# Patient Record
Sex: Male | Born: 1960
Health system: Southern US, Community
[De-identification: ages and names within clinical notes are randomized; demographics above are authoritative.]

## PROBLEM LIST (undated history)

## (undated) DIAGNOSIS — Z860100 Personal history of colon polyps, unspecified: Secondary | ICD-10-CM

## (undated) DIAGNOSIS — R519 Headache, unspecified: Secondary | ICD-10-CM

## (undated) DIAGNOSIS — R51 Headache: Secondary | ICD-10-CM

## (undated) DIAGNOSIS — I10 Essential (primary) hypertension: Secondary | ICD-10-CM

## (undated) DIAGNOSIS — M4800 Spinal stenosis, site unspecified: Secondary | ICD-10-CM

## (undated) HISTORY — DX: Personal history of colonic polyps: Z86.010

## (undated) HISTORY — DX: Headache: R51

## (undated) HISTORY — DX: Spinal stenosis, site unspecified: M48.00

## (undated) HISTORY — DX: Essential (primary) hypertension: I10

## (undated) HISTORY — DX: Headache, unspecified: R51.9

## (undated) HISTORY — DX: Personal history of colon polyps, unspecified: Z86.0100

---

## 2012-04-16 DIAGNOSIS — G894 Chronic pain syndrome: Secondary | ICD-10-CM | POA: Insufficient documentation

## 2013-01-19 DIAGNOSIS — Z0000 Encounter for general adult medical examination without abnormal findings: Secondary | ICD-10-CM | POA: Insufficient documentation

## 2013-01-20 DIAGNOSIS — E875 Hyperkalemia: Secondary | ICD-10-CM | POA: Insufficient documentation

## 2013-01-20 DIAGNOSIS — N179 Acute kidney failure, unspecified: Secondary | ICD-10-CM | POA: Insufficient documentation

## 2013-02-22 DIAGNOSIS — M7989 Other specified soft tissue disorders: Secondary | ICD-10-CM | POA: Insufficient documentation

## 2013-07-29 HISTORY — PX: CHOLECYSTECTOMY: SHX55

## 2013-08-26 DIAGNOSIS — J060 Acute laryngopharyngitis: Secondary | ICD-10-CM | POA: Insufficient documentation

## 2013-08-30 LAB — HM COLONOSCOPY

## 2013-10-01 DIAGNOSIS — R499 Unspecified voice and resonance disorder: Secondary | ICD-10-CM | POA: Insufficient documentation

## 2013-10-01 DIAGNOSIS — J312 Chronic pharyngitis: Secondary | ICD-10-CM | POA: Insufficient documentation

## 2013-10-27 DIAGNOSIS — M542 Cervicalgia: Secondary | ICD-10-CM | POA: Insufficient documentation

## 2013-12-23 DIAGNOSIS — K819 Cholecystitis, unspecified: Secondary | ICD-10-CM | POA: Insufficient documentation

## 2014-09-22 ENCOUNTER — Ambulatory Visit: Payer: Self-pay | Admitting: Pain Medicine

## 2014-10-20 ENCOUNTER — Encounter: Payer: Self-pay | Admitting: Internal Medicine

## 2014-10-20 ENCOUNTER — Telehealth: Payer: Self-pay | Admitting: Internal Medicine

## 2014-10-20 ENCOUNTER — Ambulatory Visit (INDEPENDENT_AMBULATORY_CARE_PROVIDER_SITE_OTHER): Payer: Medicare Other | Admitting: Internal Medicine

## 2014-10-20 VITALS — BP 110/70 | HR 81 | Temp 97.7°F | Wt 176.0 lb

## 2014-10-20 DIAGNOSIS — R51 Headache: Secondary | ICD-10-CM | POA: Diagnosis not present

## 2014-10-20 DIAGNOSIS — M48061 Spinal stenosis, lumbar region without neurogenic claudication: Secondary | ICD-10-CM

## 2014-10-20 DIAGNOSIS — M4806 Spinal stenosis, lumbar region: Secondary | ICD-10-CM

## 2014-10-20 DIAGNOSIS — I10 Essential (primary) hypertension: Secondary | ICD-10-CM | POA: Insufficient documentation

## 2014-10-20 DIAGNOSIS — R519 Headache, unspecified: Secondary | ICD-10-CM

## 2014-10-20 DIAGNOSIS — G4486 Cervicogenic headache: Secondary | ICD-10-CM | POA: Insufficient documentation

## 2014-10-20 MED ORDER — MORPHINE SULFATE ER 60 MG PO TBCR: 60.0000 mg | EXTENDED_RELEASE_TABLET | Freq: Two times a day (BID) | ORAL | Status: DC

## 2014-10-20 MED ORDER — OXYCODONE HCL 10 MG PO TABS: 10.0000 mg | ORAL_TABLET | Freq: Four times a day (QID) | ORAL | Status: DC

## 2014-10-20 MED ORDER — MORPHINE SULFATE ER 15 MG PO TBCR: 15.0000 mg | EXTENDED_RELEASE_TABLET | Freq: Two times a day (BID) | ORAL | Status: DC

## 2014-10-20 NOTE — Telephone Encounter (Signed)
Call pt:  I reviewed his records from [facility] re: his pain management. There were no xrays or MRI's in his chart. Do I request them from [place] or did he have them done somewhere else?

## 2014-10-20 NOTE — Progress Notes (Signed)
Pre visit review using our clinic review tool, if applicable. No additional management support is needed unless otherwise documented below in the visit note. 

## 2014-10-20 NOTE — Telephone Encounter (Signed)
emmi emailed °

## 2014-10-20 NOTE — Telephone Encounter (Signed)
Ok will try to request from where all his other records are from

## 2014-10-20 NOTE — Telephone Encounter (Signed)
Pt states he had some diagnostic studies at [facility] and here [employer] [facility]

## 2014-10-20 NOTE — Assessment & Plan Note (Signed)
He has had this evaluated and nothing has been found He tells me he brought xrays of cervical spine and shoulder Will review records and consider additional workup if needed

## 2014-10-20 NOTE — Assessment & Plan Note (Addendum)
Chronic and disabling Will review all the records her brought with him today Will refer him to pain management Will refill his medication this month, because he will run out before we can get him his pain management appt Advised him I will not be refilling this at any other time. [place] controlled substance website reviewed- appropriate

## 2014-10-20 NOTE — Progress Notes (Signed)
HPI  Pt presents to the clinic today to establish care and for management of the conditions listed below. He recently moved here from [place], [place].   Flu: 03/2014 Tetanus: < 10 years ago PSA Screening: unsure Colon Screening: 2014 Vision Screening: > 1 year ago Dentist: as needed  Frequent Headaches: Occuring daily. The headaches start in the back of his head. The pain does not radiate. The pain is sharp and stabbing. The pain is worse with neck movement and left shoulder movement. He denies sensitivity to light, sound, nausea or vomiting. He does not currently take anything OTC for his headaches.  HTN: BP well controlled on Lisinopril. He denies medication side effects. He does monitor his BP at home and reports it is never > 120/90.  Spinal Stenosis: He does not know if this is related to an injury or not. He was in the [employer] for many years. He did have an injury during that time but no specific pain in his back. 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 He has had xrays and MRI's of his back. He did bring those records with him. He was evaluated by a neurologist who advised him that surgical intervention would not be successful. He is taking Morphine and Oxycodone. He would like to have his pain medication filled here if at all possible.  Past Medical History  Diagnosis Date  . History of colon polyps   . Frequent headaches   . Hypertension   . Spinal stenosis     Current Outpatient Prescriptions  Medication Sig Dispense Refill  . baclofen (LIORESAL) 10 MG tablet Take 10 mg by mouth 2 (two) times daily.    . Cholecalciferol (VITAMIN D3) 2000 UNITS TABS Take 1 capsule by mouth daily.    . Cholecalciferol (VITAMIN D3) 5000 UNITS CAPS Take 1 capsule by mouth daily.    . diclofenac (VOLTAREN) 50 MG EC tablet Take 50 mg by mouth 2 (two) times daily.   0  . gabapentin (NEURONTIN) 600 MG tablet Take 600 mg by mouth 3 (three) times daily.    2  . lisinopril (PRINIVIL,ZESTRIL) 20 MG tablet Take 20 mg by mouth daily.   1  . LORazepam (ATIVAN) 0.5 MG tablet Take 0.5 mg by mouth at bedtime. For muscle spasms/sleep    . morphine (MS CONTIN) 15 MG 12 hr tablet Take 15 mg by mouth every 12 (twelve) hours.   0  . morphine (MS CONTIN) 60 MG 12 hr tablet Take 60 mg by mouth every 12 (twelve) hours.   0  . Oxycodone HCl 10 MG TABS Take 10 mg by mouth every 6 (six) hours.   0   No current facility-administered medications for this visit.    Allergies  Allergen Reactions  . Bee Venom Swelling    As a kid--does not remember  . Poison Ivy Extract [Extract Of Poison Ivy] Swelling    Family History  Problem Relation Age of Onset  . Cancer Mother     breast  . Alcohol abuse Father   . Heart disease Father   . Heart disease Brother   . Hypertension Brother     History   Social History  . Marital Status: Married    Spouse Name: N/A  . Number of Children: N/A  . Years of Education: N/A   Occupational History  . Not on file.   Social History Main Topics  . Smoking status: Former Smoker  Quit date: 08/15/2011  . Smokeless tobacco: Never Used  . Alcohol Use: No     Comment: sober--since 2002  . Drug Use: Not on file  . Sexual Activity: Not on file   Other Topics Concern  . Not on file   Social History Narrative  . No narrative on file    ROS:  Constitutional: Pt reports headache. Denies fever, malaise, fatigue, or abrupt weight changes.  HEENT: Denies eye pain, eye redness, ear pain, ringing in the ears, wax buildup, runny nose, nasal congestion, bloody nose, or sore throat. Respiratory: Denies difficulty breathing, shortness of breath, cough or sputum production.   Cardiovascular: Denies chest pain, chest tightness, palpitations or swelling in the hands or feet.  Musculoskeletal: Pt reports low back pain. Denies muscle pain or joint pain and swelling.  Skin: Denies redness, rashes, lesions or ulcercations.   Neurological: Denies dizziness, difficulty with memory, difficulty with speech or problems with balance and coordination.   No other specific complaints in a complete review of systems (except as listed in HPI above).  PE:  BP 110/70 mmHg  Pulse 81  Temp(Src) 97.7 F (36.5 C) (Oral)  Wt 176 lb (79.833 kg)  SpO2 96% Wt Readings from Last 3 Encounters:  10/20/14 176 lb (79.833 kg)    General: Appears his  stated age, well developed, well nourished in NAD. HEENT: Head: normal shape and size; Eyes: sclera white, no icterus, conjunctiva pink, PERRLA and EOMs intact; Cardiovascular: Normal rate and rhythm. S1,S2 noted.  No murmur, rubs or gallops noted.  Pulmonary/Chest: Normal effort and positive vesicular breath sounds. No respiratory distress. No wheezes, rales or ronchi noted.  Musculoskeletal: Decreased flexion, extension and rotation of spine. Pain with palpation of the lumbar spine. Strength 4/5 BLE. Using cane for assistance with gait. Neurological: Alert and oriented. Sensation intact to BLE.   Assessment and Plan:

## 2014-10-20 NOTE — Patient Instructions (Signed)
Spinal Stenosis Spinal stenosis is an abnormal narrowing of the canals of your spine (vertebrae). CAUSES  Spinal stenosis is caused by areas of bone pushing into the central canals of your vertebrae. This condition can be present at birth (congenital). It also may be caused by arthritic deterioration of your vertebrae (spinal degeneration).  SYMPTOMS   Pain that is generally worse with activities, particularly standing and walking.  Numbness, tingling, hot or cold sensations, weakness, or weariness in your legs.  Frequent episodes of falling.  A foot-slapping gait that leads to muscle weakness. DIAGNOSIS  Spinal stenosis is diagnosed with the use of magnetic resonance imaging (MRI) or computed tomography (CT). TREATMENT  Initial therapy for spinal stenosis focuses on the management of the pain and other symptoms associated with the condition. These therapies include:  Practicing postural changes to lessen pressure on your nerves.  Exercises to strengthen the core of your body.  Loss of excess body weight.  The use of nonsteroidal anti-inflammatory medicines to reduce swelling and inflammation in your nerves. When therapies to manage pain are not successful, surgery to treat spinal stenosis may be recommended. This surgery involves removing excess bone, which puts pressure on your nerve roots. During this surgery (laminectomy), the posterior boney arch (lamina) and excess bone around the facet joints are removed. Document Released: 10/05/2003 Document Revised: 11/29/2013 Document Reviewed: 10/23/2012 Exit[employer] Patient Information 2015 [employer]. This information is not intended to replace advice given to you by your health care provider. Make sure you discuss any questions you have with your health care provider.  

## 2014-10-20 NOTE — Assessment & Plan Note (Signed)
I think his dose is too high He is not interested in reducing his dose at this time

## 2014-10-31 NOTE — Telephone Encounter (Signed)
Mel-  Do you have his medical records release or do we need him to sigh a new one.

## 2014-11-07 ENCOUNTER — Telehealth: Payer: Self-pay | Admitting: Internal Medicine

## 2014-11-07 NOTE — Telephone Encounter (Signed)
Pt called to let us know that his pain clinic appt is 4/19.

## 2014-11-15 DIAGNOSIS — M488X6 Other specified spondylopathies, lumbar region: Secondary | ICD-10-CM | POA: Diagnosis not present

## 2014-11-15 DIAGNOSIS — G894 Chronic pain syndrome: Secondary | ICD-10-CM | POA: Diagnosis not present

## 2014-11-15 DIAGNOSIS — M542 Cervicalgia: Secondary | ICD-10-CM | POA: Diagnosis not present

## 2014-11-15 DIAGNOSIS — M545 Low back pain: Secondary | ICD-10-CM | POA: Diagnosis not present

## 2014-11-15 DIAGNOSIS — Z79899 Other long term (current) drug therapy: Secondary | ICD-10-CM | POA: Diagnosis not present

## 2014-11-18 NOTE — Telephone Encounter (Signed)
Medical record release form has been faxed to [place] fx [phone]

## 2014-11-21 ENCOUNTER — Telehealth: Payer: Self-pay | Admitting: Internal Medicine

## 2014-11-21 NOTE — Telephone Encounter (Signed)
Noted, thank you, is there anything you need me to do?

## 2014-11-21 NOTE — Telephone Encounter (Signed)
Spoke to pt at length re: his pain clinic referral and [place] medicare complete insurance plan.  Pt states that Dr. Eula Listen at [facility] is not in the preferred network and he would like to be referred to Dr. Nathanial Rancher at [facility] in [place].  I explained that Pain referrals are our most challenging especially since he has already been to two different clinics.  He will bring in records from Dr. Baruch Merl office if he would like for me to move forward with a referral to a third pain clinic.  Although Dr. Consuela Mimes and [facility] are 2 clinics in his "preferred network" he does not want to be referred to either of these offices.  He is also a patient of record this year With Dr. Primus Bravo 08/2014 / lt

## 2014-11-29 DIAGNOSIS — Z79899 Other long term (current) drug therapy: Secondary | ICD-10-CM | POA: Diagnosis not present

## 2014-11-29 DIAGNOSIS — G894 Chronic pain syndrome: Secondary | ICD-10-CM | POA: Diagnosis not present

## 2014-12-05 ENCOUNTER — Telehealth: Payer: Self-pay

## 2014-12-05 NOTE — Telephone Encounter (Signed)
[name] with [facility] left v/m that pt called their office this morning and self released himself; pt had f/u appt on 12/12/14 and pt should have approx 1 week of pain med. [facility] will not write any more pain medications.

## 2014-12-05 NOTE — Telephone Encounter (Signed)
Noted! Thank you

## 2014-12-06 ENCOUNTER — Encounter: Payer: Self-pay | Admitting: Internal Medicine

## 2014-12-06 ENCOUNTER — Ambulatory Visit (INDEPENDENT_AMBULATORY_CARE_PROVIDER_SITE_OTHER): Payer: Medicare Other | Admitting: Internal Medicine

## 2014-12-06 VITALS — BP 130/78 | HR 99 | Temp 98.5°F | Wt 179.0 lb

## 2014-12-06 DIAGNOSIS — M4806 Spinal stenosis, lumbar region: Secondary | ICD-10-CM | POA: Diagnosis not present

## 2014-12-06 DIAGNOSIS — M48061 Spinal stenosis, lumbar region without neurogenic claudication: Secondary | ICD-10-CM

## 2014-12-06 NOTE — Patient Instructions (Signed)
Spinal Stenosis Spinal stenosis is an abnormal narrowing of the canals of your spine (vertebrae). CAUSES  Spinal stenosis is caused by areas of bone pushing into the central canals of your vertebrae. This condition can be present at birth (congenital). It also may be caused by arthritic deterioration of your vertebrae (spinal degeneration).  SYMPTOMS   Pain that is generally worse with activities, particularly standing and walking.  Numbness, tingling, hot or cold sensations, weakness, or weariness in your legs.  Frequent episodes of falling.  A foot-slapping gait that leads to muscle weakness. DIAGNOSIS  Spinal stenosis is diagnosed with the use of magnetic resonance imaging (MRI) or computed tomography (CT). TREATMENT  Initial therapy for spinal stenosis focuses on the management of the pain and other symptoms associated with the condition. These therapies include:  Practicing postural changes to lessen pressure on your nerves.  Exercises to strengthen the core of your body.  Loss of excess body weight.  The use of nonsteroidal anti-inflammatory medicines to reduce swelling and inflammation in your nerves. When therapies to manage pain are not successful, surgery to treat spinal stenosis may be recommended. This surgery involves removing excess bone, which puts pressure on your nerve roots. During this surgery (laminectomy), the posterior boney arch (lamina) and excess bone around the facet joints are removed. Document Released: 10/05/2003 Document Revised: 11/29/2013 Document Reviewed: 10/23/2012 Exit[employer] Patient Information 2015 [employer]. This information is not intended to replace advice given to you by your health care provider. Make sure you discuss any questions you have with your health care provider.  

## 2014-12-06 NOTE — Progress Notes (Signed)
Pre visit review using our clinic review tool, if applicable. No additional management support is needed unless otherwise documented below in the visit note. 

## 2014-12-06 NOTE — Assessment & Plan Note (Signed)
Advised him that I will not be refilling his pain medications We discussed "doctor shopping", and he understands that this is the last referral I will place for him Referral placed to Dr. Chancy Milroy in [place] will call you with the details He has already called [facility] and told them he did not want to be there patient anymore Advised him to try the Embeda and use the Percocet until he can get it with another pain specialist I still think he would benefit from another second opinion about surgery, but he declines any referral for this at this time

## 2014-12-06 NOTE — Progress Notes (Addendum)
Subjective:    Patient ID: Samuel Burke, male    DOB: 12-May-1961, 54 y.o.   MRN: [id number]  HPI  Pt presents to the clinic today to discuss his pain management. He was referred to [facility] 09/[facility]. He had an appt with Dr. Andree Elk on 11/15/14. He reports that Dr. Andree Elk, stopped his Morphine and Oxycontin, because he felt like he was taking too much medication, and started him on Embeda and Percocet. He reports he never even picked up the Embeda and the Percocet is not effective at all. He has continued to take leftover Oxycontin and Morphine during this time and reports he will run out of those medications tomorrow. He wants a referral to a new pain mangement specialist. He also wants me to tell the new pain management specialist that he needs to stay on Morphine and Oxycontin because nothing else will work for him. He also wants me to refill his Morphine and Oxycontin today.He also tells me that he has been to 2 different surgeons in [facility], and that they both told him that surgery would not be effective in his case. Of note, I did try to request his xrays and MRI from his previous provider in [place], and they faxed back that they had no medical information on this pt. He reports that the pain is so bad at times, that he would rather be dead. He is not actively suicidal and has not come up with any plans on how he would commit suicide.  Review of Systems      Past Medical History  Diagnosis Date  . History of colon polyps   . Frequent headaches   . Hypertension   . Spinal stenosis     Current Outpatient Prescriptions  Medication Sig Dispense Refill  . baclofen (LIORESAL) 10 MG tablet Take 10 mg by mouth 2 (two) times daily.    . Cholecalciferol (VITAMIN D3) 2000 UNITS TABS Take 1 capsule by mouth daily.    . Cholecalciferol (VITAMIN D3) 5000 UNITS CAPS Take 1 capsule by mouth daily.    . diclofenac (VOLTAREN) 50 MG EC tablet Take 50 mg by mouth 2 (two) times daily.   0   . gabapentin (NEURONTIN) 600 MG tablet Take 600 mg by mouth 3 (three) times daily.   2  . lisinopril (PRINIVIL,ZESTRIL) 20 MG tablet Take 20 mg by mouth daily.   1  . LORazepam (ATIVAN) 0.5 MG tablet Take 0.5 mg by mouth at bedtime. For muscle spasms/sleep    . morphine (MS CONTIN) 15 MG 12 hr tablet Take 1 tablet (15 mg total) by mouth every 12 (twelve) hours. 60 tablet 0  . morphine (MS CONTIN) 60 MG 12 hr tablet Take 1 tablet (60 mg total) by mouth every 12 (twelve) hours. 60 tablet 0  . Oxycodone HCl 10 MG TABS Take 1 tablet (10 mg total) by mouth every 6 (six) hours. 120 tablet 0  . oxyCODONE-acetaminophen (PERCOCET) 10-325 MG per tablet Take 1 tablet by mouth 4 (four) times daily.  0  . Morphine-Naltrexone 60-2.4 MG CPCR Take 1 tablet by mouth 2 (two) times daily.     No current facility-administered medications for this visit.    Allergies  Allergen Reactions  . Bee Venom Swelling    As a kid--does not remember  . Poison Ivy Extract [Extract Of Poison Ivy] Swelling    Family History  Problem Relation Age of Onset  . Cancer Mother     breast  .  Alcohol abuse Father   . Heart disease Father   . Heart disease Brother   . Hypertension Brother     History   Social History  . Marital Status: Married    Spouse Name: N/A  . Number of Children: N/A  . Years of Education: N/A   Occupational History  . Not on file.   Social History Main Topics  . Smoking status: Former Smoker    Quit date: 08/15/2011  . Smokeless tobacco: Never Used  . Alcohol Use: No     Comment: sober--since 2002  . Drug Use: No  . Sexual Activity: Not Currently   Other Topics Concern  . Not on file   Social History Narrative     Constitutional: Denies fever, malaise, fatigue, headache or abrupt weight changes.  Respiratory: Denies difficulty breathing, shortness of breath, cough or sputum production.   Cardiovascular: Denies chest pain, chest tightness, palpitations or swelling in the hands  or feet.  Gastrointestinal: Denies abdominal pain, bloating, constipation, diarrhea or blood in the stool.  Musculoskeletal: Pt reports low back pain. Denies muscle pain or joint pain and swelling.  Neurological: Denies dizziness, difficulty with memory, difficulty with speech or problems with balance and coordination.  Psych: Denies anxiety, depression, SI/HI.  No other specific complaints in a complete review of systems (except as listed in HPI above).  Objective:   Physical Exam  BP 130/78 mmHg  Pulse 99  Temp(Src) 98.5 F (36.9 C) (Oral)  Wt 179 lb (81.194 kg)  SpO2 98% Wt Readings from Last 3 Encounters:  12/06/14 179 lb (81.194 kg)  10/20/14 176 lb (79.833 kg)    General: Appears his stated age, well developed, well nourished in NAD. Cardiovascular: Normal rate and rhythm. S1,S2 noted.  No murmur, rubs or gallops noted.  Pulmonary/Chest: Normal effort and positive vesicular breath sounds. No respiratory distress. No wheezes, rales or ronchi noted.  Musculoskeletal: Decreased flexion, extension and rotation of spine. Pain with palpation of the lumbar spine. Strength 4/5 BLE. Using cane for assistance with gait. Neurological: Alert and oriented. Sensation intact to BLE. Psychiatric: Mood and affect normal. Behavior is normal. Judgment and thought content normal.         Assessment & Plan:

## 2014-12-19 DIAGNOSIS — G8929 Other chronic pain: Secondary | ICD-10-CM | POA: Diagnosis not present

## 2014-12-19 DIAGNOSIS — F1120 Opioid dependence, uncomplicated: Secondary | ICD-10-CM | POA: Diagnosis not present

## 2014-12-19 DIAGNOSIS — M5440 Lumbago with sciatica, unspecified side: Secondary | ICD-10-CM | POA: Diagnosis not present

## 2014-12-19 DIAGNOSIS — G894 Chronic pain syndrome: Secondary | ICD-10-CM | POA: Diagnosis not present

## 2014-12-19 DIAGNOSIS — M542 Cervicalgia: Secondary | ICD-10-CM | POA: Diagnosis not present

## 2015-01-23 DIAGNOSIS — M5440 Lumbago with sciatica, unspecified side: Secondary | ICD-10-CM | POA: Diagnosis not present

## 2015-01-23 DIAGNOSIS — G894 Chronic pain syndrome: Secondary | ICD-10-CM | POA: Diagnosis not present

## 2015-01-23 DIAGNOSIS — G8929 Other chronic pain: Secondary | ICD-10-CM | POA: Diagnosis not present

## 2015-01-23 DIAGNOSIS — F1120 Opioid dependence, uncomplicated: Secondary | ICD-10-CM | POA: Diagnosis not present

## 2015-01-23 DIAGNOSIS — Z79899 Other long term (current) drug therapy: Secondary | ICD-10-CM | POA: Diagnosis not present

## 2015-01-23 DIAGNOSIS — M542 Cervicalgia: Secondary | ICD-10-CM | POA: Diagnosis not present

## 2015-01-27 ENCOUNTER — Emergency Department: Payer: Medicare Other

## 2015-01-27 ENCOUNTER — Inpatient Hospital Stay
Admission: EM | Admit: 2015-01-27 | Discharge: 2015-01-31 | DRG: 563 | Disposition: A | Discharge status: Skilled Nursing Facility | Payer: Medicare Other | Attending: Internal Medicine | Admitting: Internal Medicine

## 2015-01-27 ENCOUNTER — Inpatient Hospital Stay: Payer: Medicare Other

## 2015-01-27 ENCOUNTER — Encounter: Payer: Self-pay | Admitting: Emergency Medicine

## 2015-01-27 DIAGNOSIS — S92341A Displaced fracture of fourth metatarsal bone, right foot, initial encounter for closed fracture: Secondary | ICD-10-CM | POA: Diagnosis not present

## 2015-01-27 DIAGNOSIS — M8008XA Age-related osteoporosis with current pathological fracture, vertebra(e), initial encounter for fracture: Secondary | ICD-10-CM | POA: Diagnosis present

## 2015-01-27 DIAGNOSIS — S92321A Displaced fracture of second metatarsal bone, right foot, initial encounter for closed fracture: Secondary | ICD-10-CM | POA: Diagnosis not present

## 2015-01-27 DIAGNOSIS — G629 Polyneuropathy, unspecified: Secondary | ICD-10-CM | POA: Diagnosis not present

## 2015-01-27 DIAGNOSIS — S92322A Displaced fracture of second metatarsal bone, left foot, initial encounter for closed fracture: Secondary | ICD-10-CM | POA: Diagnosis not present

## 2015-01-27 DIAGNOSIS — W06XXXA Fall from bed, initial encounter: Secondary | ICD-10-CM | POA: Diagnosis present

## 2015-01-27 DIAGNOSIS — M6282 Rhabdomyolysis: Secondary | ICD-10-CM | POA: Diagnosis present

## 2015-01-27 DIAGNOSIS — F419 Anxiety disorder, unspecified: Secondary | ICD-10-CM | POA: Diagnosis present

## 2015-01-27 DIAGNOSIS — M4800 Spinal stenosis, site unspecified: Secondary | ICD-10-CM | POA: Diagnosis present

## 2015-01-27 DIAGNOSIS — S32048A Other fracture of fourth lumbar vertebra, initial encounter for closed fracture: Secondary | ICD-10-CM | POA: Diagnosis not present

## 2015-01-27 DIAGNOSIS — S92342D Displaced fracture of fourth metatarsal bone, left foot, subsequent encounter for fracture with routine healing: Secondary | ICD-10-CM | POA: Diagnosis not present

## 2015-01-27 DIAGNOSIS — R29898 Other symptoms and signs involving the musculoskeletal system: Secondary | ICD-10-CM | POA: Diagnosis present

## 2015-01-27 DIAGNOSIS — S92312A Displaced fracture of first metatarsal bone, left foot, initial encounter for closed fracture: Secondary | ICD-10-CM | POA: Diagnosis not present

## 2015-01-27 DIAGNOSIS — R296 Repeated falls: Secondary | ICD-10-CM | POA: Diagnosis present

## 2015-01-27 DIAGNOSIS — Z87891 Personal history of nicotine dependence: Secondary | ICD-10-CM | POA: Diagnosis not present

## 2015-01-27 DIAGNOSIS — R278 Other lack of coordination: Secondary | ICD-10-CM | POA: Diagnosis not present

## 2015-01-27 DIAGNOSIS — I10 Essential (primary) hypertension: Secondary | ICD-10-CM | POA: Diagnosis present

## 2015-01-27 DIAGNOSIS — Z808 Family history of malignant neoplasm of other organs or systems: Secondary | ICD-10-CM | POA: Diagnosis not present

## 2015-01-27 DIAGNOSIS — N179 Acute kidney failure, unspecified: Secondary | ICD-10-CM | POA: Diagnosis present

## 2015-01-27 DIAGNOSIS — T796XXA Traumatic ischemia of muscle, initial encounter: Secondary | ICD-10-CM | POA: Diagnosis not present

## 2015-01-27 DIAGNOSIS — M4806 Spinal stenosis, lumbar region: Secondary | ICD-10-CM | POA: Diagnosis not present

## 2015-01-27 DIAGNOSIS — Z9049 Acquired absence of other specified parts of digestive tract: Secondary | ICD-10-CM | POA: Diagnosis not present

## 2015-01-27 DIAGNOSIS — S92324A Nondisplaced fracture of second metatarsal bone, right foot, initial encounter for closed fracture: Secondary | ICD-10-CM | POA: Diagnosis not present

## 2015-01-27 DIAGNOSIS — Z91030 Bee allergy status: Secondary | ICD-10-CM | POA: Diagnosis not present

## 2015-01-27 DIAGNOSIS — S199XXA Unspecified injury of neck, initial encounter: Secondary | ICD-10-CM | POA: Diagnosis not present

## 2015-01-27 DIAGNOSIS — Z809 Family history of malignant neoplasm, unspecified: Secondary | ICD-10-CM

## 2015-01-27 DIAGNOSIS — Z8249 Family history of ischemic heart disease and other diseases of the circulatory system: Secondary | ICD-10-CM | POA: Diagnosis not present

## 2015-01-27 DIAGNOSIS — S92322D Displaced fracture of second metatarsal bone, left foot, subsequent encounter for fracture with routine healing: Secondary | ICD-10-CM | POA: Diagnosis not present

## 2015-01-27 DIAGNOSIS — S3992XA Unspecified injury of lower back, initial encounter: Secondary | ICD-10-CM | POA: Diagnosis not present

## 2015-01-27 DIAGNOSIS — S92331A Displaced fracture of third metatarsal bone, right foot, initial encounter for closed fracture: Secondary | ICD-10-CM | POA: Diagnosis not present

## 2015-01-27 DIAGNOSIS — S92332A Displaced fracture of third metatarsal bone, left foot, initial encounter for closed fracture: Secondary | ICD-10-CM | POA: Diagnosis not present

## 2015-01-27 DIAGNOSIS — S92342A Displaced fracture of fourth metatarsal bone, left foot, initial encounter for closed fracture: Secondary | ICD-10-CM | POA: Diagnosis present

## 2015-01-27 DIAGNOSIS — R51 Headache: Secondary | ICD-10-CM | POA: Diagnosis present

## 2015-01-27 DIAGNOSIS — S92902A Unspecified fracture of left foot, initial encounter for closed fracture: Secondary | ICD-10-CM

## 2015-01-27 DIAGNOSIS — Z803 Family history of malignant neoplasm of breast: Secondary | ICD-10-CM

## 2015-01-27 DIAGNOSIS — S92344A Nondisplaced fracture of fourth metatarsal bone, right foot, initial encounter for closed fracture: Secondary | ICD-10-CM | POA: Diagnosis not present

## 2015-01-27 DIAGNOSIS — Z86010 Personal history of colonic polyps: Secondary | ICD-10-CM | POA: Diagnosis not present

## 2015-01-27 DIAGNOSIS — S32048D Other fracture of fourth lumbar vertebra, subsequent encounter for fracture with routine healing: Secondary | ICD-10-CM | POA: Diagnosis not present

## 2015-01-27 DIAGNOSIS — W1830XA Fall on same level, unspecified, initial encounter: Secondary | ICD-10-CM | POA: Diagnosis present

## 2015-01-27 DIAGNOSIS — S92332D Displaced fracture of third metatarsal bone, left foot, subsequent encounter for fracture with routine healing: Secondary | ICD-10-CM | POA: Diagnosis not present

## 2015-01-27 DIAGNOSIS — S22088A Other fracture of T11-T12 vertebra, initial encounter for closed fracture: Secondary | ICD-10-CM | POA: Diagnosis not present

## 2015-01-27 DIAGNOSIS — S92909A Unspecified fracture of unspecified foot, initial encounter for closed fracture: Secondary | ICD-10-CM | POA: Diagnosis not present

## 2015-01-27 DIAGNOSIS — Z811 Family history of alcohol abuse and dependence: Secondary | ICD-10-CM

## 2015-01-27 DIAGNOSIS — S0990XA Unspecified injury of head, initial encounter: Secondary | ICD-10-CM | POA: Diagnosis not present

## 2015-01-27 DIAGNOSIS — M545 Low back pain: Secondary | ICD-10-CM | POA: Diagnosis not present

## 2015-01-27 DIAGNOSIS — S22080A Wedge compression fracture of T11-T12 vertebra, initial encounter for closed fracture: Secondary | ICD-10-CM | POA: Diagnosis not present

## 2015-01-27 DIAGNOSIS — S299XXA Unspecified injury of thorax, initial encounter: Secondary | ICD-10-CM | POA: Diagnosis not present

## 2015-01-27 DIAGNOSIS — R220 Localized swelling, mass and lump, head: Secondary | ICD-10-CM | POA: Diagnosis not present

## 2015-01-27 DIAGNOSIS — Z9181 History of falling: Secondary | ICD-10-CM | POA: Diagnosis not present

## 2015-01-27 DIAGNOSIS — Z79891 Long term (current) use of opiate analgesic: Secondary | ICD-10-CM | POA: Diagnosis not present

## 2015-01-27 DIAGNOSIS — M6281 Muscle weakness (generalized): Secondary | ICD-10-CM | POA: Diagnosis not present

## 2015-01-27 DIAGNOSIS — F1120 Opioid dependence, uncomplicated: Secondary | ICD-10-CM | POA: Diagnosis not present

## 2015-01-27 DIAGNOSIS — Y92003 Bedroom of unspecified non-institutional (private) residence as the place of occurrence of the external cause: Secondary | ICD-10-CM

## 2015-01-27 DIAGNOSIS — S92325A Nondisplaced fracture of second metatarsal bone, left foot, initial encounter for closed fracture: Secondary | ICD-10-CM | POA: Diagnosis not present

## 2015-01-27 DIAGNOSIS — S93325A Dislocation of tarsometatarsal joint of left foot, initial encounter: Secondary | ICD-10-CM

## 2015-01-27 DIAGNOSIS — M4850XA Collapsed vertebra, not elsewhere classified, site unspecified, initial encounter for fracture: Secondary | ICD-10-CM | POA: Diagnosis not present

## 2015-01-27 DIAGNOSIS — M62838 Other muscle spasm: Secondary | ICD-10-CM | POA: Diagnosis not present

## 2015-01-27 DIAGNOSIS — T148 Other injury of unspecified body region: Secondary | ICD-10-CM | POA: Diagnosis not present

## 2015-01-27 LAB — CBC WITH DIFFERENTIAL/PLATELET
BASOS ABS: 0.1 10*3/uL (ref 0–0.1)
Basophils Relative: 1 %
EOS ABS: 0 10*3/uL (ref 0–0.7)
EOS PCT: 0 %
HEMATOCRIT: 40.1 % (ref 40.0–52.0)
HEMOGLOBIN: 13.4 g/dL (ref 13.0–18.0)
LYMPHS ABS: 1.4 10*3/uL (ref 1.0–3.6)
LYMPHS PCT: 9 %
MCH: 30.5 pg (ref 26.0–34.0)
MCHC: 33.4 g/dL (ref 32.0–36.0)
MCV: 91.1 fL (ref 80.0–100.0)
Monocytes Absolute: 1 10*3/uL (ref 0.2–1.0)
Monocytes Relative: 6 %
NEUTROS ABS: 13.1 10*3/uL — AB (ref 1.4–6.5)
NEUTROS PCT: 84 %
PLATELETS: 196 10*3/uL (ref 150–440)
RBC: 4.4 MIL/uL (ref 4.40–5.90)
RDW: 13.1 % (ref 11.5–14.5)
WBC: 15.6 10*3/uL — ABNORMAL HIGH (ref 3.8–10.6)

## 2015-01-27 LAB — URINE DRUG SCREEN, QUALITATIVE (ARMC ONLY)
Amphetamines, Ur Screen: NOT DETECTED
Barbiturates, Ur Screen: NOT DETECTED
Benzodiazepine, Ur Scrn: NOT DETECTED
CANNABINOID 50 NG, UR ~~LOC~~: NOT DETECTED
Cocaine Metabolite,Ur ~~LOC~~: NOT DETECTED
MDMA (Ecstasy)Ur Screen: NOT DETECTED
Methadone Scn, Ur: NOT DETECTED
OPIATE, UR SCREEN: POSITIVE — AB
PHENCYCLIDINE (PCP) UR S: NOT DETECTED
TRICYCLIC, UR SCREEN: NOT DETECTED

## 2015-01-27 LAB — TYPE AND SCREEN
ABO/RH(D): O POS
Antibody Screen: NEGATIVE

## 2015-01-27 LAB — SEDIMENTATION RATE: Sed Rate: 17 mm/hr (ref 0–20)

## 2015-01-27 LAB — ABO/RH: ABO/RH(D): O POS

## 2015-01-27 LAB — BASIC METABOLIC PANEL
Anion gap: 9 (ref 5–15)
BUN: 37 mg/dL — ABNORMAL HIGH (ref 6–20)
CALCIUM: 9.6 mg/dL (ref 8.9–10.3)
CO2: 25 mmol/L (ref 22–32)
CREATININE: 1.97 mg/dL — AB (ref 0.61–1.24)
Chloride: 103 mmol/L (ref 101–111)
GFR calc Af Amer: 42 mL/min — ABNORMAL LOW (ref >60)
GFR, EST NON AFRICAN AMERICAN: 36 mL/min — AB (ref >60)
GLUCOSE: 111 mg/dL — AB (ref 65–99)
POTASSIUM: 5.5 mmol/L — AB (ref 3.5–5.1)
SODIUM: 137 mmol/L (ref 135–145)

## 2015-01-27 LAB — TSH: TSH: 0.475 u[IU]/mL (ref 0.350–4.500)

## 2015-01-27 LAB — CK: Total CK: 7214 U/L — ABNORMAL HIGH (ref 49–397)

## 2015-01-27 MED ORDER — ONDANSETRON HCL 4 MG/2ML IJ SOLN
INTRAMUSCULAR | Status: AC
  Administered 2015-01-27: 4 mg via INTRAVENOUS
  Filled 2015-01-27: qty 2

## 2015-01-27 MED ORDER — TETANUS-DIPHTHERIA TOXOIDS TD 5-2 LFU IM INJ
INJECTION | INTRAMUSCULAR | Status: AC
  Administered 2015-01-27: 0.5 mL via INTRAMUSCULAR
  Filled 2015-01-27: qty 0.5

## 2015-01-27 MED ORDER — ONDANSETRON HCL 4 MG PO TABS: 4.0000 mg | ORAL_TABLET | Freq: Four times a day (QID) | ORAL | Status: DC | PRN

## 2015-01-27 MED ORDER — SODIUM CHLORIDE 0.9 % IV BOLUS (SEPSIS)
1000.0000 mL | Freq: Once | INTRAVENOUS | Status: AC
  Administered 2015-01-27: 1000 mL via INTRAVENOUS

## 2015-01-27 MED ORDER — MORPHINE SULFATE ER 30 MG PO TBCR
60.0000 mg | EXTENDED_RELEASE_TABLET | Freq: Two times a day (BID) | ORAL | Status: DC
  Administered 2015-01-27 – 2015-01-28 (×2): 60 mg via ORAL
  Filled 2015-01-27 (×2): qty 2

## 2015-01-27 MED ORDER — VITAMIN D3 25 MCG (1000 UNIT) PO TABS
5000.0000 [IU] | ORAL_TABLET | Freq: Every day | ORAL | Status: DC
  Administered 2015-01-28 – 2015-01-31 (×4): 5000 [IU] via ORAL
  Filled 2015-01-27 (×10): qty 5

## 2015-01-27 MED ORDER — LISINOPRIL 20 MG PO TABS
20.0000 mg | ORAL_TABLET | Freq: Every day | ORAL | Status: DC
Start: 2015-01-27 — End: 2015-01-27

## 2015-01-27 MED ORDER — ONDANSETRON HCL 4 MG/2ML IJ SOLN
4.0000 mg | Freq: Four times a day (QID) | INTRAMUSCULAR | Status: DC | PRN
  Administered 2015-01-27 – 2015-01-28 (×3): 4 mg via INTRAVENOUS
  Filled 2015-01-27 (×2): qty 2

## 2015-01-27 MED ORDER — VITAMIN D 1000 UNITS PO TABS
2000.0000 [IU] | ORAL_TABLET | Freq: Every day | ORAL | Status: DC
  Administered 2015-01-28 – 2015-01-31 (×4): 2000 [IU] via ORAL
  Filled 2015-01-27 (×7): qty 2

## 2015-01-27 MED ORDER — SODIUM CHLORIDE 0.9 % IV SOLN
INTRAVENOUS | Status: DC
  Administered 2015-01-27 – 2015-01-29 (×5): via INTRAVENOUS

## 2015-01-27 MED ORDER — GABAPENTIN 600 MG PO TABS
600.0000 mg | ORAL_TABLET | Freq: Three times a day (TID) | ORAL | Status: DC
  Administered 2015-01-27 – 2015-01-31 (×11): 600 mg via ORAL
  Filled 2015-01-27 (×12): qty 1

## 2015-01-27 MED ORDER — MORPHINE SULFATE 4 MG/ML IJ SOLN
4.0000 mg | Freq: Once | INTRAMUSCULAR | Status: AC
Start: 2015-01-27 — End: 2015-01-27
  Administered 2015-01-27: 4 mg via INTRAVENOUS

## 2015-01-27 MED ORDER — ONDANSETRON HCL 4 MG/2ML IJ SOLN
4.0000 mg | Freq: Once | INTRAMUSCULAR | Status: AC
  Administered 2015-01-27: 4 mg via INTRAVENOUS

## 2015-01-27 MED ORDER — BACLOFEN 10 MG PO TABS
10.0000 mg | ORAL_TABLET | Freq: Two times a day (BID) | ORAL | Status: DC
Start: 2015-01-27 — End: 2015-01-31
  Administered 2015-01-27 – 2015-01-31 (×8): 10 mg via ORAL
  Filled 2015-01-27 (×8): qty 1

## 2015-01-27 MED ORDER — ENOXAPARIN SODIUM 40 MG/0.4ML ~~LOC~~ SOLN
40.0000 mg | SUBCUTANEOUS | Status: DC
  Administered 2015-01-27 – 2015-01-30 (×4): 40 mg via SUBCUTANEOUS
  Filled 2015-01-27 (×4): qty 0.4

## 2015-01-27 MED ORDER — MORPHINE SULFATE 4 MG/ML IJ SOLN
INTRAMUSCULAR | Status: AC
  Administered 2015-01-27: 4 mg via INTRAVENOUS
  Filled 2015-01-27: qty 1

## 2015-01-27 MED ORDER — OXYCODONE HCL 5 MG PO TABS
10.0000 mg | ORAL_TABLET | Freq: Four times a day (QID) | ORAL | Status: DC
  Filled 2015-01-27: qty 2

## 2015-01-27 MED ORDER — LORAZEPAM 2 MG/ML IJ SOLN
INTRAMUSCULAR | Status: AC
  Filled 2015-01-27: qty 1

## 2015-01-27 MED ORDER — LORAZEPAM 0.5 MG PO TABS
0.5000 mg | ORAL_TABLET | Freq: Every evening | ORAL | Status: DC | PRN
Start: 2015-01-27 — End: 2015-01-31

## 2015-01-27 MED ORDER — TETANUS-DIPHTHERIA TOXOIDS TD 5-2 LFU IM INJ
0.5000 mL | INJECTION | Freq: Once | INTRAMUSCULAR | Status: AC
  Administered 2015-01-27: 0.5 mL via INTRAMUSCULAR

## 2015-01-27 MED ORDER — LORAZEPAM 2 MG/ML IJ SOLN: 1.0000 mg | Freq: Once | INTRAMUSCULAR | Status: DC

## 2015-01-27 NOTE — ED Provider Notes (Signed)
[facility] Emergency Department Provider Note  ____________________________________________  Time seen: Upon arrival to the emergency department.  I have reviewed the triage vital signs and the nursing notes.   HISTORY  Chief Complaint Fall    HPI Samuel Burke is a 54 y.o. male with a history of spinal stenosis who says he had multiple falls last night because of legs giving out on him. He says that he was not able to get up and eventually had to pull himself over the phone which he used call 911. Sustained several abrasions to the forehead. Denies any loss of bowel or bladder continence. Says does have chronic weakness to his bilateral lower extremities but has been worse since last night. Also now with increased pain in the midline T-spine. Sees pain control as an outpatient.   Past Medical History  Diagnosis Date  . History of colon polyps   . Frequent headaches   . Hypertension   . Spinal stenosis     Patient Active Problem List   Diagnosis Date Noted  . Frequent headaches 10/20/2014  . Essential hypertension 10/20/2014  . Spinal stenosis of lumbar region 10/20/2014    Past Surgical History  Procedure Laterality Date  . Cholecystectomy  2015    Current Outpatient Rx  Name  Route  Sig  Dispense  Refill  . baclofen (LIORESAL) 10 MG tablet   Oral   Take 10 mg by mouth 2 (two) times daily.         . Cholecalciferol (RA VITAMIN D-3) 2000 UNITS CAPS   Oral   Take 1 capsule by mouth daily. Pt takes with a 5,000 unit capsule.         . Cholecalciferol (VITAMIN D3) 5000 UNITS CAPS   Oral   Take 1 capsule by mouth daily. Pt takes with a 2,000 unit capsule.         . diclofenac (VOLTAREN) 50 MG EC tablet   Oral   Take 50 mg by mouth 2 (two) times daily.         Marland Kitchen gabapentin (NEURONTIN) 600 MG tablet   Oral   Take 600 mg by mouth 3 (three) times daily.         Marland Kitchen lisinopril (PRINIVIL,ZESTRIL) 20 MG tablet   Oral   Take 20 mg  by mouth at bedtime.         Marland Kitchen morphine (MS CONTIN) 60 MG 12 hr tablet   Oral   Take 1 tablet (60 mg total) by mouth every 12 (twelve) hours. Patient taking differently: Take 60 mg by mouth every 8 (eight) hours.    60 tablet   0     Fill on or after 10/31/14   . LORazepam (ATIVAN) 0.5 MG tablet   Oral   Take 0.5 mg by mouth at bedtime as needed for sleep.          Marland Kitchen morphine (MS CONTIN) 15 MG 12 hr tablet   Oral   Take 1 tablet (15 mg total) by mouth every 12 (twelve) hours. Patient not taking: Reported on 01/27/2015   60 tablet   0     Fill on or after 10/31/14   . Oxycodone HCl 10 MG TABS   Oral   Take 1 tablet (10 mg total) by mouth every 6 (six) hours. Patient not taking: Reported on 01/27/2015   120 tablet   0     Fill on or after 10/31/14     Allergies Bee venom  and Poison ivy extract  Family History  Problem Relation Age of Onset  . Cancer Mother     breast  . Alcohol abuse Father   . Heart disease Father   . Heart disease Brother   . Hypertension Brother     Social History History  Substance Use Topics  . Smoking status: Former Smoker    Quit date: 08/15/2011  . Smokeless tobacco: Never Used  . Alcohol Use: No     Comment: sober--since 2002    Review of Systems Constitutional: No fever/chills Eyes: No visual changes. ENT: No sore throat. Cardiovascular: Denies chest pain. Respiratory: Denies shortness of breath. Gastrointestinal: No abdominal pain.  No nausea, no vomiting.  No diarrhea.  No constipation. Genitourinary: Negative for dysuria. Musculoskeletal: As above  Skin: Negative for rash. Neurological: Negative for headaches or numbness.  10-point ROS otherwise negative.  ____________________________________________   PHYSICAL EXAM:  VITAL SIGNS: ED Triage Vitals  Enc Vitals Group     BP 01/27/15 1107 126/95 mmHg     Pulse Rate 01/27/15 1107 114     Resp 01/27/15 1107 16     Temp 01/27/15 1107 98.2 F (36.8 C)     Temp Source  01/27/15 1107 Oral     SpO2 01/27/15 1107 97 %     Weight 01/27/15 1107 174 lb (78.926 kg)     Height 01/27/15 1107 5\' 6"  (1.676 m)     Head Cir --      Peak Flow --      Pain Score 01/27/15 1108 10     Pain Loc --      Pain Edu? --      Excl. in [place]? --     Constitutional: Alert and oriented.  in no acute distress. Eyes: Conjunctivae are normal. PERRL. EOMI. Head: Atraumatic. Nose: No congestion/rhinnorhea. Mouth/Throat: Mucous membranes are moist.  Oropharynx non-erythematous. Neck: No stridor.  In c-collar and on backboard. No tenderness to the C-spine. No step-off or deformity. Cardiovascular: Normal rate, regular rhythm. Grossly normal heart sounds.  Good peripheral circulation. Respiratory: Normal respiratory effort.  No retractions. Lungs CTAB. Gastrointestinal: Soft and nontender. No distention. No abdominal bruits. No CVA tenderness. Musculoskeletal: No lower extremity tenderness nor edema.  No joint effusions. Left forefoot with ecchymosis and mild edema. No saddle anesthesia. There is no step-off or deformity to the T or L-spine. There is tenderness diffusely midline to the T-spine. Neurologic:  Normal speech and language. Weak 4 out of 5 strength to bilateral lower extremities.  Skin:  Skin is warm, dry . Superficial abrasion to the forehead which is 3 cm and horizontal. There is no active bleeding.  Psychiatric: Mood and affect are normal. Speech and behavior are normal.  ____________________________________________   LABS (all labs ordered are listed, but only abnormal results are displayed)  Labs Reviewed  CBC WITH DIFFERENTIAL/PLATELET - Abnormal; Notable for the following:    WBC 15.6 (*)    Neutro Abs 13.1 (*)    All other components within normal limits  BASIC METABOLIC PANEL - Abnormal; Notable for the following:    Potassium 5.5 (*)    Glucose, Bld 111 (*)    BUN 37 (*)    Creatinine, Ser 1.97 (*)    GFR calc non Af Amer 36 (*)    GFR calc Af Amer 42 (*)     All other components within normal limits  CK - Abnormal; Notable for the following:    Total CK 7214 (*)  All other components within normal limits  TYPE AND SCREEN  ABO/RH   ____________________________________________  EKG  ED ECG REPORT I, Ronika Kelson,  Youlanda Roys, the attending physician, personally viewed and interpreted this ECG.   Date: 01/27/2015  EKG Time: 1103  Rate: 117  Rhythm: sinus tachycardia  Axis: Normal axis  Intervals:none  ST&T Change: No ST elevations or depressions. Biphasic T waves in V3 through 6.  ____________________________________________  RADIOLOGY  CT without any acute intracranial abnormality. No acute bony abnormality to the C-spine.   Low-volume chest x-ray. I personally viewed the chest x-ray. Thoracic spine with mild depression of the upper endplate of C58. Lumbar x-ray with no fracture or acute finding. Pelvis x-ray NAD. Lisfranc fracture of the left foot. ____________________________________________   PROCEDURES  ____________________________________________   INITIAL IMPRESSION / ASSESSMENT AND PLAN / ED COURSE  Pertinent labs & imaging results that were available during my care of the patient were reviewed by me and considered in my medical decision making (see chart for details).  ----------------------------------------- 4:04 PM on 01/27/2015 -----------------------------------------  Discussed the case with the patient and his sister after the x-rays were completed. I told him that I was concerned because of his T11 fracture and weakness in his bilateral lower extremities. I told him that he would likely need to be transferred to a trauma center and the family requested [facility] in [place].  Discussed the case with Dr. Hulen Skains at [facility] trauma service. Discussed the case with his orthopedic surgeon and they do not feel that the fracture will be causing the patient's weakness by spinal cord compression. I discussed this with the  family and the need for an MRI which has been ordered.  Patient left lower extremity to be splinted. CK resulted at 7200. Signed out to Dr. Cinda Quest for follow-up of the imaging and admission. ____________________________________________   FINAL CLINICAL IMPRESSION(S) / ED DIAGNOSES  Final diagnoses:  Vertebral fracture, osteoporotic, initial encounter  Foot fracture, left, closed, initial encounter  Weakness of both legs  Weakness of both legs   Acute rhabdomyolysis. Initial visit.   Orbie Pyo, MD 01/27/15 [phone]

## 2015-01-27 NOTE — ED Notes (Signed)
Back from ct.

## 2015-01-27 NOTE — H&P (Signed)
[facility] NAME: Samuel Burke    MR#:  [id number]  DATE OF BIRTH:  [birth year]  DATE OF ADMISSION:  01/27/2015  PRIMARY CARE PHYSICIAN: Webb Silversmith, NP   REQUESTING/REFERRING PHYSICIAN: Tresa Endo  CHIEF COMPLAINT:   Chief Complaint  Patient presents with  . Fall    HISTORY OF PRESENT ILLNESS: Samuel Burke  is a 54 y.o. male with a known history of hypertension, spinal stenosis and frequent headaches who reports that he was in his usual state of health until yesterday when he started falling. He reports that the last time he fell was 2 years ago. He has not had any difficulties with walking. To note he is on multiple pain medications and on anxiety medication. Patient came to the ER with multiple falls and had MRI of his lumbar spine and thoracic spine, also had a complete left foot x-ray which showed lisfranc fracture at the base of the second metatarsal and fracture of the base of the fourth metatarsal. The ED physician discussed the case with orthopedics Dr. Mack Guise who recommended nonweightbearing to the foot and outpatient follow-up. Patient also noted to have very high CPK with rhabdo therefore we are asked to admit the patient. He denies any fevers chills no chest pain no shortness of breath  PAST MEDICAL HISTORY:   Past Medical History  Diagnosis Date  . History of colon polyps   . Frequent headaches   . Hypertension   . Spinal stenosis     PAST SURGICAL HISTORY:  Past Surgical History  Procedure Laterality Date  . Cholecystectomy  2015    SOCIAL HISTORY:  History  Substance Use Topics  . Smoking status: Former Smoker    Quit date: 08/15/2011  . Smokeless tobacco: Never Used  . Alcohol Use: No     Comment: sober--since 2002    FAMILY HISTORY:  Family History  Problem Relation Age of Onset  . Cancer Mother     breast  . Alcohol abuse Father   . Heart disease Father   . Heart disease  Brother   . Hypertension Brother     DRUG ALLERGIES:  Allergies  Allergen Reactions  . Bee Venom Swelling  . Poison Ivy Extract [Extract Of Poison Ivy] Swelling    REVIEW OF SYSTEMS:   CONSTITUTIONAL: No fever, positive fatigue positive weakness.  EYES: No blurred or double vision.  EARS, NOSE, AND THROAT: No tinnitus or ear pain.  RESPIRATORY: No cough, shortness of breath, wheezing or hemoptysis.  CARDIOVASCULAR: No chest pain, orthopnea, edema.  GASTROINTESTINAL: Chronic nausea, vomiting, diarrhea or abdominal pain.  GENITOURINARY: No dysuria, hematuria.  ENDOCRINE: No polyuria, nocturia,  HEMATOLOGY: No anemia, easy bruising or bleeding SKIN: No rash or lesion. MUSCULOSKELETAL: Pain in his back and foot NEUROLOGIC: No tingling, numbness, weakness.  PSYCHIATRY: No anxiety or depression.   MEDICATIONS AT HOME:  Prior to Admission medications   Medication Sig Start Date End Date Taking? Authorizing Provider  baclofen (LIORESAL) 10 MG tablet Take 10 mg by mouth 2 (two) times daily.   Yes Historical Provider, MD  Cholecalciferol (RA VITAMIN D-3) 2000 UNITS CAPS Take 1 capsule by mouth daily. Pt takes with a 5,000 unit capsule.   Yes Historical Provider, MD  Cholecalciferol (VITAMIN D3) 5000 UNITS CAPS Take 1 capsule by mouth daily. Pt takes with a 2,000 unit capsule.   Yes Historical Provider, MD  diclofenac (VOLTAREN) 50 MG EC tablet Take 50 mg by mouth  2 (two) times daily.   Yes Historical Provider, MD  gabapentin (NEURONTIN) 600 MG tablet Take 600 mg by mouth 3 (three) times daily.   Yes Historical Provider, MD  lisinopril (PRINIVIL,ZESTRIL) 20 MG tablet Take 20 mg by mouth at bedtime.   Yes Historical Provider, MD  morphine (MS CONTIN) 60 MG 12 hr tablet Take 1 tablet (60 mg total) by mouth every 12 (twelve) hours. Patient taking differently: Take 60 mg by mouth every 8 (eight) hours.  10/20/14  Yes Jearld Fenton, NP  LORazepam (ATIVAN) 0.5 MG tablet Take 0.5 mg by mouth at  bedtime as needed for sleep.     Historical Provider, MD  morphine (MS CONTIN) 15 MG 12 hr tablet Take 1 tablet (15 mg total) by mouth every 12 (twelve) hours. Patient not taking: Reported on 01/27/2015 10/20/14   Jearld Fenton, NP  Oxycodone HCl 10 MG TABS Take 1 tablet (10 mg total) by mouth every 6 (six) hours. Patient not taking: Reported on 01/27/2015 10/20/14   Jearld Fenton, NP      PHYSICAL EXAMINATION:   VITAL SIGNS: Blood pressure 138/88, pulse 119, temperature 98.2 F (36.8 C), temperature source Oral, resp. rate 11, height 5' 6"  (1.676 m), weight 78.926 kg (174 lb), SpO2 93 %.  GENERAL:  54 y.o.-year-old patient lying in the bed with no acute distress.  EYES: Pupils equal, round, reactive to light and accommodation. No scleral icterus. Extraocular muscles intact.  HEENT: Head atraumatic, normocephalic. Oropharynx and nasopharynx clear.  NECK:  Supple, no jugular venous distention. No thyroid enlargement, no tenderness.  LUNGS: Normal breath sounds bilaterally, no wheezing, rales,rhonchi or crepitation. No use of accessory muscles of respiration.  CARDIOVASCULAR: S1, S2 normal. No murmurs, rubs, or gallops.  ABDOMEN: Soft, nontender, nondistended. Bowel sounds present. No organomegaly or mass.  EXTREMITIES: No pedal edema, cyanosis, or clubbing.  NEUROLOGIC: Cranial nerves II through XII are intact. Muscle strength 5/5 in all extremities. Sensation intact. Gait not checked.  PSYCHIATRIC: The patient is alert and oriented x 3.  SKIN: Bruising on both of his feet. Abrasions on his forehead  LABORATORY PANEL:   CBC  Recent Labs Lab 01/27/15 1118  WBC 15.6*  HGB 13.4  HCT 40.1  PLT 196  MCV 91.1  MCH 30.5  MCHC 33.4  RDW 13.1  LYMPHSABS 1.4  MONOABS 1.0  EOSABS 0.0  BASOSABS 0.1   ------------------------------------------------------------------------------------------------------------------  Chemistries   Recent Labs Lab 01/27/15 1118  NA 137  K 5.5*  CL  103  CO2 25  GLUCOSE 111*  BUN 37*  CREATININE 1.97*  CALCIUM 9.6   ------------------------------------------------------------------------------------------------------------------ estimated creatinine clearance is 41.8 mL/min (by C-G formula based on Cr of 1.97). ------------------------------------------------------------------------------------------------------------------ No results for input(s): TSH, T4TOTAL, T3FREE, THYROIDAB in the last 72 hours.  Invalid input(s): FREET3   Coagulation profile No results for input(s): INR, PROTIME in the last 168 hours. ------------------------------------------------------------------------------------------------------------------- No results for input(s): DDIMER in the last 72 hours. -------------------------------------------------------------------------------------------------------------------  Cardiac Enzymes No results for input(s): CKMB, TROPONINI, MYOGLOBIN in the last 168 hours.  Invalid input(s): CK ------------------------------------------------------------------------------------------------------------------ Invalid input(s): POCBNP  ---------------------------------------------------------------------------------------------------------------  Urinalysis No results found for: COLORURINE, APPEARANCEUR, LABSPEC, PHURINE, GLUCOSEU, HGBUR, BILIRUBINUR, KETONESUR, PROTEINUR, UROBILINOGEN, NITRITE, LEUKOCYTESUR   RADIOLOGY: Dg Chest 1 View  01/27/2015   CLINICAL DATA:  fall, weakness in bilateral le  EXAM: CHEST  1 VIEW  COMPARISON:  None.  FINDINGS: Low volume chest. Bilateral basilar atelectasis. The cardiopericardial silhouette appears enlarged, out with side accentuated by  low lung volumes. There is no pneumothorax. Thoracic vertebral body height appears preserved. Cholecystectomy clips are present in the right upper quadrant.  IMPRESSION: Low volume chest. Enlarged cardiopericardial silhouette, likely secondary to low  volumes.   Electronically Signed   By: Dereck Ligas M.D.   On: 01/27/2015 13:35   Dg Thoracic Spine 2 View  01/27/2015   CLINICAL DATA:  Fall.  Lower extremity weakness.  EXAM: THORACIC SPINE - 2-3 VIEWS  COMPARISON:  None.  FINDINGS: There is mild depression of the upper endplate of X10. No other evidence of a fracture. No spondylolisthesis. There are minor disc degenerative changes reflected by mild loss of disc height and small endplate osteophytes mostly along the mid thoracic spine.  Soft tissues are unremarkable.  IMPRESSION: 1. Mild depression of the upper endplates of G26 which may reflect a fracture. This could be recent or remote. 2. No other evidence of a fracture.   Electronically Signed   By: Lajean Manes M.D.   On: 01/27/2015 13:35   Dg Lumbar Spine Complete  01/27/2015   CLINICAL DATA:  Fall.  Lower extremity weakness.  EXAM: LUMBAR SPINE - COMPLETE 4+ VIEW  COMPARISON:  None.  FINDINGS: No fracture of the lumbar spine. No spondylolisthesis. Mild loss of disc height from the lower thoracic spine through L4-L5. There are small endplate osteophytes.  Soft tissues are unremarkable.  IMPRESSION: No fracture or acute finding.   Electronically Signed   By: Lajean Manes M.D.   On: 01/27/2015 13:36   Dg Pelvis 1-2 Views  01/27/2015   CLINICAL DATA:  Follow-up. Bilateral lower extremity weakness. Initial encounter.  EXAM: PELVIS - 1-2 VIEW  COMPARISON:  None.  FINDINGS: Pelvic rings appear intact. Proximal femur appears normal bilaterally. Sacral arcades normal. Phleboliths in the anatomic pelvis. Hip joint spaces are symmetric.  IMPRESSION: No acute osseous abnormality.   Electronically Signed   By: Dereck Ligas M.D.   On: 01/27/2015 13:37   Ct Head Wo Contrast  01/27/2015   CLINICAL DATA:  Fall last night, bilateral weakness.  EXAM: CT HEAD WITHOUT CONTRAST  CT CERVICAL SPINE WITHOUT CONTRAST  TECHNIQUE: Multidetector CT imaging of the head and cervical spine was performed following the  standard protocol without intravenous contrast. Multiplanar CT image reconstructions of the cervical spine were also generated.  COMPARISON:  None.  FINDINGS: CT HEAD FINDINGS  Soft tissue swelling over the right forehead. No acute intracranial abnormality. Specifically, no hemorrhage, hydrocephalus, mass lesion, acute infarction, or significant intracranial injury. No acute calvarial abnormality. Visualized paranasal sinuses and mastoids clear. Orbital soft tissues unremarkable.  CT CERVICAL SPINE FINDINGS  Mild degenerative disc disease diffusely throughout the cervical spine with disc space narrowing and spurring. Mild degenerative facet disease bilaterally. Normal alignment. Prevertebral soft tissues are normal.  Mild bilateral multi level neural foraminal narrowing due to uncovertebral spurring. No fracture. No epidural or paraspinal hematoma. Visualized lung apices clear.  IMPRESSION: No acute intracranial abnormality.  Mild degenerative disc and facet disease throughout the cervical spine with mild bilateral multi level neural foraminal narrowing. No acute bony abnormality.   Electronically Signed   By: Rolm Baptise M.D.   On: 01/27/2015 12:04   Ct Cervical Spine Wo Contrast  01/27/2015   CLINICAL DATA:  Fall last night, bilateral weakness.  EXAM: CT HEAD WITHOUT CONTRAST  CT CERVICAL SPINE WITHOUT CONTRAST  TECHNIQUE: Multidetector CT imaging of the head and cervical spine was performed following the standard protocol without intravenous contrast. Multiplanar CT image  reconstructions of the cervical spine were also generated.  COMPARISON:  None.  FINDINGS: CT HEAD FINDINGS  Soft tissue swelling over the right forehead. No acute intracranial abnormality. Specifically, no hemorrhage, hydrocephalus, mass lesion, acute infarction, or significant intracranial injury. No acute calvarial abnormality. Visualized paranasal sinuses and mastoids clear. Orbital soft tissues unremarkable.  CT CERVICAL SPINE FINDINGS   Mild degenerative disc disease diffusely throughout the cervical spine with disc space narrowing and spurring. Mild degenerative facet disease bilaterally. Normal alignment. Prevertebral soft tissues are normal.  Mild bilateral multi level neural foraminal narrowing due to uncovertebral spurring. No fracture. No epidural or paraspinal hematoma. Visualized lung apices clear.  IMPRESSION: No acute intracranial abnormality.  Mild degenerative disc and facet disease throughout the cervical spine with mild bilateral multi level neural foraminal narrowing. No acute bony abnormality.   Electronically Signed   By: Rolm Baptise M.D.   On: 01/27/2015 12:04   Mr Thoracic Spine Wo Contrast  01/27/2015   CLINICAL DATA:  Severe low back pain. Multiple falls. Weakness in both legs.  EXAM: MRI THORACIC SPINE WITHOUT CONTRAST  TECHNIQUE: Multiplanar, multisequence MR imaging of the thoracic spine was performed. No intravenous contrast was administered.  COMPARISON:  Radiographs dated 01/27/2015  FINDINGS: There is slight hypertrophy of the ligamentum flavum in the midline at T3-4 and T4-5 with no neural impingement.  Tiny central disc protrusion at T9-10 which touches the ventral aspect of the spinal cord but there is no myelopathy.  Hypertrophy of the ligamentum flavum to the right and left at T10-11. Old slight deformity of the superior endplate of Z61.  Small subacute or old compression fracture of the anterior superior aspect of T12. It is no disc protrusion.  T12-L1 and L1-2 are normal.  The thoracic spinal cord is normal. Paraspinal soft tissues are normal.  IMPRESSION: 1. Tiny subacute or old compression fracture of the anterior superior aspect of T12 with no neural impingement. 2. Old deformity of the superior endplate of W96 with no neural impingement. 3. Small central disc protrusion at T9-10 with no neural impingement.   Electronically Signed   By: Lorriane Shire M.D.   On: 01/27/2015 16:15   Mr Lumbar Spine Wo  Contrast  01/27/2015   CLINICAL DATA:  Multiple falls last night. Back pain with increased bilateral lower extremity weakness.  EXAM: MRI LUMBAR SPINE WITHOUT CONTRAST  TECHNIQUE: Multiplanar, multisequence MR imaging of the lumbar spine was performed. No intravenous contrast was administered.  COMPARISON:  Radiographs dated 01/27/2015  FINDINGS: There is an acute subtle compression fracture of the superior endplate of L4 asymmetric to the left. The fracture does not involve the posterior margin of the vertebral body. There is no bone or disc protrusion into the spinal canal at that level.  Normal conus tip at L1-2.  T12-L1:  Normal.  L1-2:  Tiny broad-based disc bulge with no neural impingement.  L2-3:  Tiny broad-based disc bulge with no neural impingement.  L3-4 disc:  Normal disc.  Slight bilateral facet arthritis.  L4-5: Slight broad-based disc bulge with slight narrowing of the left lateral recess which could affect the left L5 nerve.  L5-S1:  Small broad-based disc bulge with no neural impingement.  IMPRESSION: 1. Acute benign appearing compression fracture of the of the superior endplate of L4 asymmetric to the left. No neural impingement at that level. 2. Left lateral recess compression at L4-5 which could affect the left L5 nerve.   Electronically Signed   By: Lorriane Shire M.D.  On: 01/27/2015 16:35   Dg Foot Complete Left  01/27/2015   CLINICAL DATA:  Pain and swelling secondary to a fall.  EXAM: LEFT FOOT - COMPLETE 3+ VIEW  COMPARISON:  None.  FINDINGS: There fractures of the base of the second and fourth metatarsals. The fracture of the second metatarsal bases at the Lisfranc ligament attachment. There is abnormal widening of the joint space between the medial and middle cuneiforms. This is consistent with a Lisfranc injury. CT scan recommended for further evaluation since I suspect there other fractures there are not visible on these radiographs.  There small avulsion fractures from the tip of  the medial malleolus which I suspect are acute.  IMPRESSION: 1. Lisfranc fracture at the base of the second metatarsal. Fracture of the base of the fourth metatarsal. CT scan recommended for further evaluation of the midfoot. 2. Small probably acute avulsion fractures of the tip of the medial malleolus.   Electronically Signed   By: Lorriane Shire M.D.   On: 01/27/2015 13:39    EKG: No orders found for this or any previous visit.  IMPRESSION AND PLAN: Patient is a 54 year old white male who presents with multiple falls is noted to have rhabdomyolysis and a foot fracture  1. Rhabdomyolysis: We'll treat with aggressive IV fluids follow CPK in the morning 2. Falls and weakness: Possibly related to sedating medications, I will check his toxic urine drug screen check and an ESR level. If patient continues to have symptoms may need neurology input 3. Acute renal failure: Hold lisinopril will give him IV fluids 4. Hypertension we'll hold lisinopril, monitor blood pressure is borderline normal 5. Left foot fracture posterior splint and follow him up in the office with Dr. Mack Guise,  lno weightbearing  6. Miscellaneous: Lovenox for DVT prophylaxis     All the records are reviewed and case discussed with ED provider. Management plans discussed with the patient, family and they are in agreement.  CODE STATUS: Full    TOTAL TIME TAKING CARE OF THIS PATIENT: 55 minutes.    Dustin Flock M.D on 01/27/2015 at 5:41 PM  Between 7am to 6pm - Pager - [phone]  After 6pm go to [email or web address] - password [email or web address]  [facility]  Office  [phone]  CC: Primary care physician; Webb Silversmith, NP

## 2015-01-27 NOTE — ED Notes (Signed)
Brought in via ems from home s/p falls..states he fell last pm because his legs gave out .was unable to get up .Marland Kitchen

## 2015-01-27 NOTE — ED Provider Notes (Signed)
Follow-up note MRI returned showing some fractures however there is no cord impingement the patient does have a Lisfranc fracture of the foot and the CK is 7000 will plan on admitting him for these things  Nena Polio, MD 01/27/15 1656

## 2015-01-27 NOTE — ED Provider Notes (Signed)
Discussed with Dr. Herbie Drape says that put the patient in a posterior splint and they will follow him up in the office later recommend no weightbearing hospitalist will admit the patient is here with his rhabdomyolysis  Nena Polio, MD 01/27/15 1659

## 2015-01-28 ENCOUNTER — Inpatient Hospital Stay: Payer: Medicare Other

## 2015-01-28 LAB — ANA COMPREHENSIVE PANEL
Anti JO-1: <0.2 AI (ref 0.0–0.9)
Centromere Ab Screen: <0.2 AI (ref 0.0–0.9)
Chromatin Ab SerPl-aCnc: <0.2 AI (ref 0.0–0.9)
SSA (Ro) (ENA) Antibody, IgG: <0.2 AI (ref 0.0–0.9)
Scleroderma (Scl-70) (ENA) Antibody, IgG: <0.2 AI (ref 0.0–0.9)
ds DNA Ab: <1 IU/mL (ref 0–9)

## 2015-01-28 LAB — COMPREHENSIVE METABOLIC PANEL
ALK PHOS: 52 U/L (ref 38–126)
ALT: 36 U/L (ref 17–63)
ANION GAP: 10 (ref 5–15)
AST: 73 U/L — ABNORMAL HIGH (ref 15–41)
Albumin: 3.3 g/dL — ABNORMAL LOW (ref 3.5–5.0)
BILIRUBIN TOTAL: 1.3 mg/dL — AB (ref 0.3–1.2)
BUN: 32 mg/dL — ABNORMAL HIGH (ref 6–20)
CHLORIDE: 108 mmol/L (ref 101–111)
CO2: 21 mmol/L — ABNORMAL LOW (ref 22–32)
CREATININE: 1.52 mg/dL — AB (ref 0.61–1.24)
Calcium: 8.4 mg/dL — ABNORMAL LOW (ref 8.9–10.3)
GFR calc non Af Amer: 50 mL/min — ABNORMAL LOW (ref >60)
GFR, EST AFRICAN AMERICAN: 58 mL/min — AB (ref >60)
GLUCOSE: 89 mg/dL (ref 65–99)
Potassium: 5.2 mmol/L — ABNORMAL HIGH (ref 3.5–5.1)
SODIUM: 139 mmol/L (ref 135–145)
Total Protein: 6.1 g/dL — ABNORMAL LOW (ref 6.5–8.1)

## 2015-01-28 LAB — CK: Total CK: 3693 U/L — ABNORMAL HIGH (ref 49–397)

## 2015-01-28 MED ORDER — OXYCODONE HCL 5 MG PO TABS
5.0000 mg | ORAL_TABLET | ORAL | Status: DC | PRN
  Administered 2015-01-29 – 2015-01-31 (×4): 5 mg via ORAL
  Filled 2015-01-28 (×4): qty 1

## 2015-01-28 MED ORDER — MORPHINE SULFATE ER 30 MG PO TBCR
60.0000 mg | EXTENDED_RELEASE_TABLET | Freq: Three times a day (TID) | ORAL | Status: DC
  Administered 2015-01-28 – 2015-01-31 (×9): 60 mg via ORAL
  Filled 2015-01-28 (×9): qty 2

## 2015-01-28 NOTE — Progress Notes (Signed)
[facility] [place] NAME: Samuel Burke    MR#:  [id number]  DATE OF BIRTH:  Jun 22, 1961  SUBJECTIVE:  Came in from home after had an accidental fall. Pt does not recall the event  Other than he was in bed and when woke up found himself on the floor  REVIEW OF SYSTEMS:   Review of Systems  Constitutional: Negative for fever, chills and weight loss.  HENT: Negative for ear discharge, ear pain and nosebleeds.   Eyes: Negative for blurred vision, pain and discharge.  Respiratory: Negative for sputum production, shortness of breath, wheezing and stridor.   Cardiovascular: Negative for chest pain, palpitations, orthopnea and PND.  Gastrointestinal: Negative for nausea, vomiting, abdominal pain and diarrhea.  Genitourinary: Negative for urgency and frequency.  Musculoskeletal: Positive for myalgias, back pain, joint pain and falls.  Neurological: Positive for weakness. Negative for sensory change, speech change and focal weakness.  Psychiatric/Behavioral: Negative for depression. The patient is not nervous/anxious.   All other systems reviewed and are negative.  Tolerating Diet:yes   DRUG ALLERGIES:   Allergies  Allergen Reactions  . Bee Venom Swelling  . Poison Ivy Extract [Extract Of Poison Ivy] Swelling    VITALS:  Blood pressure 95/67, pulse 111, temperature 98.7 F (37.1 C), temperature source Oral, resp. rate 18, height 5\' 6"  (1.676 m), weight 78.971 kg (174 lb 1.6 oz), SpO2 98 %.  PHYSICAL EXAMINATION:   Physical Exam  GENERAL:  54 y.o.-year-old patient lying in the bed with no acute distress.  EYES: Pupils equal, round, reactive to light and accommodation. No scleral icterus. Extraocular muscles intact.  HEENT: Head atraumatic, normocephalic. Oropharynx and nasopharynx clear. Brusie+ over the forehaed NECK:  Supple, no jugular venous distention. No thyroid enlargement, no tenderness.  LUNGS: Normal breath sounds  bilaterally, no wheezing, rales, rhonchi. No use of accessory muscles of respiration.  CARDIOVASCULAR: S1, S2 normal. No murmurs, rubs, or gallops.  ABDOMEN: Soft, nontender, nondistended. Bowel sounds present. No organomegaly or mass.  EXTREMITIES: No cyanosis, clubbing or edema b/l.    NEUROLOGIC: Cranial nerves II through XII are intact. No focal Motor or sensory deficits b/l.   PSYCHIATRIC: The patient is alert and oriented x 3.  SKIN: bruises + in feet, laceration over the right elbow   LABORATORY PANEL:   CBC  Recent Labs Lab 01/27/15 1118  WBC 15.6*  HGB 13.4  HCT 40.1  PLT 196    Chemistries   Recent Labs Lab 01/28/15 0344  NA 139  K 5.2*  CL 108  CO2 21*  GLUCOSE 89  BUN 32*  CREATININE 1.52*  CALCIUM 8.4*  AST 73*  ALT 36  ALKPHOS 52  BILITOT 1.3*    Cardiac Enzymes No results for input(s): TROPONINI in the last 168 hours.  RADIOLOGY:  Dg Chest 1 View  01/27/2015   CLINICAL DATA:  fall, weakness in bilateral le  EXAM: CHEST  1 VIEW  COMPARISON:  None.  FINDINGS: Low volume chest. Bilateral basilar atelectasis. The cardiopericardial silhouette appears enlarged, out with side accentuated by low lung volumes. There is no pneumothorax. Thoracic vertebral body height appears preserved. Cholecystectomy clips are present in the right upper quadrant.  IMPRESSION: Low volume chest. Enlarged cardiopericardial silhouette, likely secondary to low volumes.   Electronically Signed   By: Dereck Ligas M.D.   On: 01/27/2015 13:35   Dg Thoracic Spine 2 View  01/27/2015   CLINICAL DATA:  Fall.  Lower extremity  weakness.  EXAM: THORACIC SPINE - 2-3 VIEWS  COMPARISON:  None.  FINDINGS: There is mild depression of the upper endplate of O17. No other evidence of a fracture. No spondylolisthesis. There are minor disc degenerative changes reflected by mild loss of disc height and small endplate osteophytes mostly along the mid thoracic spine.  Soft tissues are unremarkable.   IMPRESSION: 1. Mild depression of the upper endplates of P10 which may reflect a fracture. This could be recent or remote. 2. No other evidence of a fracture.   Electronically Signed   By: Lajean Manes M.D.   On: 01/27/2015 13:35   Dg Lumbar Spine Complete  01/27/2015   CLINICAL DATA:  Fall.  Lower extremity weakness.  EXAM: LUMBAR SPINE - COMPLETE 4+ VIEW  COMPARISON:  None.  FINDINGS: No fracture of the lumbar spine. No spondylolisthesis. Mild loss of disc height from the lower thoracic spine through L4-L5. There are small endplate osteophytes.  Soft tissues are unremarkable.  IMPRESSION: No fracture or acute finding.   Electronically Signed   By: Lajean Manes M.D.   On: 01/27/2015 13:36   Dg Pelvis 1-2 Views  01/27/2015   CLINICAL DATA:  Follow-up. Bilateral lower extremity weakness. Initial encounter.  EXAM: PELVIS - 1-2 VIEW  COMPARISON:  None.  FINDINGS: Pelvic rings appear intact. Proximal femur appears normal bilaterally. Sacral arcades normal. Phleboliths in the anatomic pelvis. Hip joint spaces are symmetric.  IMPRESSION: No acute osseous abnormality.   Electronically Signed   By: Dereck Ligas M.D.   On: 01/27/2015 13:37   Ct Head Wo Contrast  01/27/2015   CLINICAL DATA:  Fall last night, bilateral weakness.  EXAM: CT HEAD WITHOUT CONTRAST  CT CERVICAL SPINE WITHOUT CONTRAST  TECHNIQUE: Multidetector CT imaging of the head and cervical spine was performed following the standard protocol without intravenous contrast. Multiplanar CT image reconstructions of the cervical spine were also generated.  COMPARISON:  None.  FINDINGS: CT HEAD FINDINGS  Soft tissue swelling over the right forehead. No acute intracranial abnormality. Specifically, no hemorrhage, hydrocephalus, mass lesion, acute infarction, or significant intracranial injury. No acute calvarial abnormality. Visualized paranasal sinuses and mastoids clear. Orbital soft tissues unremarkable.  CT CERVICAL SPINE FINDINGS  Mild degenerative disc  disease diffusely throughout the cervical spine with disc space narrowing and spurring. Mild degenerative facet disease bilaterally. Normal alignment. Prevertebral soft tissues are normal.  Mild bilateral multi level neural foraminal narrowing due to uncovertebral spurring. No fracture. No epidural or paraspinal hematoma. Visualized lung apices clear.  IMPRESSION: No acute intracranial abnormality.  Mild degenerative disc and facet disease throughout the cervical spine with mild bilateral multi level neural foraminal narrowing. No acute bony abnormality.   Electronically Signed   By: Rolm Baptise M.D.   On: 01/27/2015 12:04   Ct Cervical Spine Wo Contrast  01/27/2015   CLINICAL DATA:  Fall last night, bilateral weakness.  EXAM: CT HEAD WITHOUT CONTRAST  CT CERVICAL SPINE WITHOUT CONTRAST  TECHNIQUE: Multidetector CT imaging of the head and cervical spine was performed following the standard protocol without intravenous contrast. Multiplanar CT image reconstructions of the cervical spine were also generated.  COMPARISON:  None.  FINDINGS: CT HEAD FINDINGS  Soft tissue swelling over the right forehead. No acute intracranial abnormality. Specifically, no hemorrhage, hydrocephalus, mass lesion, acute infarction, or significant intracranial injury. No acute calvarial abnormality. Visualized paranasal sinuses and mastoids clear. Orbital soft tissues unremarkable.  CT CERVICAL SPINE FINDINGS  Mild degenerative disc disease diffusely throughout the cervical  spine with disc space narrowing and spurring. Mild degenerative facet disease bilaterally. Normal alignment. Prevertebral soft tissues are normal.  Mild bilateral multi level neural foraminal narrowing due to uncovertebral spurring. No fracture. No epidural or paraspinal hematoma. Visualized lung apices clear.  IMPRESSION: No acute intracranial abnormality.  Mild degenerative disc and facet disease throughout the cervical spine with mild bilateral multi level neural  foraminal narrowing. No acute bony abnormality.   Electronically Signed   By: Rolm Baptise M.D.   On: 01/27/2015 12:04   Mr Thoracic Spine Wo Contrast  01/27/2015   CLINICAL DATA:  Severe low back pain. Multiple falls. Weakness in both legs.  EXAM: MRI THORACIC SPINE WITHOUT CONTRAST  TECHNIQUE: Multiplanar, multisequence MR imaging of the thoracic spine was performed. No intravenous contrast was administered.  COMPARISON:  Radiographs dated 01/27/2015  FINDINGS: There is slight hypertrophy of the ligamentum flavum in the midline at T3-4 and T4-5 with no neural impingement.  Tiny central disc protrusion at T9-10 which touches the ventral aspect of the spinal cord but there is no myelopathy.  Hypertrophy of the ligamentum flavum to the right and left at T10-11. Old slight deformity of the superior endplate of L93.  Small subacute or old compression fracture of the anterior superior aspect of T12. It is no disc protrusion.  T12-L1 and L1-2 are normal.  The thoracic spinal cord is normal. Paraspinal soft tissues are normal.  IMPRESSION: 1. Tiny subacute or old compression fracture of the anterior superior aspect of T12 with no neural impingement. 2. Old deformity of the superior endplate of X90 with no neural impingement. 3. Small central disc protrusion at T9-10 with no neural impingement.   Electronically Signed   By: Lorriane Shire M.D.   On: 01/27/2015 16:15   Mr Lumbar Spine Wo Contrast  01/27/2015   CLINICAL DATA:  Multiple falls last night. Back pain with increased bilateral lower extremity weakness.  EXAM: MRI LUMBAR SPINE WITHOUT CONTRAST  TECHNIQUE: Multiplanar, multisequence MR imaging of the lumbar spine was performed. No intravenous contrast was administered.  COMPARISON:  Radiographs dated 01/27/2015  FINDINGS: There is an acute subtle compression fracture of the superior endplate of L4 asymmetric to the left. The fracture does not involve the posterior margin of the vertebral body. There is no bone  or disc protrusion into the spinal canal at that level.  Normal conus tip at L1-2.  T12-L1:  Normal.  L1-2:  Tiny broad-based disc bulge with no neural impingement.  L2-3:  Tiny broad-based disc bulge with no neural impingement.  L3-4 disc:  Normal disc.  Slight bilateral facet arthritis.  L4-5: Slight broad-based disc bulge with slight narrowing of the left lateral recess which could affect the left L5 nerve.  L5-S1:  Small broad-based disc bulge with no neural impingement.  IMPRESSION: 1. Acute benign appearing compression fracture of the of the superior endplate of L4 asymmetric to the left. No neural impingement at that level. 2. Left lateral recess compression at L4-5 which could affect the left L5 nerve.   Electronically Signed   By: Lorriane Shire M.D.   On: 01/27/2015 16:35   Dg Foot Complete Left  01/27/2015   CLINICAL DATA:  Pain and swelling secondary to a fall.  EXAM: LEFT FOOT - COMPLETE 3+ VIEW  COMPARISON:  None.  FINDINGS: There fractures of the base of the second and fourth metatarsals. The fracture of the second metatarsal bases at the Lisfranc ligament attachment. There is abnormal widening of the joint  space between the medial and middle cuneiforms. This is consistent with a Lisfranc injury. CT scan recommended for further evaluation since I suspect there other fractures there are not visible on these radiographs.  There small avulsion fractures from the tip of the medial malleolus which I suspect are acute.  IMPRESSION: 1. Lisfranc fracture at the base of the second metatarsal. Fracture of the base of the fourth metatarsal. CT scan recommended for further evaluation of the midfoot. 2. Small probably acute avulsion fractures of the tip of the medial malleolus.   Electronically Signed   By: Lorriane Shire M.D.   On: 01/27/2015 13:39   Dg Foot Complete Right  01/27/2015   CLINICAL DATA:  Fall today. RIGHT foot pain. Initial encounter. Pain with movement.  EXAM: RIGHT FOOT COMPLETE - 3+ VIEW   COMPARISON:  None.  FINDINGS: Transverse fractures through the metatarsal necks of the second, third, and fourth metatarsals are present. The second and third metatarsal neck fractures are mildly displaced, with mild medial angulation. The fourth metatarsal neck fracture is comminuted and moderately displaced. Comminuted fractures extend into the medial margin of the RIGHT fourth metatarsal head but there is no articular surface step-off deformity. Maximal displacement of fourth metatarsal head and neck fragments is 3 mm.  IMPRESSION: Transverse second, third, and comminuted fourth metatarsal neck fractures. Fourth metatarsal neck fracture is comminute.   Electronically Signed   By: Dereck Ligas M.D.   On: 01/27/2015 18:39     ASSESSMENT AND PLAN:    54 year old white male with h/o spinal stenosis on chronci high dose po morphinewho presents with multiple falls is noted to have rhabdomyolysis and a foot fracture   1.Acute habdomyolysis: We'll treat with aggressive IV fluids. -CPK trending down 7K-->3K  2. Falls and weakness: Possibly related to sedating medications,  -pt denies overusing pain meds  3. Acute renal failure: Hold lisinopril wil2-3l give him IV fluids   4. Hypertension we'll hold lisinopril, monitor blood pressure is borderline normal   5. Bilateral multiple metatrasal  fracture s and acute VC Fracture -bed rest -Dr Mardi Mainland to see pt   6. Miscellaneous: Lovenox for DVT prophylaxis  Case discussed with Care Management/Social Worker. Management plans discussed with the patient, family and they are in agreement.  CODE STATUS:full  DVT Prophylaxis:lovenox  TOTAL TIME TAKING CARE OF THIS PATIENT:35 minutes.  >50% time spent on counselling and coordination of care  POSSIBLE D/C IN 2-3 DAYS, DEPENDING ON CLINICAL CONDITION.   Eyva Califano M.D on 01/28/2015 at 11:47 AM  Between 7am to 6pm - Pager - [phone]  After 6pm go to [email or web address] - password [email or web address] [facility]  [place]  [facility]  Office  [phone]  CC: Primary care physician; Webb Silversmith, NP

## 2015-01-28 NOTE — Progress Notes (Signed)
Patient states he takes MS Contin 60mg  every 8 hours, according to PTA meds, order is every 12 hours, but patient takes differently. Pharmacy at [facility] is currently closed. Dr Posey Pronto called, patient wants his MS contin ealry, MD states it's ok to give 10 am dose early.

## 2015-01-28 NOTE — Consult Note (Signed)
ORTHOPAEDIC CONSULTATION  REQUESTING PHYSICIAN: Fritzi Mandes, MD  Chief Complaint: Bilateral foot pain  HPI: Samuel Burke is a 54 y.o. male who complains of  bilateral foot pain after a fall out of bed at home on Thursday night. He does not recall the details of the fall. He states he fell asleep with his laptop on his lap. He believes the computer fell out of bed and then he fell out of bed reaching to get the computer. He believes the fall happened around 4 AM but does not recall any specific details until about 6 AM. He has injuries to his face and arms and legs. He has soreness in his left shoulder, his low back and bilateral feet. His feet are most painful for him.  Past Medical History  Diagnosis Date  . History of colon polyps   . Frequent headaches   . Hypertension   . Spinal stenosis    Past Surgical History  Procedure Laterality Date  . Cholecystectomy  2015   History   Social History  . Marital Status: Widowed    Spouse Name: N/A  . Number of Children: N/A  . Years of Education: N/A   Social History Main Topics  . Smoking status: Former Smoker    Quit date: 08/15/2011  . Smokeless tobacco: Never Used  . Alcohol Use: No     Comment: sober--since 2002  . Drug Use: No  . Sexual Activity: Not Currently   Other Topics Concern  . None   Social History Narrative   Family History  Problem Relation Age of Onset  . Cancer Mother     breast  . Alcohol abuse Father   . Heart disease Father   . Heart disease Brother   . Hypertension Brother    Allergies  Allergen Reactions  . Bee Venom Swelling  . Poison Ivy Extract [Extract Of Poison Ivy] Swelling   Prior to Admission medications   Medication Sig Start Date End Date Taking? Authorizing Provider  baclofen (LIORESAL) 10 MG tablet Take 10 mg by mouth 2 (two) times daily.   Yes Historical Provider, MD  Cholecalciferol (RA VITAMIN D-3) 2000 UNITS CAPS Take 1 capsule by mouth daily. Pt takes with a 5,000 unit  capsule.   Yes Historical Provider, MD  Cholecalciferol (VITAMIN D3) 5000 UNITS CAPS Take 1 capsule by mouth daily. Pt takes with a 2,000 unit capsule.   Yes Historical Provider, MD  diclofenac (VOLTAREN) 50 MG EC tablet Take 50 mg by mouth 2 (two) times daily.   Yes Historical Provider, MD  gabapentin (NEURONTIN) 600 MG tablet Take 600 mg by mouth 3 (three) times daily.   Yes Historical Provider, MD  lisinopril (PRINIVIL,ZESTRIL) 20 MG tablet Take 20 mg by mouth at bedtime.   Yes Historical Provider, MD  morphine (MS CONTIN) 60 MG 12 hr tablet Take 1 tablet (60 mg total) by mouth every 12 (twelve) hours. Patient taking differently: Take 60 mg by mouth every 8 (eight) hours.  10/20/14  Yes Jearld Fenton, NP  LORazepam (ATIVAN) 0.5 MG tablet Take 0.5 mg by mouth at bedtime as needed for sleep.     Historical Provider, MD  morphine (MS CONTIN) 15 MG 12 hr tablet Take 1 tablet (15 mg total) by mouth every 12 (twelve) hours. Patient not taking: Reported on 01/27/2015 10/20/14   Jearld Fenton, NP  Oxycodone HCl 10 MG TABS Take 1 tablet (10 mg total) by mouth every 6 (six) hours. Patient not taking: Reported  on 01/27/2015 10/20/14   Jearld Fenton, NP   Dg Chest 1 View  01/27/2015   CLINICAL DATA:  fall, weakness in bilateral le  EXAM: CHEST  1 VIEW  COMPARISON:  None.  FINDINGS: Low volume chest. Bilateral basilar atelectasis. The cardiopericardial silhouette appears enlarged, out with side accentuated by low lung volumes. There is no pneumothorax. Thoracic vertebral body height appears preserved. Cholecystectomy clips are present in the right upper quadrant.  IMPRESSION: Low volume chest. Enlarged cardiopericardial silhouette, likely secondary to low volumes.   Electronically Signed   By: Dereck Ligas M.D.   On: 01/27/2015 13:35   Dg Thoracic Spine 2 View  01/27/2015   CLINICAL DATA:  Fall.  Lower extremity weakness.  EXAM: THORACIC SPINE - 2-3 VIEWS  COMPARISON:  None.  FINDINGS: There is mild depression  of the upper endplate of W09. No other evidence of a fracture. No spondylolisthesis. There are minor disc degenerative changes reflected by mild loss of disc height and small endplate osteophytes mostly along the mid thoracic spine.  Soft tissues are unremarkable.  IMPRESSION: 1. Mild depression of the upper endplates of W11 which may reflect a fracture. This could be recent or remote. 2. No other evidence of a fracture.   Electronically Signed   By: Lajean Manes M.D.   On: 01/27/2015 13:35   Dg Lumbar Spine Complete  01/27/2015   CLINICAL DATA:  Fall.  Lower extremity weakness.  EXAM: LUMBAR SPINE - COMPLETE 4+ VIEW  COMPARISON:  None.  FINDINGS: No fracture of the lumbar spine. No spondylolisthesis. Mild loss of disc height from the lower thoracic spine through L4-L5. There are small endplate osteophytes.  Soft tissues are unremarkable.  IMPRESSION: No fracture or acute finding.   Electronically Signed   By: Lajean Manes M.D.   On: 01/27/2015 13:36   Dg Pelvis 1-2 Views  01/27/2015   CLINICAL DATA:  Follow-up. Bilateral lower extremity weakness. Initial encounter.  EXAM: PELVIS - 1-2 VIEW  COMPARISON:  None.  FINDINGS: Pelvic rings appear intact. Proximal femur appears normal bilaterally. Sacral arcades normal. Phleboliths in the anatomic pelvis. Hip joint spaces are symmetric.  IMPRESSION: No acute osseous abnormality.   Electronically Signed   By: Dereck Ligas M.D.   On: 01/27/2015 13:37   Ct Head Wo Contrast  01/27/2015   CLINICAL DATA:  Fall last night, bilateral weakness.  EXAM: CT HEAD WITHOUT CONTRAST  CT CERVICAL SPINE WITHOUT CONTRAST  TECHNIQUE: Multidetector CT imaging of the head and cervical spine was performed following the standard protocol without intravenous contrast. Multiplanar CT image reconstructions of the cervical spine were also generated.  COMPARISON:  None.  FINDINGS: CT HEAD FINDINGS  Soft tissue swelling over the right forehead. No acute intracranial abnormality.  Specifically, no hemorrhage, hydrocephalus, mass lesion, acute infarction, or significant intracranial injury. No acute calvarial abnormality. Visualized paranasal sinuses and mastoids clear. Orbital soft tissues unremarkable.  CT CERVICAL SPINE FINDINGS  Mild degenerative disc disease diffusely throughout the cervical spine with disc space narrowing and spurring. Mild degenerative facet disease bilaterally. Normal alignment. Prevertebral soft tissues are normal.  Mild bilateral multi level neural foraminal narrowing due to uncovertebral spurring. No fracture. No epidural or paraspinal hematoma. Visualized lung apices clear.  IMPRESSION: No acute intracranial abnormality.  Mild degenerative disc and facet disease throughout the cervical spine with mild bilateral multi level neural foraminal narrowing. No acute bony abnormality.   Electronically Signed   By: Rolm Baptise M.D.   On: 01/27/2015  12:04   Ct Cervical Spine Wo Contrast  01/27/2015   CLINICAL DATA:  Fall last night, bilateral weakness.  EXAM: CT HEAD WITHOUT CONTRAST  CT CERVICAL SPINE WITHOUT CONTRAST  TECHNIQUE: Multidetector CT imaging of the head and cervical spine was performed following the standard protocol without intravenous contrast. Multiplanar CT image reconstructions of the cervical spine were also generated.  COMPARISON:  None.  FINDINGS: CT HEAD FINDINGS  Soft tissue swelling over the right forehead. No acute intracranial abnormality. Specifically, no hemorrhage, hydrocephalus, mass lesion, acute infarction, or significant intracranial injury. No acute calvarial abnormality. Visualized paranasal sinuses and mastoids clear. Orbital soft tissues unremarkable.  CT CERVICAL SPINE FINDINGS  Mild degenerative disc disease diffusely throughout the cervical spine with disc space narrowing and spurring. Mild degenerative facet disease bilaterally. Normal alignment. Prevertebral soft tissues are normal.  Mild bilateral multi level neural foraminal  narrowing due to uncovertebral spurring. No fracture. No epidural or paraspinal hematoma. Visualized lung apices clear.  IMPRESSION: No acute intracranial abnormality.  Mild degenerative disc and facet disease throughout the cervical spine with mild bilateral multi level neural foraminal narrowing. No acute bony abnormality.   Electronically Signed   By: Rolm Baptise M.D.   On: 01/27/2015 12:04   Mr Thoracic Spine Wo Contrast  01/27/2015   CLINICAL DATA:  Severe low back pain. Multiple falls. Weakness in both legs.  EXAM: MRI THORACIC SPINE WITHOUT CONTRAST  TECHNIQUE: Multiplanar, multisequence MR imaging of the thoracic spine was performed. No intravenous contrast was administered.  COMPARISON:  Radiographs dated 01/27/2015  FINDINGS: There is slight hypertrophy of the ligamentum flavum in the midline at T3-4 and T4-5 with no neural impingement.  Tiny central disc protrusion at T9-10 which touches the ventral aspect of the spinal cord but there is no myelopathy.  Hypertrophy of the ligamentum flavum to the right and left at T10-11. Old slight deformity of the superior endplate of Q00.  Small subacute or old compression fracture of the anterior superior aspect of T12. It is no disc protrusion.  T12-L1 and L1-2 are normal.  The thoracic spinal cord is normal. Paraspinal soft tissues are normal.  IMPRESSION: 1. Tiny subacute or old compression fracture of the anterior superior aspect of T12 with no neural impingement. 2. Old deformity of the superior endplate of Q67 with no neural impingement. 3. Small central disc protrusion at T9-10 with no neural impingement.   Electronically Signed   By: Lorriane Shire M.D.   On: 01/27/2015 16:15   Mr Lumbar Spine Wo Contrast  01/27/2015   CLINICAL DATA:  Multiple falls last night. Back pain with increased bilateral lower extremity weakness.  EXAM: MRI LUMBAR SPINE WITHOUT CONTRAST  TECHNIQUE: Multiplanar, multisequence MR imaging of the lumbar spine was performed. No  intravenous contrast was administered.  COMPARISON:  Radiographs dated 01/27/2015  FINDINGS: There is an acute subtle compression fracture of the superior endplate of L4 asymmetric to the left. The fracture does not involve the posterior margin of the vertebral body. There is no bone or disc protrusion into the spinal canal at that level.  Normal conus tip at L1-2.  T12-L1:  Normal.  L1-2:  Tiny broad-based disc bulge with no neural impingement.  L2-3:  Tiny broad-based disc bulge with no neural impingement.  L3-4 disc:  Normal disc.  Slight bilateral facet arthritis.  L4-5: Slight broad-based disc bulge with slight narrowing of the left lateral recess which could affect the left L5 nerve.  L5-S1:  Small broad-based disc bulge  with no neural impingement.  IMPRESSION: 1. Acute benign appearing compression fracture of the of the superior endplate of L4 asymmetric to the left. No neural impingement at that level. 2. Left lateral recess compression at L4-5 which could affect the left L5 nerve.   Electronically Signed   By: Lorriane Shire M.D.   On: 01/27/2015 16:35   Dg Foot Complete Left  01/27/2015   CLINICAL DATA:  Pain and swelling secondary to a fall.  EXAM: LEFT FOOT - COMPLETE 3+ VIEW  COMPARISON:  None.  FINDINGS: There fractures of the base of the second and fourth metatarsals. The fracture of the second metatarsal bases at the Lisfranc ligament attachment. There is abnormal widening of the joint space between the medial and middle cuneiforms. This is consistent with a Lisfranc injury. CT scan recommended for further evaluation since I suspect there other fractures there are not visible on these radiographs.  There small avulsion fractures from the tip of the medial malleolus which I suspect are acute.  IMPRESSION: 1. Lisfranc fracture at the base of the second metatarsal. Fracture of the base of the fourth metatarsal. CT scan recommended for further evaluation of the midfoot. 2. Small probably acute avulsion  fractures of the tip of the medial malleolus.   Electronically Signed   By: Lorriane Shire M.D.   On: 01/27/2015 13:39   Dg Foot Complete Right  01/27/2015   CLINICAL DATA:  Fall today. RIGHT foot pain. Initial encounter. Pain with movement.  EXAM: RIGHT FOOT COMPLETE - 3+ VIEW  COMPARISON:  None.  FINDINGS: Transverse fractures through the metatarsal necks of the second, third, and fourth metatarsals are present. The second and third metatarsal neck fractures are mildly displaced, with mild medial angulation. The fourth metatarsal neck fracture is comminuted and moderately displaced. Comminuted fractures extend into the medial margin of the RIGHT fourth metatarsal head but there is no articular surface step-off deformity. Maximal displacement of fourth metatarsal head and neck fragments is 3 mm.  IMPRESSION: Transverse second, third, and comminuted fourth metatarsal neck fractures. Fourth metatarsal neck fracture is comminute.   Electronically Signed   By: Dereck Ligas M.D.   On: 01/27/2015 18:39    Positive ROS: All other systems have been reviewed and were otherwise negative with the exception of those mentioned in the HPI and as above.  Physical Exam: General: Alert, no acute distress.  Patient is sitting up in bed and response appropriately to questions and follows commands.  MUSCULOSKELETAL: Left foot: Patient has intact sensation to light touch in all 5 toes. He can flex and extend his toes. His foot compartments are soft and compressible. His toes are well-perfused. He is in a posterior splint. He has no pain with passive stretch. Right foot: Patient has diffuse swelling but his foot compartments are soft and compressible. He has palpable pedal pulses and intact sensation to light touch. He can flex and extend his toes. He has no pain with passive stretch.  Assessment: #1 left foot Lisfranc fracture #2 right metatarsal fractures  Plan: Patient has sustained bilateral foot injuries. On the  left side he has a Lisfranc fracture. There is only slight diastases between the first and second metatarsal bases. He also has a nondisplaced fracture of the left fourth metatarsal. On the right side the patient has sustained comminuted fractures of the second third and fourth metatarsal necks. There is slight angulation and minimal displacement of these fractures. I have discussed this patient with Dr. Albertine Patricia the  podiatrist on-call was agreed to see the patient tomorrow. I have ordered a CAT scan of the left foot to further evaluate the Lisfranc injury. Patient will remain on bed rest and nonweightbearing on either lower extremity until further evaluation. He will continue to elevate his lower extremities.  Patient had an MRI revealing an acute compression fracture of L4. This may be treated symptomatically. The patient may benefit from a lumbosacral corset. I have ordered a postop shoe for protection the right foot. I will also order a lumbosacral corset. I will defer to Dr. Elvina Mattes for further management of these foot injuries.    Thornton Park, MD    01/28/2015 4:25 PM

## 2015-01-28 NOTE — Evaluation (Signed)
Clinical/Bedside Swallow Evaluation Patient Details  Name: Samuel Burke MRN: [id number] Date of Birth: Apr 27, 1961  Today's Date: 01/28/2015 Time: SLP Start Time (ACUTE ONLY): 1056 SLP Stop Time (ACUTE ONLY): 1135 SLP Time Calculation (min) (ACUTE ONLY): 39 min  Past Medical History:  Past Medical History  Diagnosis Date  . History of colon polyps   . Frequent headaches   . Hypertension   . Spinal stenosis    Past Surgical History:  Past Surgical History  Procedure Laterality Date  . Cholecystectomy  2015   HPI:      Assessment / Plan / Recommendation Clinical Impression  pt presents with no dysphagia noted. pt was reported and self reports, having aspirated yesterday, however pt states this was during and immediatly following regurgitation.  ST education provided on risks of aspiration and pt safety concerns. pt with no overt ssx aspiration noted with regular with thin diet. st does not recommend further interventions at this time however educated pt to contact st if changes occur.     Aspiration Risk  None    Diet Recommendation Age appropriate regular solids;Thin   Medication Administration: Whole meds with liquid    Other  Recommendations Oral Care Recommendations: Patient independent with oral care   Follow Up Recommendations    Follow up with slp if changes in status occur.   Frequency and Duration   Evaluation only     Pertinent Vitals/Pain None reported    SLP Swallow Goals     Swallow Study Prior Functional Status       General Date of Onset: 01/28/15 Type of Study: Bedside swallow evaluation Diet Prior to this Study: Regular;Thin liquids Temperature Spikes Noted: N/A Respiratory Status: Room air History of Recent Intubation: No Behavior/Cognition: Alert;Cooperative;Pleasant mood Oral Cavity - Dentition: Adequate natural dentition/normal for age Self-Feeding Abilities: Able to feed self Baseline Vocal Quality: Normal Volitional Cough:  Strong Volitional Swallow: Able to elicit    Oral/Motor/Sensory Function Overall Oral Motor/Sensory Function: Appears within functional limits for tasks assessed Labial Strength: Within Functional Limits Lingual Strength: Within Functional Limits   Ice Chips Ice chips: Not tested   Thin Liquid Thin Liquid: Within functional limits Presentation: Cup;Straw    Nectar Thick Nectar Thick Liquid: Not tested   Honey Thick Honey Thick Liquid: Not tested   Puree Puree: Within functional limits Presentation: Self Fed   Solid   GO    Solid: Within functional limits Presentation: [place] 01/28/2015,12:27 PM

## 2015-01-28 NOTE — Progress Notes (Addendum)
PT Cancellation Note  Patient Details Name: Ameen Mostafa MRN: [id number] DOB: 03/12/1961   Cancelled Treatment:    Reason Eval/Treat Not Completed: Other (comment) (See note for details.) Spoke with pt's RN and she reported pt had imagining of R foot due to pain complaints and imaging showed R foot fractures. RN reported they are awaiting ortho MD to assess imagining and determine R foot weight bearing status. Pt currently L foot NWB. PT will reattempt eval once R foot WB status is determined.   1012: per pt's MD, hold pt until tomorrow (01/29/15).  Gurbani Figge L 01/28/2015, 10:04 AM Geoffry Paradise, PT,DPT 01/28/2015 10:06 AM Phone: [phone] Fax: [phone]

## 2015-01-28 NOTE — Progress Notes (Signed)
Initial Nutrition Assessment    INTERVENTION:    Meals and Snacks: Cater to patient preferences Medical Food Supplement Therapy: if po intake inadequate on follow-up, recommend addition of nutritional supplement  NUTRITION DIAGNOSIS:   No nutrition diagnosis at this time  GOAL:  Patient will meet greater than or equal to 90% of their needs  MONITOR:   (Energy Intake, Anthropometrics, Digestive System, Electrolyte/Renal Profile)  REASON FOR ASSESSMENT:  Malnutrition Screening Tool    ASSESSMENT:  Pt admitted s/p fall, weakness, rhabdomyolsis; Bilateral multiple metatrasal fractures and acute VC Fracture  PMHx:  Past Medical History  Diagnosis Date  . History of colon polyps   . Frequent headaches   . Hypertension   . Spinal stenosis     Diet Order: 2g sodium, noted SLP consult, working with SLP on visit this AM  Current Nutrition: no documented po intake, tolerated breakfast this AM per report  Food/Nutrition-Related History: unable to assess   Medications: NS at 125 ml/hr, Vitamin D  Electrolyte/Renal Profile and Glucose Profile:   Recent Labs Lab 01/27/15 1118 01/28/15 0344  NA 137 139  K 5.5* 5.2*  CL 103 108  CO2 25 21*  BUN 37* 32*  CREATININE 1.97* 1.52*  CALCIUM 9.6 8.4*  GLUCOSE 111* 89   Protein Profile:   Recent Labs Lab 01/28/15 0344  ALBUMIN 3.3*    Gastrointestinal Profile: 200 mL emesis documented Last BM: 6/30   Nutrition-Focused Physical Exam Findings:  Unable to complete Nutrition-Focused physical exam at this time.     Weight Change: weight appears stable based on documented weights from previous admissions  Anthropometrics: Height:  Ht Readings from Last 1 Encounters:  01/27/15 5\' 6"  (1.676 m)    Weight:  Wt Readings from Last 1 Encounters:  01/27/15 174 lb 1.6 oz (78.971 kg)    Filed Weights   01/27/15 1107 01/27/15 2015  Weight: 174 lb (78.926 kg) 174 lb 1.6 oz (78.971 kg)     Wt Readings from  Last 10 Encounters:  01/27/15 174 lb 1.6 oz (78.971 kg)  01/27/15 174 lb (78.926 kg)  12/06/14 179 lb (81.194 kg)  10/20/14 176 lb (79.833 kg)    BMI:  Body mass index is 28.11 kg/(m^2).  LOW Care Level  Kerman Passey MS, [place], LDN [phone] Pager

## 2015-01-28 NOTE — Progress Notes (Signed)
Patient arrived on unit from ED, family at bedside. Alert and oriented, verbal reorientation giving. Skin dry warm and pale. LLE dressing in place. Edema noted to RLE. Abrasions generalized per patient from fall. Lungs are clear except for crackles in left lobe. Swallow evaluation performed patient unable to tolerated sips or water. No visible signs of acute distress noted or chocking. Continuous pulse ox intact showing saturation of 95%. Per patient has periods of sleep apnea, for there is no history. Patient complains of N/V controlled with Zofran see MAR.

## 2015-01-29 LAB — CK: Total CK: 1466 U/L — ABNORMAL HIGH (ref 49–397)

## 2015-01-29 NOTE — Plan of Care (Signed)
Problem: Discharge Progression Outcomes Goal: Other Discharge Outcomes/Goals Outcome: Progressing Pt continue on IVF, good urine output. Pain control, requested med x 1 for breakthrough pain in feet. Remains NWB with bilateral fx toes, and has acute compression fracture in his back. Able to call for assistance as needed. Will need assistance at discharge when pt is back home.

## 2015-01-29 NOTE — Progress Notes (Signed)
Subjective:  Patient reports pain as moderate.  Patient remains on bed rest. I reviewed the left foot CT. Fractures noted of the second third and fourth metatarsal bases. No significant widening between the first and second metatarsal bases suggestive of no significant Lisfranc injury. Patient remains in posterior splint on the left side. Cast shoe not yet delivered to the patient. Ordered yesterday. Awaiting consult by Dr. Elvina Mattes.  Objective:   VITALS:   Filed Vitals:   01/28/15 1512 01/28/15 2010 01/29/15 0420 01/29/15 0730  BP: 94/53 107/67 99/54 96/58   Pulse: 104 102 90 95  Temp: 98.4 F (36.9 C) 98.3 F (36.8 C) 98.3 F (36.8 C) 98.9 F (37.2 C)  TempSrc: Oral Oral Oral Oral  Resp: 18 20 18 18   Height:      Weight:      SpO2: 94% 96% 98% 98%   Bilateral lower extremity is: Neurologically intact Neurovascular intact Sensation intact distally Intact pulses distally Dorsiflexion/Plantar flexion intact Compartment soft  Patient can flex and extend toes on both feet. Toes are well-perfused. Sensation intact in all toes. Feet are swollen but compartments are soft.  LABS  Results for orders placed or performed during the hospital encounter of 01/27/15 (from the past 24 hour(s))  CK     Status: Abnormal   Collection Time: 01/29/15  3:35 AM  Result Value Ref Range   Total CK 1466 (H) 49 - 397 U/L    Dg Chest 1 View  01/27/2015   CLINICAL DATA:  fall, weakness in bilateral le  EXAM: CHEST  1 VIEW  COMPARISON:  None.  FINDINGS: Low volume chest. Bilateral basilar atelectasis. The cardiopericardial silhouette appears enlarged, out with side accentuated by low lung volumes. There is no pneumothorax. Thoracic vertebral body height appears preserved. Cholecystectomy clips are present in the right upper quadrant.  IMPRESSION: Low volume chest. Enlarged cardiopericardial silhouette, likely secondary to low volumes.   Electronically Signed   By: Dereck Ligas M.D.   On: 01/27/2015  13:35   Dg Thoracic Spine 2 View  01/27/2015   CLINICAL DATA:  Fall.  Lower extremity weakness.  EXAM: THORACIC SPINE - 2-3 VIEWS  COMPARISON:  None.  FINDINGS: There is mild depression of the upper endplate of I62. No other evidence of a fracture. No spondylolisthesis. There are minor disc degenerative changes reflected by mild loss of disc height and small endplate osteophytes mostly along the mid thoracic spine.  Soft tissues are unremarkable.  IMPRESSION: 1. Mild depression of the upper endplates of V03 which may reflect a fracture. This could be recent or remote. 2. No other evidence of a fracture.   Electronically Signed   By: Lajean Manes M.D.   On: 01/27/2015 13:35   Dg Lumbar Spine Complete  01/27/2015   CLINICAL DATA:  Fall.  Lower extremity weakness.  EXAM: LUMBAR SPINE - COMPLETE 4+ VIEW  COMPARISON:  None.  FINDINGS: No fracture of the lumbar spine. No spondylolisthesis. Mild loss of disc height from the lower thoracic spine through L4-L5. There are small endplate osteophytes.  Soft tissues are unremarkable.  IMPRESSION: No fracture or acute finding.   Electronically Signed   By: Lajean Manes M.D.   On: 01/27/2015 13:36   Dg Pelvis 1-2 Views  01/27/2015   CLINICAL DATA:  Follow-up. Bilateral lower extremity weakness. Initial encounter.  EXAM: PELVIS - 1-2 VIEW  COMPARISON:  None.  FINDINGS: Pelvic rings appear intact. Proximal femur appears normal bilaterally. Sacral arcades normal. Phleboliths in the anatomic  pelvis. Hip joint spaces are symmetric.  IMPRESSION: No acute osseous abnormality.   Electronically Signed   By: Dereck Ligas M.D.   On: 01/27/2015 13:37   Ct Head Wo Contrast  01/27/2015   CLINICAL DATA:  Fall last night, bilateral weakness.  EXAM: CT HEAD WITHOUT CONTRAST  CT CERVICAL SPINE WITHOUT CONTRAST  TECHNIQUE: Multidetector CT imaging of the head and cervical spine was performed following the standard protocol without intravenous contrast. Multiplanar CT image  reconstructions of the cervical spine were also generated.  COMPARISON:  None.  FINDINGS: CT HEAD FINDINGS  Soft tissue swelling over the right forehead. No acute intracranial abnormality. Specifically, no hemorrhage, hydrocephalus, mass lesion, acute infarction, or significant intracranial injury. No acute calvarial abnormality. Visualized paranasal sinuses and mastoids clear. Orbital soft tissues unremarkable.  CT CERVICAL SPINE FINDINGS  Mild degenerative disc disease diffusely throughout the cervical spine with disc space narrowing and spurring. Mild degenerative facet disease bilaterally. Normal alignment. Prevertebral soft tissues are normal.  Mild bilateral multi level neural foraminal narrowing due to uncovertebral spurring. No fracture. No epidural or paraspinal hematoma. Visualized lung apices clear.  IMPRESSION: No acute intracranial abnormality.  Mild degenerative disc and facet disease throughout the cervical spine with mild bilateral multi level neural foraminal narrowing. No acute bony abnormality.   Electronically Signed   By: Rolm Baptise M.D.   On: 01/27/2015 12:04   Ct Cervical Spine Wo Contrast  01/27/2015   CLINICAL DATA:  Fall last night, bilateral weakness.  EXAM: CT HEAD WITHOUT CONTRAST  CT CERVICAL SPINE WITHOUT CONTRAST  TECHNIQUE: Multidetector CT imaging of the head and cervical spine was performed following the standard protocol without intravenous contrast. Multiplanar CT image reconstructions of the cervical spine were also generated.  COMPARISON:  None.  FINDINGS: CT HEAD FINDINGS  Soft tissue swelling over the right forehead. No acute intracranial abnormality. Specifically, no hemorrhage, hydrocephalus, mass lesion, acute infarction, or significant intracranial injury. No acute calvarial abnormality. Visualized paranasal sinuses and mastoids clear. Orbital soft tissues unremarkable.  CT CERVICAL SPINE FINDINGS  Mild degenerative disc disease diffusely throughout the cervical spine  with disc space narrowing and spurring. Mild degenerative facet disease bilaterally. Normal alignment. Prevertebral soft tissues are normal.  Mild bilateral multi level neural foraminal narrowing due to uncovertebral spurring. No fracture. No epidural or paraspinal hematoma. Visualized lung apices clear.  IMPRESSION: No acute intracranial abnormality.  Mild degenerative disc and facet disease throughout the cervical spine with mild bilateral multi level neural foraminal narrowing. No acute bony abnormality.   Electronically Signed   By: Rolm Baptise M.D.   On: 01/27/2015 12:04   Mr Thoracic Spine Wo Contrast  01/27/2015   CLINICAL DATA:  Severe low back pain. Multiple falls. Weakness in both legs.  EXAM: MRI THORACIC SPINE WITHOUT CONTRAST  TECHNIQUE: Multiplanar, multisequence MR imaging of the thoracic spine was performed. No intravenous contrast was administered.  COMPARISON:  Radiographs dated 01/27/2015  FINDINGS: There is slight hypertrophy of the ligamentum flavum in the midline at T3-4 and T4-5 with no neural impingement.  Tiny central disc protrusion at T9-10 which touches the ventral aspect of the spinal cord but there is no myelopathy.  Hypertrophy of the ligamentum flavum to the right and left at T10-11. Old slight deformity of the superior endplate of I62.  Small subacute or old compression fracture of the anterior superior aspect of T12. It is no disc protrusion.  T12-L1 and L1-2 are normal.  The thoracic spinal cord is normal. Paraspinal  soft tissues are normal.  IMPRESSION: 1. Tiny subacute or old compression fracture of the anterior superior aspect of T12 with no neural impingement. 2. Old deformity of the superior endplate of J82 with no neural impingement. 3. Small central disc protrusion at T9-10 with no neural impingement.   Electronically Signed   By: Lorriane Shire M.D.   On: 01/27/2015 16:15   Mr Lumbar Spine Wo Contrast  01/27/2015   CLINICAL DATA:  Multiple falls last night. Back pain  with increased bilateral lower extremity weakness.  EXAM: MRI LUMBAR SPINE WITHOUT CONTRAST  TECHNIQUE: Multiplanar, multisequence MR imaging of the lumbar spine was performed. No intravenous contrast was administered.  COMPARISON:  Radiographs dated 01/27/2015  FINDINGS: There is an acute subtle compression fracture of the superior endplate of L4 asymmetric to the left. The fracture does not involve the posterior margin of the vertebral body. There is no bone or disc protrusion into the spinal canal at that level.  Normal conus tip at L1-2.  T12-L1:  Normal.  L1-2:  Tiny broad-based disc bulge with no neural impingement.  L2-3:  Tiny broad-based disc bulge with no neural impingement.  L3-4 disc:  Normal disc.  Slight bilateral facet arthritis.  L4-5: Slight broad-based disc bulge with slight narrowing of the left lateral recess which could affect the left L5 nerve.  L5-S1:  Small broad-based disc bulge with no neural impingement.  IMPRESSION: 1. Acute benign appearing compression fracture of the of the superior endplate of L4 asymmetric to the left. No neural impingement at that level. 2. Left lateral recess compression at L4-5 which could affect the left L5 nerve.   Electronically Signed   By: Lorriane Shire M.D.   On: 01/27/2015 16:35   Ct Foot Left Wo Contrast  01/28/2015   CLINICAL DATA:  Multiple falls. Assess known left foot fracture. Initial encounter.  EXAM: CT OF THE LEFT FOOT WITHOUT CONTRAST  TECHNIQUE: Multidetector CT imaging of the left foot was performed according to the standard protocol. Multiplanar CT image reconstructions were also generated.  COMPARISON:  Left foot radiographs performed 01/27/2015  FINDINGS: There are minimally displaced fractures involving the bases of the second, third and fourth metatarsals, with mild comminution noted at all three fractures, particularly at the second metatarsal fracture. There is also minimal fragmentation involving the lateral aspect of the medial  cuneiform. A tiny osseous fragment is seen arising at the medial aspect of the base of the first metatarsal, which may reflect a small avulsion injury, with mild lateral subluxation of the first metatarsal. A small osseous fragment is noted arising at the lateral aspect of the lateral cuneiform.  Fractures extend to the tarsometatarsal articulations at the first, second, third and fourth tarsometatarsal joints.  No significant widening is noted between the bases of the first and second metatarsals, suggesting against a significant Lisfranc injury. No additional fractures are seen.  Mild dorsal soft tissue swelling is noted about the midfoot. Visualized flexor and extensor tendons appear grossly intact. The peroneal tendons are grossly unremarkable in appearance. An os naviculare is noted.  IMPRESSION: 1. Minimally displaced fractures involving the bases of the second, third and fourth metatarsals, with mild comminution at all three fractures, particularly at the second metatarsal fracture. Associated intra-articular extension noted. 2. Minimal fragmentation involving the lateral aspect of the medial cuneiform. 3. Tiny osseous fragment arising at the medial aspect of the base of the first metatarsal may reflect a small avulsion injury, with mild lateral subluxation of the first  metatarsal. 4. Small osseous fragment seen arising at the lateral aspect of the lateral cuneiform. 5. No significant widening between the bases of the first and second metatarsals, suggesting against a significant Lisfranc injury. 6. Mild dorsal soft tissue swelling noted about the midfoot. 7. Os naviculare noted.   Electronically Signed   By: Garald Balding M.D.   On: 01/28/2015 19:53   Dg Foot Complete Left  01/27/2015   CLINICAL DATA:  Pain and swelling secondary to a fall.  EXAM: LEFT FOOT - COMPLETE 3+ VIEW  COMPARISON:  None.  FINDINGS: There fractures of the base of the second and fourth metatarsals. The fracture of the second  metatarsal bases at the Lisfranc ligament attachment. There is abnormal widening of the joint space between the medial and middle cuneiforms. This is consistent with a Lisfranc injury. CT scan recommended for further evaluation since I suspect there other fractures there are not visible on these radiographs.  There small avulsion fractures from the tip of the medial malleolus which I suspect are acute.  IMPRESSION: 1. Lisfranc fracture at the base of the second metatarsal. Fracture of the base of the fourth metatarsal. CT scan recommended for further evaluation of the midfoot. 2. Small probably acute avulsion fractures of the tip of the medial malleolus.   Electronically Signed   By: Lorriane Shire M.D.   On: 01/27/2015 13:39   Dg Foot Complete Right  01/27/2015   CLINICAL DATA:  Fall today. RIGHT foot pain. Initial encounter. Pain with movement.  EXAM: RIGHT FOOT COMPLETE - 3+ VIEW  COMPARISON:  None.  FINDINGS: Transverse fractures through the metatarsal necks of the second, third, and fourth metatarsals are present. The second and third metatarsal neck fractures are mildly displaced, with mild medial angulation. The fourth metatarsal neck fracture is comminuted and moderately displaced. Comminuted fractures extend into the medial margin of the RIGHT fourth metatarsal head but there is no articular surface step-off deformity. Maximal displacement of fourth metatarsal head and neck fragments is 3 mm.  IMPRESSION: Transverse second, third, and comminuted fourth metatarsal neck fractures. Fourth metatarsal neck fracture is comminute.   Electronically Signed   By: Dereck Ligas M.D.   On: 01/27/2015 18:39    Assessment/Plan:     Active Problems:   Rhabdomyolysis  Patient will remain on bedrest until evaluation by Dr. Elvina Mattes. Patient may benefit from walking boot or cast instead a posterior splint. I'll defer to Dr. Elvina Mattes for definitive management of foot injuries.    Th[place]ton Park ,  MD 01/29/2015, 9:03 AM

## 2015-01-29 NOTE — Progress Notes (Signed)
PT Cancellation Note  Patient Details Name: Samuel Burke MRN: [id number] DOB: [birth year]   Cancelled Treatment:    Reason Eval/Treat Not Completed: Other (comment) (pt needs [employer] for WB, [employer] hasn't brought them, will re-attempt eval first thing tomorrow)  Patient is LLE NWB and RLE WB with [employer] Boots. PT attempt eval at 12:00 and 1:40 PM however boots are still not available. Will re-attempt PT eval as soon as patient has boots and is available.  Nursing aware and has ordered boots but orthotist hasn't brought them.    Hopkins,Zaviyar Rahal 01/29/2015, 1:37 PM

## 2015-01-29 NOTE — Progress Notes (Signed)
Patient Demographics  Samuel Burke, is a 54 y.o. male   MRN: [id number]   DOB - 12/05/1960  Admit Date - 01/27/2015    Outpatient Primary MD for the patient is Webb Silversmith, NP  Consult requested in the Hospital by Fritzi Mandes, MD, On 01/29/2015    Reason for consult bilateral multiple foot fractures.   With History of -  Past Medical History  Diagnosis Date  . History of colon polyps   . Frequent headaches   . Hypertension   . Spinal stenosis       Past Surgical History  Procedure Laterality Date  . Cholecystectomy  2015    in for   Chief Complaint  Patient presents with  . Fall     HPI  Samuel Burke  is a 54 y.o. male, the patient states he fell out of bed on July 1 and was unable to stand or walk and subsequently laid on the floor for 6 or so hours before making his way to a telephone to call 911. He was alone at that time. He states he did hit his head also when he fell. He states he felt like he was unconscious for some of the time that he was laying on the floor. When he did get to the phone and called 911 was brought to the emergency department. Subsequent evaluation and orthopedic consult revealed fractures to both right and left feet. Patient was noted to have bruising and swelling to both feet.    Review of Systems    In addition to the HPI above,  No Fever-chills, Mild headache Headache, No changes with Vision or hearing, some bruising and mild contusions around his face and head. No problems swallowing food or Liquids, No Chest pain, Cough or Shortness of Breath, No Abdominal pain, No Nausea or Vommitting, Bowel movements are regular, No Blood in stool or Urine, No dysuria, No new skin rashes or bruises, Swelling and pain to both lower extremities. Particularly the right  foot at the base of the metatarsals of the left foot centrally in the midfoot. No new weakness, tingling, numbness in any extremity, no specific new weakness but the patient has a history of spinal stenosis along with muscle and lower leg weakness to both lower extremities. No recent weight gain or loss, No polyuria, polydypsia or polyphagia, No significant Mental Stressors.  A full 10 point Review of Systems was done, except as stated above, all other Review of Systems were negative.   Social History History  Substance Use Topics  . Smoking status: Former Smoker    Quit date: 08/15/2011  . Smokeless tobacco: Never Used  . Alcohol Use: No     Comment: sober--since 2002     Family History Family History  Problem Relation Age of Onset  . Cancer Mother     breast  . Alcohol abuse Father   . Heart disease Father   . Heart disease Brother   . Hypertension Brother  Prior to Admission medications   Medication Sig Start Date End Date Taking? Authorizing Provider  baclofen (LIORESAL) 10 MG tablet Take 10 mg by mouth 2 (two) times daily.   Yes Historical Provider, MD  Cholecalciferol (RA VITAMIN D-3) 2000 UNITS CAPS Take 1 capsule by mouth daily. Pt takes with a 5,000 unit capsule.   Yes Historical Provider, MD  Cholecalciferol (VITAMIN D3) 5000 UNITS CAPS Take 1 capsule by mouth daily. Pt takes with a 2,000 unit capsule.   Yes Historical Provider, MD  diclofenac (VOLTAREN) 50 MG EC tablet Take 50 mg by mouth 2 (two) times daily.   Yes Historical Provider, MD  gabapentin (NEURONTIN) 600 MG tablet Take 600 mg by mouth 3 (three) times daily.   Yes Historical Provider, MD  lisinopril (PRINIVIL,ZESTRIL) 20 MG tablet Take 20 mg by mouth at bedtime.   Yes Historical Provider, MD  morphine (MS CONTIN) 60 MG 12 hr tablet Take 1 tablet (60 mg total) by mouth every 12 (twelve) hours. Patient taking differently: Take 60 mg by mouth every 8 (eight) hours.  10/20/14  Yes Jearld Fenton, NP    LORazepam (ATIVAN) 0.5 MG tablet Take 0.5 mg by mouth at bedtime as needed for sleep.     Historical Provider, MD  morphine (MS CONTIN) 15 MG 12 hr tablet Take 1 tablet (15 mg total) by mouth every 12 (twelve) hours. Patient not taking: Reported on 01/27/2015 10/20/14   Jearld Fenton, NP  Oxycodone HCl 10 MG TABS Take 1 tablet (10 mg total) by mouth every 6 (six) hours. Patient not taking: Reported on 01/27/2015 10/20/14   Jearld Fenton, NP    Anti-infectives    None      Scheduled Meds: . baclofen  10 mg Oral BID  . cholecalciferol  2,000 Units Oral Daily  . cholecalciferol  5,000 Units Oral Daily  . enoxaparin (LOVENOX) injection  40 mg Subcutaneous Q24H  . gabapentin  600 mg Oral TID  . LORazepam  1 mg Intravenous Once  . morphine  60 mg Oral TID   Continuous Infusions: . sodium chloride 85 mL/hr at 01/29/15 0914   PRN Meds:.LORazepam, ondansetron **OR** ondansetron (ZOFRAN) IV, oxyCODONE  Allergies  Allergen Reactions  . Bee Venom Swelling  . Poison Ivy Extract [Extract Of Poison Ivy] Swelling    Physical Exam  Vitals  Blood pressure 96/58, pulse 95, temperature 98.9 F (37.2 C), temperature source Oral, resp. rate 18, height 5\' 6"  (1.676 m), weight 78.971 kg (174 lb 1.6 oz), SpO2 98 %.  Lower Extremity exam:  Vascular: Dorsalis pedis and posterior tibial arteries are palpable at +2 over 4 bilaterally. Vascular status to the toes appears to be intact with good warmth to the digits and good capillary refill times.  Dermatological: No significant lacerations or contusions to either foot. No open wounds.  Neurological: Patient has some muscle weakness and reduced vibratory light touch sensation to both extremities at this point. Some this may be due to the current swelling and injury. Uncertain as to how much is involved with spinal stenosis history. Asked him to move his toes and legs and he is unable do that very effectively but this may be due to the injuries and cells.  Regarding movement because of pain. He can feel some light touch to his toes.  Ortho: Right foot shows swelling ecchymosis and edema to the dorsal aspect of the right foot. Left foot shows ecchymosis swelling and edema to the midfoot region. Some bruising  is noted to both feet along the plantar aspect as well. Digital alignment on the right foot is good there appears to be no abnormal position of the digits. Overall foot alignment on the left foot appears to be within normal limits as well. Both ankles appear to be stable with a good passive range of motion eliciting no pain in the ankle areas. Pain is elicited with palpation to the metatarsal phalangeal joint regions on the right foot and the tarsometatarsal junctions on the left foot.  Data Review  CBC  Recent Labs Lab 01/27/15 1118  WBC 15.6*  HGB 13.4  HCT 40.1  PLT 196  MCV 91.1  MCH 30.5  MCHC 33.4  RDW 13.1  LYMPHSABS 1.4  MONOABS 1.0  EOSABS 0.0  BASOSABS 0.1   ------------------------------------------------------------------------------------------------------------------  Chemistries   Recent Labs Lab 01/27/15 1118 01/28/15 0344  NA 137 139  K 5.5* 5.2*  CL 103 108  CO2 25 21*  GLUCOSE 111* 89  BUN 37* 32*  CREATININE 1.97* 1.52*  CALCIUM 9.6 8.4*  AST  --  73*  ALT  --  36  ALKPHOS  --  52  BILITOT  --  1.3*   ------------------------------------------------------------------------------------------------------------------ estimated creatinine clearance is 53.7 mL/min (by C-G formula based on Cr of 1.52). ------------------------------------------------------------------------------------------------------------------  Recent Labs  01/27/15 1132  TSH 0.475     Coagulation profile No results for input(s): INR, PROTIME in the last 168 hours. ------------------------------------------------------------------------------------------------------------------- No results for input(s): DDIMER in the last  72 hours. -------------------------------------------------------------------------------------------------------------------  Cardiac Enzymes No results for input(s): CKMB, TROPONINI, MYOGLOBIN in the last 168 hours.  Invalid input(s): CK ------------------------------------------------------------------------------------------------------------------ Invalid input(s): POCBNP   ---------------------------------------------------------------------------------------------------------------  Urinalysis No results found for: COLORURINE, APPEARANCEUR, LABSPEC, PHURINE, GLUCOSEU, HGBUR, BILIRUBINUR, KETONESUR, PROTEINUR, UROBILINOGEN, NITRITE, LEUKOCYTESUR   Imaging results:   Dg Chest 1 View  01/27/2015   CLINICAL DATA:  fall, weakness in bilateral le  EXAM: CHEST  1 VIEW  COMPARISON:  None.  FINDINGS: Low volume chest. Bilateral basilar atelectasis. The cardiopericardial silhouette appears enlarged, out with side accentuated by low lung volumes. There is no pneumothorax. Thoracic vertebral body height appears preserved. Cholecystectomy clips are present in the right upper quadrant.  IMPRESSION: Low volume chest. Enlarged cardiopericardial silhouette, likely secondary to low volumes.   Electronically Signed   By: Dereck Ligas M.D.   On: 01/27/2015 13:35   Dg Thoracic Spine 2 View  01/27/2015   CLINICAL DATA:  Fall.  Lower extremity weakness.  EXAM: THORACIC SPINE - 2-3 VIEWS  COMPARISON:  None.  FINDINGS: There is mild depression of the upper endplate of T01. No other evidence of a fracture. No spondylolisthesis. There are minor disc degenerative changes reflected by mild loss of disc height and small endplate osteophytes mostly along the mid thoracic spine.  Soft tissues are unremarkable.  IMPRESSION: 1. Mild depression of the upper endplates of S01 which may reflect a fracture. This could be recent or remote. 2. No other evidence of a fracture.   Electronically Signed   By: Lajean Manes M.D.    On: 01/27/2015 13:35   Dg Lumbar Spine Complete  01/27/2015   CLINICAL DATA:  Fall.  Lower extremity weakness.  EXAM: LUMBAR SPINE - COMPLETE 4+ VIEW  COMPARISON:  None.  FINDINGS: No fracture of the lumbar spine. No spondylolisthesis. Mild loss of disc height from the lower thoracic spine through L4-L5. There are small endplate osteophytes.  Soft tissues are unremarkable.  IMPRESSION: No fracture or acute finding.  Electronically Signed   By: Lajean Manes M.D.   On: 01/27/2015 13:36   Dg Pelvis 1-2 Views  01/27/2015   CLINICAL DATA:  Follow-up. Bilateral lower extremity weakness. Initial encounter.  EXAM: PELVIS - 1-2 VIEW  COMPARISON:  None.  FINDINGS: Pelvic rings appear intact. Proximal femur appears normal bilaterally. Sacral arcades normal. Phleboliths in the anatomic pelvis. Hip joint spaces are symmetric.  IMPRESSION: No acute osseous abnormality.   Electronically Signed   By: Dereck Ligas M.D.   On: 01/27/2015 13:37   Ct Head Wo Contrast  01/27/2015   CLINICAL DATA:  Fall last night, bilateral weakness.  EXAM: CT HEAD WITHOUT CONTRAST  CT CERVICAL SPINE WITHOUT CONTRAST  TECHNIQUE: Multidetector CT imaging of the head and cervical spine was performed following the standard protocol without intravenous contrast. Multiplanar CT image reconstructions of the cervical spine were also generated.  COMPARISON:  None.  FINDINGS: CT HEAD FINDINGS  Soft tissue swelling over the right forehead. No acute intracranial abnormality. Specifically, no hemorrhage, hydrocephalus, mass lesion, acute infarction, or significant intracranial injury. No acute calvarial abnormality. Visualized paranasal sinuses and mastoids clear. Orbital soft tissues unremarkable.  CT CERVICAL SPINE FINDINGS  Mild degenerative disc disease diffusely throughout the cervical spine with disc space narrowing and spurring. Mild degenerative facet disease bilaterally. Normal alignment. Prevertebral soft tissues are normal.  Mild bilateral  multi level neural foraminal narrowing due to uncovertebral spurring. No fracture. No epidural or paraspinal hematoma. Visualized lung apices clear.  IMPRESSION: No acute intracranial abnormality.  Mild degenerative disc and facet disease throughout the cervical spine with mild bilateral multi level neural foraminal narrowing. No acute bony abnormality.   Electronically Signed   By: Rolm Baptise M.D.   On: 01/27/2015 12:04   Ct Cervical Spine Wo Contrast  01/27/2015   CLINICAL DATA:  Fall last night, bilateral weakness.  EXAM: CT HEAD WITHOUT CONTRAST  CT CERVICAL SPINE WITHOUT CONTRAST  TECHNIQUE: Multidetector CT imaging of the head and cervical spine was performed following the standard protocol without intravenous contrast. Multiplanar CT image reconstructions of the cervical spine were also generated.  COMPARISON:  None.  FINDINGS: CT HEAD FINDINGS  Soft tissue swelling over the right forehead. No acute intracranial abnormality. Specifically, no hemorrhage, hydrocephalus, mass lesion, acute infarction, or significant intracranial injury. No acute calvarial abnormality. Visualized paranasal sinuses and mastoids clear. Orbital soft tissues unremarkable.  CT CERVICAL SPINE FINDINGS  Mild degenerative disc disease diffusely throughout the cervical spine with disc space narrowing and spurring. Mild degenerative facet disease bilaterally. Normal alignment. Prevertebral soft tissues are normal.  Mild bilateral multi level neural foraminal narrowing due to uncovertebral spurring. No fracture. No epidural or paraspinal hematoma. Visualized lung apices clear.  IMPRESSION: No acute intracranial abnormality.  Mild degenerative disc and facet disease throughout the cervical spine with mild bilateral multi level neural foraminal narrowing. No acute bony abnormality.   Electronically Signed   By: Rolm Baptise M.D.   On: 01/27/2015 12:04   Mr Thoracic Spine Wo Contrast  01/27/2015   CLINICAL DATA:  Severe low back pain.  Multiple falls. Weakness in both legs.  EXAM: MRI THORACIC SPINE WITHOUT CONTRAST  TECHNIQUE: Multiplanar, multisequence MR imaging of the thoracic spine was performed. No intravenous contrast was administered.  COMPARISON:  Radiographs dated 01/27/2015  FINDINGS: There is slight hypertrophy of the ligamentum flavum in the midline at T3-4 and T4-5 with no neural impingement.  Tiny central disc protrusion at T9-10 which touches the ventral aspect of the  spinal cord but there is no myelopathy.  Hypertrophy of the ligamentum flavum to the right and left at T10-11. Old slight deformity of the superior endplate of X32.  Small subacute or old compression fracture of the anterior superior aspect of T12. It is no disc protrusion.  T12-L1 and L1-2 are normal.  The thoracic spinal cord is normal. Paraspinal soft tissues are normal.  IMPRESSION: 1. Tiny subacute or old compression fracture of the anterior superior aspect of T12 with no neural impingement. 2. Old deformity of the superior endplate of T55 with no neural impingement. 3. Small central disc protrusion at T9-10 with no neural impingement.   Electronically Signed   By: Lorriane Shire M.D.   On: 01/27/2015 16:15   Mr Lumbar Spine Wo Contrast  01/27/2015   CLINICAL DATA:  Multiple falls last night. Back pain with increased bilateral lower extremity weakness.  EXAM: MRI LUMBAR SPINE WITHOUT CONTRAST  TECHNIQUE: Multiplanar, multisequence MR imaging of the lumbar spine was performed. No intravenous contrast was administered.  COMPARISON:  Radiographs dated 01/27/2015  FINDINGS: There is an acute subtle compression fracture of the superior endplate of L4 asymmetric to the left. The fracture does not involve the posterior margin of the vertebral body. There is no bone or disc protrusion into the spinal canal at that level.  Normal conus tip at L1-2.  T12-L1:  Normal.  L1-2:  Tiny broad-based disc bulge with no neural impingement.  L2-3:  Tiny broad-based disc bulge with  no neural impingement.  L3-4 disc:  Normal disc.  Slight bilateral facet arthritis.  L4-5: Slight broad-based disc bulge with slight narrowing of the left lateral recess which could affect the left L5 nerve.  L5-S1:  Small broad-based disc bulge with no neural impingement.  IMPRESSION: 1. Acute benign appearing compression fracture of the of the superior endplate of L4 asymmetric to the left. No neural impingement at that level. 2. Left lateral recess compression at L4-5 which could affect the left L5 nerve.   Electronically Signed   By: Lorriane Shire M.D.   On: 01/27/2015 16:35   Ct Foot Left Wo Contrast  01/28/2015   CLINICAL DATA:  Multiple falls. Assess known left foot fracture. Initial encounter.  EXAM: CT OF THE LEFT FOOT WITHOUT CONTRAST  TECHNIQUE: Multidetector CT imaging of the left foot was performed according to the standard protocol. Multiplanar CT image reconstructions were also generated.  COMPARISON:  Left foot radiographs performed 01/27/2015  FINDINGS: There are minimally displaced fractures involving the bases of the second, third and fourth metatarsals, with mild comminution noted at all three fractures, particularly at the second metatarsal fracture. There is also minimal fragmentation involving the lateral aspect of the medial cuneiform. A tiny osseous fragment is seen arising at the medial aspect of the base of the first metatarsal, which may reflect a small avulsion injury, with mild lateral subluxation of the first metatarsal. A small osseous fragment is noted arising at the lateral aspect of the lateral cuneiform.  Fractures extend to the tarsometatarsal articulations at the first, second, third and fourth tarsometatarsal joints.  No significant widening is noted between the bases of the first and second metatarsals, suggesting against a significant Lisfranc injury. No additional fractures are seen.  Mild dorsal soft tissue swelling is noted about the midfoot. Visualized flexor and  extensor tendons appear grossly intact. The peroneal tendons are grossly unremarkable in appearance. An os naviculare is noted.  IMPRESSION: 1. Minimally displaced fractures involving the bases of the  second, third and fourth metatarsals, with mild comminution at all three fractures, particularly at the second metatarsal fracture. Associated intra-articular extension noted. 2. Minimal fragmentation involving the lateral aspect of the medial cuneiform. 3. Tiny osseous fragment arising at the medial aspect of the base of the first metatarsal may reflect a small avulsion injury, with mild lateral subluxation of the first metatarsal. 4. Small osseous fragment seen arising at the lateral aspect of the lateral cuneiform. 5. No significant widening between the bases of the first and second metatarsals, suggesting against a significant Lisfranc injury. 6. Mild dorsal soft tissue swelling noted about the midfoot. 7. Os naviculare noted.   Electronically Signed   By: Garald Balding M.D.   On: 01/28/2015 19:53   Dg Foot Complete Left  01/27/2015   CLINICAL DATA:  Pain and swelling secondary to a fall.  EXAM: LEFT FOOT - COMPLETE 3+ VIEW  COMPARISON:  None.  FINDINGS: There fractures of the base of the second and fourth metatarsals. The fracture of the second metatarsal bases at the Lisfranc ligament attachment. There is abnormal widening of the joint space between the medial and middle cuneiforms. This is consistent with a Lisfranc injury. CT scan recommended for further evaluation since I suspect there other fractures there are not visible on these radiographs.  There small avulsion fractures from the tip of the medial malleolus which I suspect are acute.  IMPRESSION: 1. Lisfranc fracture at the base of the second metatarsal. Fracture of the base of the fourth metatarsal. CT scan recommended for further evaluation of the midfoot. 2. Small probably acute avulsion fractures of the tip of the medial malleolus.   Electronically  Signed   By: Lorriane Shire M.D.   On: 01/27/2015 13:39   Dg Foot Complete Right  01/27/2015   CLINICAL DATA:  Fall today. RIGHT foot pain. Initial encounter. Pain with movement.  EXAM: RIGHT FOOT COMPLETE - 3+ VIEW  COMPARISON:  None.  FINDINGS: Transverse fractures through the metatarsal necks of the second, third, and fourth metatarsals are present. The second and third metatarsal neck fractures are mildly displaced, with mild medial angulation. The fourth metatarsal neck fracture is comminuted and moderately displaced. Comminuted fractures extend into the medial margin of the RIGHT fourth metatarsal head but there is no articular surface step-off deformity. Maximal displacement of fourth metatarsal head and neck fragments is 3 mm.  IMPRESSION: Transverse second, third, and comminuted fourth metatarsal neck fractures. Fourth metatarsal neck fracture is comminute.   Electronically Signed   By: Dereck Ligas M.D.   On: 01/27/2015 18:39        Assessment & Plan:  Radiographs of the right foot reviewed as well as radiographs and CT of the left foot. 3 views of the right foot show fractures to the second third and fourth metatarsals at the neck areas of the 3 metatarsals. There is some comminution to the fourth metatarsal in particular but the alignment of the second third and fourth metatarsals is acceptable at this point. The metatarsal parabola is appropriate and joint spaces appear to be intact on regular x-ray. Some possibility of joint involvement was based on the x-ray reading. Surgical approach would likely not offer any type of improvement to the situation for the right foot.  CT scan of the left foot shows fractures to the second third and fourth metatarsal bases. A small avulsion fracture is noted on the first metatarsal with very minimal lateral displacement of the metatarsal cuneiform joint. Minimal displacement is also  noted to the second third and fourth tarsometatarsal joints. Some  comminution and joint involvement is noted especially with the second metatarsal cuneiform joint. Alignment however looks to be within normal limits and is not suggestive of a Lisfranc type injury.  Neither foot think needs surgical intervention at this point. He does however need appropriate immobilization. The right foot we will boot with a CAM walking boot to offload in the stabilize the metatarsal fractures. The left foot I would recommend staying nonweightbearing but immobilization with a boot would be fine with that too. He should be on get to the point where he can transfer from bed to chair and vice versa and put some weight on his right foot to achieve that. I would not recommend any excessive walking or standing or taking steps with that foot only transfer at this timeframe. We'll consult physical therapy to achieve this.  CT scan of his head was negative. CT scan of his lumbar spine was positive for stenosis and acute vertebral fracture to the endplate of L4 with no neural impingement.    Family Communication: Plan discussed with patient and his sister.   Thank you for the consult, we will follow the patient with you in the Hospital.   Perry Mount M.D on 01/29/2015 at 9:57 AM  Thank you for the consult, we will follow the patient with you in the Hospital.

## 2015-01-29 NOTE — Progress Notes (Signed)
[facility] NAME: Samuel Burke    MR#:  [id number]  DATE OF BIRTH:  [birth year]  SUBJECTIVE:  Came in from home after had an accidental fall.  Denies any other complaints REVIEW OF SYSTEMS:   Review of Systems  Constitutional: Negative for fever, chills and weight loss.  HENT: Negative for ear discharge, ear pain and nosebleeds.   Eyes: Negative for blurred vision, pain and discharge.  Respiratory: Negative for sputum production, shortness of breath, wheezing and stridor.   Cardiovascular: Negative for chest pain, palpitations, orthopnea and PND.  Gastrointestinal: Negative for nausea, vomiting, abdominal pain and diarrhea.  Genitourinary: Negative for urgency and frequency.  Musculoskeletal: Positive for back pain, joint pain and falls.  Neurological: Positive for weakness. Negative for sensory change, speech change and focal weakness.  Psychiatric/Behavioral: Negative for depression. The patient is not nervous/anxious.   All other systems reviewed and are negative.  Tolerating Diet:yes   DRUG ALLERGIES:   Allergies  Allergen Reactions  . Bee Venom Swelling  . Poison Ivy Extract [Extract Of Poison Ivy] Swelling    VITALS:  Blood pressure 96/58, pulse 95, temperature 98.9 F (37.2 C), temperature source Oral, resp. rate 18, height 5\' 6"  (1.676 m), weight 78.971 kg (174 lb 1.6 oz), SpO2 98 %.  PHYSICAL EXAMINATION:   Physical Exam  GENERAL:  54 y.o.-year-old patient lying in the bed with no acute distress.  EYES: Pupils equal, round, reactive to light and accommodation. No scleral icterus. Extraocular muscles intact.  HEENT: Head atraumatic, normocephalic. Oropharynx and nasopharynx clear. Brusie+ over the forehaed NECK:  Supple, no jugular venous distention. No thyroid enlargement, no tenderness.  LUNGS: Normal breath sounds bilaterally, no wheezing, rales, rhonchi. No use of accessory muscles of respiration.   CARDIOVASCULAR: S1, S2 normal. No murmurs, rubs, or gallops.  ABDOMEN: Soft, nontender, nondistended. Bowel sounds present. No organomegaly or mass.  EXTREMITIES: No cyanosis, clubbing or edema b/l.    NEUROLOGIC: Cranial nerves II through XII are intact. No focal Motor or sensory deficits b/l.   PSYCHIATRIC: The patient is alert and oriented x 3.  SKIN: bruises + in feet, laceration over the right elbow   LABORATORY PANEL:   CBC  Recent Labs Lab 01/27/15 1118  WBC 15.6*  HGB 13.4  HCT 40.1  PLT 196    Chemistries   Recent Labs Lab 01/28/15 0344  NA 139  K 5.2*  CL 108  CO2 21*  GLUCOSE 89  BUN 32*  CREATININE 1.52*  CALCIUM 8.4*  AST 73*  ALT 36  ALKPHOS 52  BILITOT 1.3*    Cardiac Enzymes No results for input(s): TROPONINI in the last 168 hours.  RADIOLOGY:  Dg Chest 1 View  01/27/2015   CLINICAL DATA:  fall, weakness in bilateral le  EXAM: CHEST  1 VIEW  COMPARISON:  None.  FINDINGS: Low volume chest. Bilateral basilar atelectasis. The cardiopericardial silhouette appears enlarged, out with side accentuated by low lung volumes. There is no pneumothorax. Thoracic vertebral body height appears preserved. Cholecystectomy clips are present in the right upper quadrant.  IMPRESSION: Low volume chest. Enlarged cardiopericardial silhouette, likely secondary to low volumes.   Electronically Signed   By: Dereck Ligas M.D.   On: 01/27/2015 13:35   Dg Thoracic Spine 2 View  01/27/2015   CLINICAL DATA:  Fall.  Lower extremity weakness.  EXAM: THORACIC SPINE - 2-3 VIEWS  COMPARISON:  None.  FINDINGS: There is mild depression of  the upper endplate of X54. No other evidence of a fracture. No spondylolisthesis. There are minor disc degenerative changes reflected by mild loss of disc height and small endplate osteophytes mostly along the mid thoracic spine.  Soft tissues are unremarkable.  IMPRESSION: 1. Mild depression of the upper endplates of M08 which may reflect a fracture.  This could be recent or remote. 2. No other evidence of a fracture.   Electronically Signed   By: Lajean Manes M.D.   On: 01/27/2015 13:35   Dg Lumbar Spine Complete  01/27/2015   CLINICAL DATA:  Fall.  Lower extremity weakness.  EXAM: LUMBAR SPINE - COMPLETE 4+ VIEW  COMPARISON:  None.  FINDINGS: No fracture of the lumbar spine. No spondylolisthesis. Mild loss of disc height from the lower thoracic spine through L4-L5. There are small endplate osteophytes.  Soft tissues are unremarkable.  IMPRESSION: No fracture or acute finding.   Electronically Signed   By: Lajean Manes M.D.   On: 01/27/2015 13:36   Dg Pelvis 1-2 Views  01/27/2015   CLINICAL DATA:  Follow-up. Bilateral lower extremity weakness. Initial encounter.  EXAM: PELVIS - 1-2 VIEW  COMPARISON:  None.  FINDINGS: Pelvic rings appear intact. Proximal femur appears normal bilaterally. Sacral arcades normal. Phleboliths in the anatomic pelvis. Hip joint spaces are symmetric.  IMPRESSION: No acute osseous abnormality.   Electronically Signed   By: Dereck Ligas M.D.   On: 01/27/2015 13:37   Ct Head Wo Contrast  01/27/2015   CLINICAL DATA:  Fall last night, bilateral weakness.  EXAM: CT HEAD WITHOUT CONTRAST  CT CERVICAL SPINE WITHOUT CONTRAST  TECHNIQUE: Multidetector CT imaging of the head and cervical spine was performed following the standard protocol without intravenous contrast. Multiplanar CT image reconstructions of the cervical spine were also generated.  COMPARISON:  None.  FINDINGS: CT HEAD FINDINGS  Soft tissue swelling over the right forehead. No acute intracranial abnormality. Specifically, no hemorrhage, hydrocephalus, mass lesion, acute infarction, or significant intracranial injury. No acute calvarial abnormality. Visualized paranasal sinuses and mastoids clear. Orbital soft tissues unremarkable.  CT CERVICAL SPINE FINDINGS  Mild degenerative disc disease diffusely throughout the cervical spine with disc space narrowing and spurring.  Mild degenerative facet disease bilaterally. Normal alignment. Prevertebral soft tissues are normal.  Mild bilateral multi level neural foraminal narrowing due to uncovertebral spurring. No fracture. No epidural or paraspinal hematoma. Visualized lung apices clear.  IMPRESSION: No acute intracranial abnormality.  Mild degenerative disc and facet disease throughout the cervical spine with mild bilateral multi level neural foraminal narrowing. No acute bony abnormality.   Electronically Signed   By: Rolm Baptise M.D.   On: 01/27/2015 12:04   Ct Cervical Spine Wo Contrast  01/27/2015   CLINICAL DATA:  Fall last night, bilateral weakness.  EXAM: CT HEAD WITHOUT CONTRAST  CT CERVICAL SPINE WITHOUT CONTRAST  TECHNIQUE: Multidetector CT imaging of the head and cervical spine was performed following the standard protocol without intravenous contrast. Multiplanar CT image reconstructions of the cervical spine were also generated.  COMPARISON:  None.  FINDINGS: CT HEAD FINDINGS  Soft tissue swelling over the right forehead. No acute intracranial abnormality. Specifically, no hemorrhage, hydrocephalus, mass lesion, acute infarction, or significant intracranial injury. No acute calvarial abnormality. Visualized paranasal sinuses and mastoids clear. Orbital soft tissues unremarkable.  CT CERVICAL SPINE FINDINGS  Mild degenerative disc disease diffusely throughout the cervical spine with disc space narrowing and spurring. Mild degenerative facet disease bilaterally. Normal alignment. Prevertebral soft tissues are normal.  Mild bilateral multi level neural foraminal narrowing due to uncovertebral spurring. No fracture. No epidural or paraspinal hematoma. Visualized lung apices clear.  IMPRESSION: No acute intracranial abnormality.  Mild degenerative disc and facet disease throughout the cervical spine with mild bilateral multi level neural foraminal narrowing. No acute bony abnormality.   Electronically Signed   By: Rolm Baptise  M.D.   On: 01/27/2015 12:04   Mr Thoracic Spine Wo Contrast  01/27/2015   CLINICAL DATA:  Severe low back pain. Multiple falls. Weakness in both legs.  EXAM: MRI THORACIC SPINE WITHOUT CONTRAST  TECHNIQUE: Multiplanar, multisequence MR imaging of the thoracic spine was performed. No intravenous contrast was administered.  COMPARISON:  Radiographs dated 01/27/2015  FINDINGS: There is slight hypertrophy of the ligamentum flavum in the midline at T3-4 and T4-5 with no neural impingement.  Tiny central disc protrusion at T9-10 which touches the ventral aspect of the spinal cord but there is no myelopathy.  Hypertrophy of the ligamentum flavum to the right and left at T10-11. Old slight deformity of the superior endplate of K53.  Small subacute or old compression fracture of the anterior superior aspect of T12. It is no disc protrusion.  T12-L1 and L1-2 are normal.  The thoracic spinal cord is normal. Paraspinal soft tissues are normal.  IMPRESSION: 1. Tiny subacute or old compression fracture of the anterior superior aspect of T12 with no neural impingement. 2. Old deformity of the superior endplate of Z76 with no neural impingement. 3. Small central disc protrusion at T9-10 with no neural impingement.   Electronically Signed   By: Lorriane Shire M.D.   On: 01/27/2015 16:15   Mr Lumbar Spine Wo Contrast  01/27/2015   CLINICAL DATA:  Multiple falls last night. Back pain with increased bilateral lower extremity weakness.  EXAM: MRI LUMBAR SPINE WITHOUT CONTRAST  TECHNIQUE: Multiplanar, multisequence MR imaging of the lumbar spine was performed. No intravenous contrast was administered.  COMPARISON:  Radiographs dated 01/27/2015  FINDINGS: There is an acute subtle compression fracture of the superior endplate of L4 asymmetric to the left. The fracture does not involve the posterior margin of the vertebral body. There is no bone or disc protrusion into the spinal canal at that level.  Normal conus tip at L1-2.  T12-L1:   Normal.  L1-2:  Tiny broad-based disc bulge with no neural impingement.  L2-3:  Tiny broad-based disc bulge with no neural impingement.  L3-4 disc:  Normal disc.  Slight bilateral facet arthritis.  L4-5: Slight broad-based disc bulge with slight narrowing of the left lateral recess which could affect the left L5 nerve.  L5-S1:  Small broad-based disc bulge with no neural impingement.  IMPRESSION: 1. Acute benign appearing compression fracture of the of the superior endplate of L4 asymmetric to the left. No neural impingement at that level. 2. Left lateral recess compression at L4-5 which could affect the left L5 nerve.   Electronically Signed   By: Lorriane Shire M.D.   On: 01/27/2015 16:35   Ct Foot Left Wo Contrast  01/28/2015   CLINICAL DATA:  Multiple falls. Assess known left foot fracture. Initial encounter.  EXAM: CT OF THE LEFT FOOT WITHOUT CONTRAST  TECHNIQUE: Multidetector CT imaging of the left foot was performed according to the standard protocol. Multiplanar CT image reconstructions were also generated.  COMPARISON:  Left foot radiographs performed 01/27/2015  FINDINGS: There are minimally displaced fractures involving the bases of the second, third and fourth metatarsals, with mild comminution noted  at all three fractures, particularly at the second metatarsal fracture. There is also minimal fragmentation involving the lateral aspect of the medial cuneiform. A tiny osseous fragment is seen arising at the medial aspect of the base of the first metatarsal, which may reflect a small avulsion injury, with mild lateral subluxation of the first metatarsal. A small osseous fragment is noted arising at the lateral aspect of the lateral cuneiform.  Fractures extend to the tarsometatarsal articulations at the first, second, third and fourth tarsometatarsal joints.  No significant widening is noted between the bases of the first and second metatarsals, suggesting against a significant Lisfranc injury. No  additional fractures are seen.  Mild dorsal soft tissue swelling is noted about the midfoot. Visualized flexor and extensor tendons appear grossly intact. The peroneal tendons are grossly unremarkable in appearance. An os naviculare is noted.  IMPRESSION: 1. Minimally displaced fractures involving the bases of the second, third and fourth metatarsals, with mild comminution at all three fractures, particularly at the second metatarsal fracture. Associated intra-articular extension noted. 2. Minimal fragmentation involving the lateral aspect of the medial cuneiform. 3. Tiny osseous fragment arising at the medial aspect of the base of the first metatarsal may reflect a small avulsion injury, with mild lateral subluxation of the first metatarsal. 4. Small osseous fragment seen arising at the lateral aspect of the lateral cuneiform. 5. No significant widening between the bases of the first and second metatarsals, suggesting against a significant Lisfranc injury. 6. Mild dorsal soft tissue swelling noted about the midfoot. 7. Os naviculare noted.   Electronically Signed   By: Garald Balding M.D.   On: 01/28/2015 19:53   Dg Foot Complete Left  01/27/2015   CLINICAL DATA:  Pain and swelling secondary to a fall.  EXAM: LEFT FOOT - COMPLETE 3+ VIEW  COMPARISON:  None.  FINDINGS: There fractures of the base of the second and fourth metatarsals. The fracture of the second metatarsal bases at the Lisfranc ligament attachment. There is abnormal widening of the joint space between the medial and middle cuneiforms. This is consistent with a Lisfranc injury. CT scan recommended for further evaluation since I suspect there other fractures there are not visible on these radiographs.  There small avulsion fractures from the tip of the medial malleolus which I suspect are acute.  IMPRESSION: 1. Lisfranc fracture at the base of the second metatarsal. Fracture of the base of the fourth metatarsal. CT scan recommended for further  evaluation of the midfoot. 2. Small probably acute avulsion fractures of the tip of the medial malleolus.   Electronically Signed   By: Lorriane Shire M.D.   On: 01/27/2015 13:39   Dg Foot Complete Right  01/27/2015   CLINICAL DATA:  Fall today. RIGHT foot pain. Initial encounter. Pain with movement.  EXAM: RIGHT FOOT COMPLETE - 3+ VIEW  COMPARISON:  None.  FINDINGS: Transverse fractures through the metatarsal necks of the second, third, and fourth metatarsals are present. The second and third metatarsal neck fractures are mildly displaced, with mild medial angulation. The fourth metatarsal neck fracture is comminuted and moderately displaced. Comminuted fractures extend into the medial margin of the RIGHT fourth metatarsal head but there is no articular surface step-off deformity. Maximal displacement of fourth metatarsal head and neck fragments is 3 mm.  IMPRESSION: Transverse second, third, and comminuted fourth metatarsal neck fractures. Fourth metatarsal neck fracture is comminute.   Electronically Signed   By: Dereck Ligas M.D.   On: 01/27/2015 18:39  ASSESSMENT AND PLAN:    55 year old white male with h/o spinal stenosis on chronci high dose po morphinewho presents with multiple falls is noted to have rhabdomyolysis and a foot fracture   1.Acute habdomyolysis s/p fall -cont aggressive IV fluids. -CPK trending down 7K-->3K-->1000  2.  Bilateral foot injury s/p fall -evaluation by Dr Raliegh Ip and Troxlers appreciated -non surgical fractures -resume PT per Dr troxlers input -SW for rehab planning   3. Acute renal failure: Hold lisinopril  -improving -cont IVF  4. Hypertension we'll hold lisinopril, monitor blood pressure is borderline normal   Case discussed with Care Management/Social Worker. Management plans discussed with the patient, family and they are in agreement.  CODE STATUS:full  DVT Prophylaxis:lovenox  TOTAL TIME TAKING CARE OF THIS PATIENT:35 minutes.  >50% time  spent on counselling and coordination of care  POSSIBLE D/C IN 2-3 DAYS, DEPENDING ON CLINICAL CONDITION.   Franny Selvage M.D on 01/29/2015 at 10:40 AM  Between 7am to 6pm - Pager - [phone]  After 6pm go to [email or web address] - password [email or web address] [facility]  [facility]  Office  [phone]  CC: Primary care physician; Webb Silversmith, NP

## 2015-01-30 NOTE — Progress Notes (Signed)
Patient cooperative with wearing CAM boots and lumbar corsett.  Complains of pain that is being managed by PO pain medication.  Up to chair with PT while wearing CAM boots, NWB to left foot.

## 2015-01-30 NOTE — Clinical Social Work Note (Signed)
Clinical Social Work Assessment  Patient Details  Name: Samuel Burke MRN: [id number] Date of Birth: Jan 06, 1961  Date of referral:  01/30/15               Reason for consult:  Facility Placement                Permission sought to share information with:  Chartered certified accountant granted to share information::  Yes, Verbal Permission Granted  Name::      Samuel::   Burke   Relationship::     Contact Information:     Housing/Transportation Living arrangements for the past 2 months:  [place] of Information:  Patient Patient Interpreter Needed:  None Criminal Activity/Legal Involvement Pertinent to Current Situation/Hospitalization:  No - Comment as needed Significant Relationships:  Siblings, Parents Lives with:  Parents, Siblings Do you feel safe going back to the place where you live?  Yes Need for family participation in patient care:  Yes (Comment)  Care giving concerns:  Patient lives with his mother and brother in [place].    Social Worker assessment / plan:  Holiday representative (CSW) received SNF consult. CSW met with patient to address consult. CSW introduced self and explained role of CSW department. Patient was sitting up in the chair. Patient was pleasant and answered questions appropriately. Patient reported that he is on disability and lives in [place] with his mother Samuel Burke and brother Samuel Burke. Patient's sister Samuel Burke [phone] is HPOA. CSW explained SNF process. Patient is agreeable to SNF search in [facility]. Patient is not familiar with facilities and has no preference. SNF list was provided. Patient requested that CSW contact his sister Samuel Burke.   CSW contacted Samuel Burke and discussed D/C plan. Samuel Burke is agreeable to SNF search.    Employment status:  Disabled (Comment on whether or not currently receiving Disability) Insurance information:  Managed Medicare PT Recommendations:  [place] / Referral to community resources:  [place]  Patient/Family's Response to care:  Patient and sister are agreeable to [facility].   Patient/Family's Understanding of and Emotional Response to Diagnosis, Current Treatment, and Prognosis: Patient was pleasant and thanked CSW for visit.   Emotional Assessment Appearance:  Appears older than stated age Attitude/Demeanor/Rapport:    Affect (typically observed):  Pleasant, Accepting Orientation:  Oriented to Self, Oriented to Place, Oriented to  Time, Oriented to Situation Alcohol / Substance use:  Not Applicable Psych involvement (Current and /or in the community):  No (Comment)  Discharge Needs  Concerns to be addressed:  Discharge Planning Concerns Readmission within the last 30 days:  No Current discharge risk:  Chronically ill Barriers to Discharge:  Continued Medical Work up   Samuel Freshwater, LCSW 01/30/2015, 4:27 PM

## 2015-01-30 NOTE — Progress Notes (Signed)
Subjective:  Patient up out of bed to a chair today.  Patient reports pain as moderate.  He is in bilateral cam walker boots. I reviewed Dr. Selina Cooley note. Patient is able to bear weight on the right lower extremity for transfers from bed to chair only. He is not to be ambulating. No operative intervention is planned at this time.  Objective:   VITALS:   Filed Vitals:   01/29/15 1953 01/30/15 0433 01/30/15 0437 01/30/15 0738  BP: 116/69  94/57 107/76  Pulse: 91 92 88 90  Temp: 98.2 F (36.8 C) 98.6 F (37 C)  98.2 F (36.8 C)  TempSrc: Oral Oral  Oral  Resp: 18 17  18   Height:      Weight:      SpO2: 99% 97%  97%   Bilateral lower extremities: Patient is sitting in a chair with both legs elevated. Cam Walker boots are in place. Toes are well-perfused. He can flex and extend all toes without significant pain. Neurologically intact Neurovascular intact Sensation intact distally Intact pulses distally Dorsiflexion/Plantar flexion intact Compartment soft  LABS  No results found for this or any previous visit (from the past 24 hour(s)).  Ct Foot Left Wo Contrast  01/28/2015   CLINICAL DATA:  Multiple falls. Assess known left foot fracture. Initial encounter.  EXAM: CT OF THE LEFT FOOT WITHOUT CONTRAST  TECHNIQUE: Multidetector CT imaging of the left foot was performed according to the standard protocol. Multiplanar CT image reconstructions were also generated.  COMPARISON:  Left foot radiographs performed 01/27/2015  FINDINGS: There are minimally displaced fractures involving the bases of the second, third and fourth metatarsals, with mild comminution noted at all three fractures, particularly at the second metatarsal fracture. There is also minimal fragmentation involving the lateral aspect of the medial cuneiform. A tiny osseous fragment is seen arising at the medial aspect of the base of the first metatarsal, which may reflect a small avulsion injury, with mild lateral subluxation  of the first metatarsal. A small osseous fragment is noted arising at the lateral aspect of the lateral cuneiform.  Fractures extend to the tarsometatarsal articulations at the first, second, third and fourth tarsometatarsal joints.  No significant widening is noted between the bases of the first and second metatarsals, suggesting against a significant Lisfranc injury. No additional fractures are seen.  Mild dorsal soft tissue swelling is noted about the midfoot. Visualized flexor and extensor tendons appear grossly intact. The peroneal tendons are grossly unremarkable in appearance. An os naviculare is noted.  IMPRESSION: 1. Minimally displaced fractures involving the bases of the second, third and fourth metatarsals, with mild comminution at all three fractures, particularly at the second metatarsal fracture. Associated intra-articular extension noted. 2. Minimal fragmentation involving the lateral aspect of the medial cuneiform. 3. Tiny osseous fragment arising at the medial aspect of the base of the first metatarsal may reflect a small avulsion injury, with mild lateral subluxation of the first metatarsal. 4. Small osseous fragment seen arising at the lateral aspect of the lateral cuneiform. 5. No significant widening between the bases of the first and second metatarsals, suggesting against a significant Lisfranc injury. 6. Mild dorsal soft tissue swelling noted about the midfoot. 7. Os naviculare noted.   Electronically Signed   By: Garald Balding M.D.   On: 01/28/2015 19:53    Assessment/Plan:     Active Problems:   Rhabdomyolysis  Continue a nonweightbearing status on the left lower extremity. Weightbearing on the right foot allowed only  for transfers. Continue cam walker boots. Patient benefit from PT evaluation to determine if he requires skilled nursing care following discharge. Patient will follow up with Dr. Elvina Mattes. I will sign off for now.    Thornton Park , MD 01/30/2015, 11:28  AM

## 2015-01-30 NOTE — Progress Notes (Signed)
Daily Progress Note   Subjective  - * No surgery found *  Follow-up bilateral foot fractures. Physical therapy evaluated today. Skilled facility was recommended. States he still having some pain.  Objective Filed Vitals:   01/30/15 0433 01/30/15 0437 01/30/15 0738 01/30/15 1518  BP:  94/57 107/76 106/66  Pulse: 92 88 90 85  Temp: 98.6 F (37 C)  98.2 F (36.8 C) 97.8 F (36.6 C)  TempSrc: Oral  Oral Oral  Resp: 17  18 18   Height:      Weight:      SpO2: 97%  97% 95%    Physical Exam: His feet were unwrapped and boots were off. He was laying in bed with feet elevated. He has a slight amount of edema to bilateral feet. Slightly worse than the right. No skin breaks or fracture blisters at this time. Skin is warm to touch. Not severely painful with light touch.  Laboratory CBC    Component Value Date/Time   WBC 15.6* 01/27/2015 1118   HGB 13.4 01/27/2015 1118   HCT 40.1 01/27/2015 1118   PLT 196 01/27/2015 1118    BMET    Component Value Date/Time   NA 139 01/28/2015 0344   K 5.2* 01/28/2015 0344   CL 108 01/28/2015 0344   CO2 21* 01/28/2015 0344   GLUCOSE 89 01/28/2015 0344   BUN 32* 01/28/2015 0344   CREATININE 1.52* 01/28/2015 0344   CALCIUM 8.4* 01/28/2015 0344   GFRNONAA 50* 01/28/2015 0344   GFRAA 58* 01/28/2015 0344    Assessment/Planning: He is being sent to a skilled facility possibly tomorrow. He is to continue with nonweightbearing on his left side with partial weightbearing to his heel for transfer. Continued use of walker or crutches as recommended by physical therapy. Can follow-up with Dr.Troxler in 1-2 weeks upon discharge.       Samara Deist A  01/30/2015, 4:59 PM

## 2015-01-30 NOTE — Evaluation (Signed)
Physical Therapy Evaluation Patient Details Name: Samuel Burke MRN: [id number] DOB: 1960-08-20 Today's Date: 01/30/2015   History of Present Illness  Samuel Burke is a 54 y.o. male, the patient states he fell out of bed on July 1 and was unable to stand or walk. He was taken to ED; evaluation shows LLE lisfranc fracture and left fourth metatarsal fracture; RLE 2nd, 3rd, and 4th metatarsal fracture; MRI shows compression fracture to L4; He was given a lumbar corset to wear when out of bed; Also prescribed CAM boots that must be worn when out of bed; Patient is NWB on LLE and PWB on RLE for sit<>stand transfers and stand pivot transfers only. He exhibits bruising throughout body and weakness in BUE and LE;   Clinical Impression  54 yo Male with recent fall with subsequent B ankle fractures and L4 compression fracture. Patient is NWB on LLE and PWB on RLE with CAM boots and is limited to transfers, stand pivot transfers with a few steps as needed. Patient was independent with SPC prior to admittance. Currently he is supervision for bed mobility, max A for sit<>stand transfer and stand pivot transfer with RW. He exhibits increased weakness in BUE which limits ability to push self up into standing position. Patient was able to maintain good LLE NWB. He would benefit from additional skilled PT intervention to improve functional mobility and balance. PT recommends SNF upon discharge for additional inpatient rehab as patient has limited caregiver support at home and will need additional care. Patient agreeable.    Follow Up Recommendations SNF    Equipment Recommendations  Rolling walker with 5" wheels    Recommendations for Other Services       Precautions / Restrictions Precautions Precautions: Fall Restrictions Weight Bearing Restrictions: Yes RLE Weight Bearing: Partial weight bearing RLE Partial Weight Bearing Percentage or Pounds: needs CAM boot when out of bed;  LLE Weight Bearing: Non  weight bearing Other Position/Activity Restrictions: needs cam boot and lumbar corset when out of bed;       Mobility  Bed Mobility Overal bed mobility: Needs Assistance Bed Mobility: Supine to Sit     Supine to sit: Supervision     General bed mobility comments: able to move legs off bed and push self up with elevated head of bed;   Transfers Overall transfer level: Needs assistance Equipment used: Rolling walker (2 wheeled) Transfers: Sit to/from [facility] Sit to Stand: Max assist Stand pivot transfers: Max assist       General transfer comment: Patient is max A for sit<>stand transfer being able to maintain LLE NWB well, requiring max VCs for increased UE weight bearing to reduce pressure through RLE; Patient is max A for pivot to bedside chair having difficulty shifting/pivoting on RLE with less weight bearing; throughout transfer able to maintain LLE NWB well;   Ambulation/Gait             General Gait Details: unable to ambulate at this time due to weight bearing restrictions and weakness;   Stairs            Wheelchair Mobility    Modified Rankin (Stroke Patients Only)       Balance Overall balance assessment: Needs assistance Sitting-balance support: Bilateral upper extremity supported Sitting balance-Leahy Scale: Good Sitting balance - Comments: able to sit back unsupported with good balance;    Standing balance support: Bilateral upper extremity supported Standing balance-Leahy Scale: Poor Standing balance comment: requires max A for standing balance with  RW                             Pertinent Vitals/Pain Pain Assessment: 0-10 Pain Score: 8  Pain Location: B ankles/back Pain Descriptors / Indicators: Sore;Sharp Pain Intervention(s): Repositioned    Home Living Family/patient expects to be discharged to:: Private residence Living Arrangements: Other relatives (mom and brother;) Available Help at Discharge:  Family (brother can help some, but he's not available 24/7; mom not able to assist) Type of Home: House       Home Layout: One level Home Equipment: Cane - single point      Prior Function Level of Independence: Independent with assistive device(s)         Comments: used SPC for most mobility at home;      Hand Dominance        Extremity/Trunk Assessment   Upper Extremity Assessment: Generalized weakness (grossly 4-/5)           Lower Extremity Assessment: RLE deficits/detail;LLE deficits/detail RLE Deficits / Details: Hip/knee grossly 4/5; ankle not tested due to fractures LLE Deficits / Details: hip/knee grossly 4/5, ankle not tested due to fractures;  Cervical / Trunk Assessment: Kyphotic  Communication   Communication: No difficulties  Cognition Arousal/Alertness: Awake/alert Behavior During Therapy: WFL for tasks assessed/performed Overall Cognitive Status: Within Functional Limits for tasks assessed                      General Comments General comments (skin integrity, edema, etc.): demonstrates bruises and cuts throughout body from recent fall;     Exercises        Assessment/Plan    PT Assessment Patient needs continued PT services  PT Diagnosis Difficulty walking;Generalized weakness;Acute pain   PT Problem List Decreased strength;Pain;Decreased safety awareness;Decreased activity tolerance;Decreased balance;Decreased mobility  PT Treatment Interventions Patient/family education;Wheelchair mobility training;Functional mobility training;Therapeutic activities;Therapeutic exercise;Balance training   PT Goals (Current goals can be found in the Care Plan section) Acute Rehab PT Goals Patient Stated Goal: "I need to be stronger so that I can get up by myself" PT Goal Formulation: With patient Time For Goal Achievement: 02/13/15 Potential to Achieve Goals: Good    Frequency 7X/week   Barriers to discharge Decreased caregiver support pt  lives with family but brother is not always available and his mom is recovering from recent pacemaker surgery;     Co-evaluation               End of Session Equipment Utilized During Treatment: Gait belt Activity Tolerance: Patient limited by pain Patient left: in chair;with call bell/phone within reach;with chair alarm set Nurse Communication: Mobility status         Time: 3300-7622 PT Time Calculation (min) (ACUTE ONLY): 25 min   Charges:   PT Evaluation $Initial PT Evaluation Tier I: 1 Procedure     PT G Codes:        Hopkins,Keron Koffman 01/30/2015, 10:11 AM

## 2015-01-30 NOTE — Care Management Note (Addendum)
Case Management Note  Patient Details  Name: Samuel Burke MRN: [id number] Date of Birth: October 19, 1960  Subjective/Objective:  Presents from home where he lives with his brother and elderly mother. Fall at home with multiple nonsurgical fractures to bilateral metatarsals. Admitted with Rhabdomyolysis. CK improving.  Bilateral CAM boots in place. At baseline patient walks with a cane.  Met with patient. Discussed PT recommendations. Patient in interested in STR. CSW are and to follow up. Patient defers his care to his sister, [name].                Action/Plan: STR  Expected Discharge Date:                  Expected Discharge Plan:  Skilled Nursing Facility  In-House Referral:  Clinical Social Work  Discharge planning Services     Post Acute Care Choice:    Choice offered to:     DME Arranged:    DME Agency:     HH Arranged:    [place] Agency:     Status of Service:     Medicare Important Message Given:    Date Medicare IM Given:    Medicare IM give by:    Date Additional Medicare IM Given:    Additional Medicare Important Message give by:     If discussed at [place] of Stay Meetings, dates discussed:    Additional Comments:   Jolly Mango, RN 01/30/2015, 10:28 AM

## 2015-01-30 NOTE — Progress Notes (Signed)
Clinical Education officer, museum (CSW) presented bed offers. Patient chose [facility]. CSW left voicemail with [facility] admissions coordinator at [place] making her aware of above. CSW will continue to follow and assist as needed.   Blima Rich, [place] [phone]

## 2015-01-30 NOTE — Progress Notes (Signed)
[place] [facility] [place] NAME: Samuel Burke    MR#:  [id number]  DATE OF BIRTH:  01-23-1961  SUBJECTIVE:  Came in from home after had an accidental fall.  Denies any other complaints REVIEW OF SYSTEMS:   Review of Systems  Constitutional: Negative for fever, chills and weight loss.  HENT: Negative for ear discharge, ear pain and nosebleeds.   Eyes: Negative for blurred vision, pain and discharge.  Respiratory: Negative for sputum production, shortness of breath, wheezing and stridor.   Cardiovascular: Negative for chest pain, palpitations, orthopnea and PND.  Gastrointestinal: Negative for nausea, vomiting, abdominal pain and diarrhea.  Genitourinary: Negative for urgency and frequency.  Musculoskeletal: Positive for back pain, joint pain and falls.  Neurological: Positive for weakness. Negative for sensory change, speech change and focal weakness.  Psychiatric/Behavioral: Negative for depression. The patient is not nervous/anxious.   All other systems reviewed and are negative.  Tolerating Diet:yes   DRUG ALLERGIES:   Allergies  Allergen Reactions  . Bee Venom Swelling  . Poison Ivy Extract [Extract Of Poison Ivy] Swelling    VITALS:  Blood pressure 107/76, pulse 90, temperature 98.2 F (36.8 C), temperature source Oral, resp. rate 18, height 5\' 6"  (1.676 m), weight 78.971 kg (174 lb 1.6 oz), SpO2 97 %.  PHYSICAL EXAMINATION:   Physical Exam  GENERAL:  54 y.o.-year-old patient lying in the bed with no acute distress.  EYES: Pupils equal, round, reactive to light and accommodation. No scleral icterus. Extraocular muscles intact.  HEENT: Head atraumatic, normocephalic. Oropharynx and nasopharynx clear. Brusie+ over the forehaed NECK:  Supple, no jugular venous distention. No thyroid enlargement, no tenderness.  LUNGS: Normal breath sounds bilaterally, no wheezing, rales, rhonchi. No use of accessory muscles of respiration.   CARDIOVASCULAR: S1, S2 normal. No murmurs, rubs, or gallops.  ABDOMEN: Soft, nontender, nondistended. Bowel sounds present. No organomegaly or mass.  EXTREMITIES: No cyanosis, clubbing or edema b/l.    NEUROLOGIC: Cranial nerves II through XII are intact. No focal Motor or sensory deficits b/l.   PSYCHIATRIC: The patient is alert and oriented x 3.  SKIN: bruises + in feet, laceration over the right elbow   LABORATORY PANEL:   CBC  Recent Labs Lab 01/27/15 1118  WBC 15.6*  HGB 13.4  HCT 40.1  PLT 196    Chemistries   Recent Labs Lab 01/28/15 0344  NA 139  K 5.2*  CL 108  CO2 21*  GLUCOSE 89  BUN 32*  CREATININE 1.52*  CALCIUM 8.4*  AST 73*  ALT 36  ALKPHOS 52  BILITOT 1.3*    Cardiac Enzymes No results for input(s): TROPONINI in the last 168 hours.  RADIOLOGY:  Ct Foot Left Wo Contrast  01/28/2015   CLINICAL DATA:  Multiple falls. Assess known left foot fracture. Initial encounter.  EXAM: CT OF THE LEFT FOOT WITHOUT CONTRAST  TECHNIQUE: Multidetector CT imaging of the left foot was performed according to the standard protocol. Multiplanar CT image reconstructions were also generated.  COMPARISON:  Left foot radiographs performed 01/27/2015  FINDINGS: There are minimally displaced fractures involving the bases of the second, third and fourth metatarsals, with mild comminution noted at all three fractures, particularly at the second metatarsal fracture. There is also minimal fragmentation involving the lateral aspect of the medial cuneiform. A tiny osseous fragment is seen arising at the medial aspect of the base of the first metatarsal, which may reflect a small avulsion injury, with mild  lateral subluxation of the first metatarsal. A small osseous fragment is noted arising at the lateral aspect of the lateral cuneiform.  Fractures extend to the tarsometatarsal articulations at the first, second, third and fourth tarsometatarsal joints.  No significant widening is noted  between the bases of the first and second metatarsals, suggesting against a significant Lisfranc injury. No additional fractures are seen.  Mild dorsal soft tissue swelling is noted about the midfoot. Visualized flexor and extensor tendons appear grossly intact. The peroneal tendons are grossly unremarkable in appearance. An os naviculare is noted.  IMPRESSION: 1. Minimally displaced fractures involving the bases of the second, third and fourth metatarsals, with mild comminution at all three fractures, particularly at the second metatarsal fracture. Associated intra-articular extension noted. 2. Minimal fragmentation involving the lateral aspect of the medial cuneiform. 3. Tiny osseous fragment arising at the medial aspect of the base of the first metatarsal may reflect a small avulsion injury, with mild lateral subluxation of the first metatarsal. 4. Small osseous fragment seen arising at the lateral aspect of the lateral cuneiform. 5. No significant widening between the bases of the first and second metatarsals, suggesting against a significant Lisfranc injury. 6. Mild dorsal soft tissue swelling noted about the midfoot. 7. Os naviculare noted.   Electronically Signed   By: Garald Balding M.D.   On: 01/28/2015 19:53     ASSESSMENT AND PLAN:    54 year old white male with h/o spinal stenosis on chronci high dose po morphinewho presents with multiple falls is noted to have rhabdomyolysis and a foot fracture   1.Acute habdomyolysis s/p fall -pt recieved aggressive IV fluids. -CPK trending down 7K-->3K-->1000 -encourage po intake  2.  Bilateral foot injury s/p fall -evaluation by Dr Raliegh Ip and Troxlers appreciated -non surgical fractures -resume PT per Dr troxlers input -SW for rehab planning   3. Acute renal failure: Hold lisinopril  -improving -BMP in am  4. Hypertension we'll hold lisinopril, monitor blood pressure is borderline normal   Case discussed with Care Management/Social  Worker. Management plans discussed with the patient, family and they are in agreement.  CODE STATUS:full  DVT Prophylaxis:lovenox  TOTAL TIME TAKING CARE OF THIS PATIENT:56 minutes.  >50% time spent on counselling and coordination of care  POSSIBLE D/C IN 2-3 DAYS, DEPENDING ON CLINICAL CONDITION.   Abdikadir Fohl M.D on 01/30/2015 at 1:00 PM  Between 7am to 6pm - Pager - [phone]  After 6pm go to [email or web address] - password [email or web address] [facility]  [facility]  Office  [phone]  CC: Primary care physician; Webb Silversmith, NP

## 2015-01-30 NOTE — Clinical Social Work Placement (Signed)
   CLINICAL SOCIAL WORK PLACEMENT  NOTE  Date:  01/30/2015  Patient Details  Name: Samuel Burke MRN: [id number] Date of Birth: 1961/06/28  Clinical Social Work is seeking post-discharge placement for this patient at the [place] level of care (*CSW will initial, date and re-position this form in  chart as items are completed):  Yes   Patient/family provided with [place] Work Department's list of facilities offering this level of care within the geographic area requested by the patient (or if unable, by the patient's family).  Yes   Patient/family informed of their freedom to choose among providers that offer the needed level of care, that participate in Medicare, Medicaid or managed care program needed by the patient, have an available bed and are willing to accept the patient.  Yes   Patient/family informed of [place]'s ownership interest in [facility] and [facility], as well as of the fact that they are under no obligation to receive care at these facilities.  PASRR submitted to EDS on 01/30/15     PASRR number received on 01/30/15     Existing PASRR number confirmed on       FL2 transmitted to all facilities in geographic area requested by pt/family on 01/30/15     FL2 transmitted to all facilities within larger geographic area on       Patient informed that his/her managed care company has contracts with or will negotiate with certain facilities, including the following:            Patient/family informed of bed offers received.  Patient chooses bed at       Physician recommends and patient chooses bed at      Patient to be transferred to   on  .  Patient to be transferred to facility by       Patient family notified on   of transfer.  Name of family member notified:        PHYSICIAN Please sign FL2     Additional Comment:    _______________________________________________ Loralyn Freshwater, LCSW 01/30/2015, 4:25  PM

## 2015-01-31 DIAGNOSIS — M79672 Pain in left foot: Secondary | ICD-10-CM | POA: Diagnosis not present

## 2015-01-31 DIAGNOSIS — I10 Essential (primary) hypertension: Secondary | ICD-10-CM | POA: Diagnosis not present

## 2015-01-31 DIAGNOSIS — T796XXA Traumatic ischemia of muscle, initial encounter: Secondary | ICD-10-CM | POA: Diagnosis not present

## 2015-01-31 DIAGNOSIS — M79671 Pain in right foot: Secondary | ICD-10-CM | POA: Diagnosis not present

## 2015-01-31 DIAGNOSIS — G629 Polyneuropathy, unspecified: Secondary | ICD-10-CM | POA: Diagnosis not present

## 2015-01-31 DIAGNOSIS — S92332D Displaced fracture of third metatarsal bone, left foot, subsequent encounter for fracture with routine healing: Secondary | ICD-10-CM | POA: Diagnosis not present

## 2015-01-31 DIAGNOSIS — R5381 Other malaise: Secondary | ICD-10-CM | POA: Diagnosis not present

## 2015-01-31 DIAGNOSIS — M4850XA Collapsed vertebra, not elsewhere classified, site unspecified, initial encounter for fracture: Secondary | ICD-10-CM | POA: Diagnosis not present

## 2015-01-31 DIAGNOSIS — S025XXS Fracture of tooth (traumatic), sequela: Secondary | ICD-10-CM | POA: Diagnosis not present

## 2015-01-31 DIAGNOSIS — S92909A Unspecified fracture of unspecified foot, initial encounter for closed fracture: Secondary | ICD-10-CM | POA: Diagnosis not present

## 2015-01-31 DIAGNOSIS — M4806 Spinal stenosis, lumbar region: Secondary | ICD-10-CM | POA: Diagnosis not present

## 2015-01-31 DIAGNOSIS — M8088XD Other osteoporosis with current pathological fracture, vertebra(e), subsequent encounter for fracture with routine healing: Secondary | ICD-10-CM | POA: Diagnosis not present

## 2015-01-31 DIAGNOSIS — M62838 Other muscle spasm: Secondary | ICD-10-CM | POA: Diagnosis not present

## 2015-01-31 DIAGNOSIS — S92342D Displaced fracture of fourth metatarsal bone, left foot, subsequent encounter for fracture with routine healing: Secondary | ICD-10-CM | POA: Diagnosis not present

## 2015-01-31 DIAGNOSIS — S92322D Displaced fracture of second metatarsal bone, left foot, subsequent encounter for fracture with routine healing: Secondary | ICD-10-CM | POA: Diagnosis not present

## 2015-01-31 DIAGNOSIS — N179 Acute kidney failure, unspecified: Secondary | ICD-10-CM | POA: Diagnosis not present

## 2015-01-31 DIAGNOSIS — R29898 Other symptoms and signs involving the musculoskeletal system: Secondary | ICD-10-CM | POA: Diagnosis not present

## 2015-01-31 DIAGNOSIS — M6281 Muscle weakness (generalized): Secondary | ICD-10-CM | POA: Diagnosis not present

## 2015-01-31 DIAGNOSIS — S32048D Other fracture of fourth lumbar vertebra, subsequent encounter for fracture with routine healing: Secondary | ICD-10-CM | POA: Diagnosis not present

## 2015-01-31 DIAGNOSIS — Z9181 History of falling: Secondary | ICD-10-CM | POA: Diagnosis not present

## 2015-01-31 DIAGNOSIS — S92909D Unspecified fracture of unspecified foot, subsequent encounter for fracture with routine healing: Secondary | ICD-10-CM | POA: Diagnosis not present

## 2015-01-31 DIAGNOSIS — K5900 Constipation, unspecified: Secondary | ICD-10-CM | POA: Diagnosis not present

## 2015-01-31 DIAGNOSIS — R278 Other lack of coordination: Secondary | ICD-10-CM | POA: Diagnosis not present

## 2015-01-31 LAB — BASIC METABOLIC PANEL
ANION GAP: 5 (ref 5–15)
BUN: 14 mg/dL (ref 6–20)
CALCIUM: 9 mg/dL (ref 8.9–10.3)
CO2: 31 mmol/L (ref 22–32)
CREATININE: 1.15 mg/dL (ref 0.61–1.24)
Chloride: 104 mmol/L (ref 101–111)
GFR calc Af Amer: >60 mL/min (ref >60)
Glucose, Bld: 106 mg/dL — ABNORMAL HIGH (ref 65–99)
Potassium: 4.1 mmol/L (ref 3.5–5.1)
Sodium: 140 mmol/L (ref 135–145)

## 2015-01-31 MED ORDER — LISINOPRIL 5 MG PO TABS: 5.0000 mg | ORAL_TABLET | Freq: Every day | ORAL | Status: DC

## 2015-01-31 MED ORDER — MORPHINE SULFATE ER 60 MG PO TBCR: 60.0000 mg | EXTENDED_RELEASE_TABLET | Freq: Three times a day (TID) | ORAL | Status: AC

## 2015-01-31 NOTE — Progress Notes (Signed)
Report called to [facility], given to Gaetano Net, Civil engineer, contracting of nursing.) PTS VSS, pt wheeled to car, and sister providing transportation to [employer]

## 2015-01-31 NOTE — Care Management (Signed)
Important Message  Patient Details  Name: Samuel Burke MRN: [id number] Date of Birth: 11/18/1960   Medicare Important Message Given:  Yes-second notification given    Juliann Pulse A [place] 01/31/2015, 10:28 AM

## 2015-01-31 NOTE — Discharge Instructions (Signed)
PT at rehab °

## 2015-01-31 NOTE — Progress Notes (Signed)
Patient is medically stable for D/C to [facility] today. Per [facility] admissions coordinator at [facility] patient is going to room 603 (private). RN will call report to RN on [facility]. Patient's sister [name] is providing transport. [name] will pick patient up between 11 and 12. Clinical Education officer, museum (CSW) prepared D/C packet and sent D/C summary and follow up appointments to [facility] via carefinder. Hoyle Sauer from [facility] came to patient's room and completed admissions paper work. Please reconsult if future social work needs arise. CSW signing off.   Blima Rich, [place] [phone]

## 2015-01-31 NOTE — Clinical Social Work Placement (Signed)
   CLINICAL SOCIAL WORK PLACEMENT  NOTE  Date:  01/31/2015  Patient Details  Name: Samuel Burke MRN: [id number] Date of Birth: [birth year]  Clinical Social Work is seeking post-discharge placement for this patient at the [place] level of care (*CSW will initial, date and re-position this form in  chart as items are completed):  Yes   Patient/family provided with [place] Work Department's list of facilities offering this level of care within the geographic area requested by the patient (or if unable, by the patient's family).  Yes   Patient/family informed of their freedom to choose among providers that offer the needed level of care, that participate in Medicare, Medicaid or managed care program needed by the patient, have an available bed and are willing to accept the patient.  Yes   Patient/family informed of [place]'s ownership interest in [facility] and [facility], as well as of the fact that they are under no obligation to receive care at these facilities.  PASRR submitted to EDS on 01/30/15     PASRR number received on 01/30/15     Existing PASRR number confirmed on       FL2 transmitted to all facilities in geographic area requested by pt/family on 01/30/15     FL2 transmitted to all facilities within larger geographic area on       Patient informed that his/her managed care company has contracts with or will negotiate with certain facilities, including the following:        Yes   Patient/family informed of bed offers received.  Patient chooses bed at  [facility] )     Physician recommends and patient chooses bed at      Patient to be transferred to  [facility] ) on 01/31/15.  Patient to be transferred to facility by  (Sister [name] in private vehicle. )     Patient family notified on 01/31/15 of transfer.  Name of family member notified:   (Sister [name] is aware of D/C. )     PHYSICIAN       Additional  Comment:    _______________________________________________ Loralyn Freshwater, LCSW 01/31/2015, 10:48 AM

## 2015-01-31 NOTE — Progress Notes (Signed)
Physical Therapy Treatment Patient Details Name: Samuel Burke MRN: [id number] DOB: 03-Apr-1961 Today's Date: 01/31/2015    History of Present Illness Samuel Burke is a 54 y.o. male, the patient states he fell out of bed on July 1 and was unable to stand or walk. He was taken to ED; evaluation shows LLE lisfranc fracture and left fourth metatarsal fracture; RLE 2nd, 3rd, and 4th metatarsal fracture; MRI shows compression fracture to L4; He was given a lumbar corset to wear when out of bed; Also prescribed CAM boots that must be worn when out of bed; Patient is NWB on LLE and PWB on RLE for sit<>stand transfers and stand pivot transfers only. He exhibits bruising throughout body and weakness in BUE and LE;     PT Comments    Pt notes he is not familiar with exercises he can/should be performing. Pt wished to remain in bed, so that he could safe his strength for transfers later at skilled nursing facility. Supine bed exercises performed as noted. Pt requests written exercises as well. Reviewed exercises in entirety post performance with written instructions. Lengthy discussion regarding pt's limitations/abilities. Pt educated in modification of exercises and progression as well as use of abdominal bracing to protect back. Pt to be discharged today to skilled nursing facility.   Follow Up Recommendations  SNF     Equipment Recommendations  Rolling walker with 5" wheels    Recommendations for Other Services       Precautions / Restrictions Precautions Precautions: Fall Restrictions Weight Bearing Restrictions: Yes RLE Weight Bearing: Partial weight bearing RLE Partial Weight Bearing Percentage or Pounds: needs CAM boot when out of bed;  LLE Weight Bearing: Non weight bearing Other Position/Activity Restrictions: needs cam boot and lumbar corset when out of bed;     Mobility  Bed Mobility               General bed mobility comments: Pt wished to remain in bed, as he is  planning on discharging this morning and wishes to save strength for transfers later  Transfers                    Ambulation/Gait                 Stairs            Wheelchair Mobility    Modified Rankin (Stroke Patients Only)       Balance                                    Cognition Arousal/Alertness: Awake/alert Behavior During Therapy: WFL for tasks assessed/performed Overall Cognitive Status: Within Functional Limits for tasks assessed                      Exercises General Exercises - Lower Extremity Quad Sets: Strengthening;Both;Supine;20 reps Gluteal Sets: Strengthening;Both;20 reps;Supine Short Arc Quad: AAROM;Both;20 reps;Supine Long Arc Quad:  (Reviewed and verbally/visually in packet) Heel Slides: AAROM;Both;20 reps;Supine Hip ABduction/ADduction: AAROM;Both;20 reps;Supine Straight Leg Raises: AAROM;Both;10 reps;Supine Hip Flexion/Marching:  (reviewed verbally and visually in packet)    General Comments        Pertinent Vitals/Pain Pain Assessment: 0-10 Pain Score: 9  (Was 10 before medication) Pain Location: Back/feet; L sore shoulder Pain Intervention(s): Limited activity within patient's tolerance;Premedicated before session    Home Living  Prior Function            PT Goals (current goals can now be found in the care plan section) Progress towards PT goals: Progressing toward goals    Frequency  7X/week    PT Plan Current plan remains appropriate    Co-evaluation             End of Session   Activity Tolerance: Patient tolerated treatment well Patient left: in bed;with call bell/phone within reach;with bed alarm set     Time: 6440-3474 PT Time Calculation (min) (ACUTE ONLY): 34 min  Charges:  $Therapeutic Exercise: 23-37 mins                    G Codes:      Charlaine Dalton 01/31/2015, 10:49 AM

## 2015-01-31 NOTE — Discharge Summary (Signed)
[facility] [place] NAME: Branch Pacitti    MR#:  [id number]  DATE OF BIRTH:  1961/03/28  DATE OF ADMISSION:  01/27/2015 ADMITTING PHYSICIAN: Dustin Flock, MD  DATE OF DISCHARGE: 01/31/15  PRIMARY CARE PHYSICIAN: Webb Silversmith, NP    ADMISSION DIAGNOSIS:  Weakness of both legs [R29.898] Vertebral fracture, osteoporotic, initial encounter [M80.88XA] Foot fracture, left, closed, initial encounter [S92.902A] Traumatic rhabdomyolysis, initial encounter [T79.6XXA]  DISCHARGE DIAGNOSIS:  Bilateral foot injury s/p fall Vertebral compression fracture L4 Chronic narcotic dependence SECONDARY DIAGNOSIS:   Past Medical History  Diagnosis Date  . History of colon polyps   . Frequent headaches   . Hypertension   . Spinal stenosis     HOSPITAL COURSE:   54 year old white male with h/o spinal stenosis on chronci high dose po morphine who presents with multiple falls is noted to have rhabdomyolysis and a foot fracture   1.Acute habdomyolysis s/p fall -pt recieved aggressive IV fluids. -CPK trending down 7K-->3K-->1000 -encourage po intake -creat 1.1  2. Bilateral foot injury s/p fall -evaluation by Dr Raliegh Ip and Troxlers appreciated -non surgical fractures -resume PT per Dr troxlers input -[facility] today  3. Acute renal failure: -improved with IVF -creat 1.1  -resume low dose lisinopril  4. Hypertension we'll hold lisinopril, monitor blood pressure is borderline normal   Overall stable for d/c to rehab  DISCHARGE CONDITIONS:   fair CONSULTS OBTAINED:  Treatment Team:  Dustin Flock, MD [place], MD Maebelle Munroe, MD Albertine Patricia, DPM  DRUG ALLERGIES:   Allergies  Allergen Reactions  . Bee Venom Swelling  . Poison Ivy Extract [Extract Of Poison Ivy] Swelling    DISCHARGE MEDICATIONS:   Current Discharge Medication List    CONTINUE these medications which have CHANGED   Details   lisinopril (PRINIVIL,ZESTRIL) 5 MG tablet Take 1 tablet (5 mg total) by mouth at bedtime. Qty: 30 tablet, Refills: 0    morphine (MS CONTIN) 60 MG 12 hr tablet Take 1 tablet (60 mg total) by mouth 3 (three) times daily. Qty: 90 tablet, Refills: 0      CONTINUE these medications which have NOT CHANGED   Details  baclofen (LIORESAL) 10 MG tablet Take 10 mg by mouth 2 (two) times daily.    !! Cholecalciferol (RA VITAMIN D-3) 2000 UNITS CAPS Take 1 capsule by mouth daily. Pt takes with a 5,000 unit capsule.    !! Cholecalciferol (VITAMIN D3) 5000 UNITS CAPS Take 1 capsule by mouth daily. Pt takes with a 2,000 unit capsule.    diclofenac (VOLTAREN) 50 MG EC tablet Take 50 mg by mouth 2 (two) times daily.    gabapentin (NEURONTIN) 600 MG tablet Take 600 mg by mouth 3 (three) times daily.    LORazepam (ATIVAN) 0.5 MG tablet Take 0.5 mg by mouth at bedtime as needed for sleep.      !! - Potential duplicate medications found. Please discuss with provider.    STOP taking these medications     Oxycodone HCl 10 MG TABS         If you experience worsening of your admission symptoms, develop shortness of breath, life threatening emergency, suicidal or homicidal thoughts you must seek medical attention immediately by calling 911 or calling your MD immediately  if symptoms less severe.  You Must read complete instructions/literature along with all the possible adverse reactions/side effects for all the Medicines you take and that have been prescribed to you. Take  any new Medicines after you have completely understood and accept all the possible adverse reactions/side effects.   Please note  You were cared for by a hospitalist during your hospital stay. If you have any questions about your discharge medications or the care you received while you were in the hospital after you are discharged, you can call the unit and asked to speak with the hospitalist on call if the hospitalist that took care  of you is not available. Once you are discharged, your primary care physician will handle any further medical issues. Please note that NO REFILLS for any discharge medications will be authorized once you are discharged, as it is imperative that you return to your primary care physician (or establish a relationship with a primary care physician if you do not have one) for your aftercare needs so that they can reassess your need for medications and monitor your lab values. Today   SUBJECTIVE  No complaints   VITAL SIGNS:  Blood pressure 118/84, pulse 98, temperature 98.5 F (36.9 C), temperature source Oral, resp. rate 18, height 5\' 6"  (1.676 m), weight 78.971 kg (174 lb 1.6 oz), SpO2 100 %.  I/O:   Intake/Output Summary (Last 24 hours) at 01/31/15 0806 Last data filed at 01/31/15 0516  Gross per 24 hour  Intake   3085 ml  Output   2900 ml  Net    185 ml    PHYSICAL EXAMINATION:  GENERAL:  54 y.o.-year-old patient lying in the bed with no acute distress.  EYES: Pupils equal, round, reactive to light and accommodation. No scleral icterus. Extraocular muscles intact.  HEENT: Head atraumatic, normocephalic. Oropharynx and nasopharynx clear.  NECK:  Supple, no jugular venous distention. No thyroid enlargement, no tenderness.  LUNGS: Normal breath sounds bilaterally, no wheezing, rales,rhonchi or crepitation. No use of accessory muscles of respiration.  CARDIOVASCULAR: S1, S2 normal. No murmurs, rubs, or gallops.  ABDOMEN: Soft, non-tender, non-distended. Bowel sounds present. No organomegaly or mass.  EXTREMITIES: No pedal edema, cyanosis, or clubbing. Bilateral support boots + NEUROLOGIC: Cranial nerves II through XII are intact. Muscle strength 5/5 in all extremities. Sensation intact. Gait not checked.  PSYCHIATRIC: The patient is alert and oriented x 3.  SKIN: bruises + over the feet and elbow  DATA REVIEW:   CBC   Recent Labs Lab 01/27/15 1118  WBC 15.6*  HGB 13.4  HCT 40.1   PLT 196    Chemistries   Recent Labs Lab 01/28/15 0344 01/31/15 0503  NA 139 140  K 5.2* 4.1  CL 108 104  CO2 21* 31  GLUCOSE 89 106*  BUN 32* 14  CREATININE 1.52* 1.15  CALCIUM 8.4* 9.0  AST 73*  --   ALT 36  --   ALKPHOS 52  --   BILITOT 1.3*  --     Microbiology Results   No results found for this or any previous visit (from the past 240 hour(s)).  RADIOLOGY:  No results found.   Management plans discussed with the patient, family and they are in agreement.  CODE STATUS:     Code Status Orders        Start     Ordered   01/27/15 1914  Full code   Continuous     01/27/15 1914      TOTAL TIME TAKING CARE OF THIS PATIENT: 51minutes.    London Nonaka M.D on 01/31/2015 at 8:06 AM  Between 7am to 6pm - Pager - [phone] After 6pm go to  [email or web address] - password [email or web address]  [facility]  Office  [phone]  CC: Primary care physician; Webb Silversmith, NP

## 2015-02-02 ENCOUNTER — Non-Acute Institutional Stay (SKILLED_NURSING_FACILITY): Payer: Medicare Other | Admitting: Internal Medicine

## 2015-02-02 DIAGNOSIS — S92309S Fracture of unspecified metatarsal bone(s), unspecified foot, sequela: Secondary | ICD-10-CM

## 2015-02-02 DIAGNOSIS — S0083XS Contusion of other part of head, sequela: Secondary | ICD-10-CM | POA: Diagnosis not present

## 2015-02-02 DIAGNOSIS — K5900 Constipation, unspecified: Secondary | ICD-10-CM | POA: Diagnosis not present

## 2015-02-02 DIAGNOSIS — S025XXS Fracture of tooth (traumatic), sequela: Secondary | ICD-10-CM | POA: Diagnosis not present

## 2015-02-02 DIAGNOSIS — R29898 Other symptoms and signs involving the musculoskeletal system: Secondary | ICD-10-CM

## 2015-02-02 DIAGNOSIS — I10 Essential (primary) hypertension: Secondary | ICD-10-CM

## 2015-02-02 DIAGNOSIS — M48061 Spinal stenosis, lumbar region without neurogenic claudication: Secondary | ICD-10-CM

## 2015-02-02 DIAGNOSIS — M4806 Spinal stenosis, lumbar region: Secondary | ICD-10-CM

## 2015-02-03 NOTE — Progress Notes (Signed)
Patient ID: Samuel Burke, male   DOB: [birth year], 54 y.o.   MRN: [id number]      Facility: [facility]   PCP: Webb Silversmith, NP  Code Status: full code  Allergies  Allergen Reactions  . Bee Venom Swelling  . Poison Ivy Extract [Extract Of Poison Ivy] Swelling    Chief Complaint  Patient presents with  . New Admit To SNF     HPI:  54 y.o. year old patient is here for short term rehabilitation post hospital admission from 01/27/15-01/31/15 with spinal stenosis with multiple falls resulting in L4 compression fracture and bilateral foot injury with non surgical fractures. Orthopedic was consulted and recommended conservative management. He had acute rhabdomylosis and received iv fluids and his CPK started trending down. He also had acute renal failure that responded well to iv fluids. He is seen in his room today. He has been constipated. He broke a tooth today and has dental appointment on 02/03/15.   Review of Systems:  Constitutional: Negative for fever, chills, diaphoresis.  HENT: Negative for headache, congestion Eyes: Negative for eye pain, blurred vision, double vision and discharge.  Respiratory: Negative for cough, shortness of breath and wheezing.   Cardiovascular: Negative for chest pain, palpitations, leg swelling.  Gastrointestinal: Negative for heartburn, nausea, vomiting, abdominal pain Genitourinary: Negative for dysuria  Musculoskeletal: chronic pain + Skin: Negative for itching, rash.  Neurological: Negative for dizziness, tingling, focal weakness Psychiatric/Behavioral: Negative for depression   Past Medical History  Diagnosis Date  . History of colon polyps   . Frequent headaches   . Hypertension   . Spinal stenosis    Past Surgical History  Procedure Laterality Date  . Cholecystectomy  2015   Social History:   reports that he quit smoking about 3 years ago. He has never used smokeless tobacco. He reports that he does not drink  alcohol or use illicit drugs.  Family History  Problem Relation Age of Onset  . Cancer Mother     breast  . Alcohol abuse Father   . Heart disease Father   . Heart disease Brother   . Hypertension Brother     Medications: Patient's Medications  New Prescriptions   No medications on file  Previous Medications   BACLOFEN (LIORESAL) 10 MG TABLET    Take 10 mg by mouth 2 (two) times daily.   CHOLECALCIFEROL (RA VITAMIN D-3) 2000 UNITS CAPS    Take 1 capsule by mouth daily. Pt takes with a 5,000 unit capsule.   CHOLECALCIFEROL (VITAMIN D3) 5000 UNITS CAPS    Take 1 capsule by mouth daily. Pt takes with a 2,000 unit capsule.   DICLOFENAC (VOLTAREN) 50 MG EC TABLET    Take 50 mg by mouth 2 (two) times daily.   GABAPENTIN (NEURONTIN) 600 MG TABLET    Take 600 mg by mouth 3 (three) times daily.   LISINOPRIL (PRINIVIL,ZESTRIL) 5 MG TABLET    Take 1 tablet (5 mg total) by mouth at bedtime.   LORAZEPAM (ATIVAN) 0.5 MG TABLET    Take 0.5 mg by mouth at bedtime as needed for sleep.    MORPHINE (MS CONTIN) 60 MG 12 HR TABLET    Take 1 tablet (60 mg total) by mouth 3 (three) times daily.  Modified Medications   No medications on file  Discontinued Medications   No medications on file     Physical Exam: Filed Vitals:   02/02/15 1651  BP: 106/83  Pulse: 78  Temp:  56 F (36.1 C)  Resp: 18  SpO2: 99%    General- adult male, well built, in no acute distress Head- normocephalic, atraumatic Throat- moist mucus membrane Eyes- PERRLA, EOMI, no pallor, no icterus, no discharge, normal conjunctiva, normal sclera Neck- no cervical lymphadenopathy Cardiovascular- normal s1,s2, no murmurs, palpable dorsalis pedis and radial pulses, leg edema Respiratory- bilateral clear to auscultation, no wheeze, no rhonchi, no crackles, no use of accessory muscles Abdomen- bowel sounds present, soft, non tender Musculoskeletal- able to move all 4 extremities, NWB on LLE, partial weight bearing to  RLE Neurological- no focal deficit, alert and oriented  Skin- warm and dry, right elbow skin tear, raised bump on his head, resolving brise on face, both leg in Cam boot Psychiatry- normal mood and affect   Labs reviewed: Basic Metabolic Panel:  Recent Labs  01/27/15 1118 01/28/15 0344 01/31/15 0503  NA 137 139 140  K 5.5* 5.2* 4.1  CL 103 108 104  CO2 25 21* 31  GLUCOSE 111* 89 106*  BUN 37* 32* 14  CREATININE 1.97* 1.52* 1.15  CALCIUM 9.6 8.4* 9.0   Liver Function Tests:  Recent Labs  01/28/15 0344  AST 73*  ALT 36  ALKPHOS 52  BILITOT 1.3*  PROT 6.1*  ALBUMIN 3.3*   No results for input(s): LIPASE, AMYLASE in the last 8760 hours. No results for input(s): AMMONIA in the last 8760 hours. CBC:  Recent Labs  01/27/15 1118  WBC 15.6*  NEUTROABS 13.1*  HGB 13.4  HCT 40.1  MCV 91.1  PLT 196   Cardiac Enzymes:  Recent Labs  01/27/15 1118 01/28/15 0344 01/29/15 0335  CKTOTAL 7214* 3693* 1466*   BNP: Invalid input(s): POCBNP CBG: No results for input(s): GLUCAP in the last 8760 hours.  Radiological Exams: Dg Chest 1 View  01/27/2015   CLINICAL DATA:  fall, weakness in bilateral le  EXAM: CHEST  1 VIEW  COMPARISON:  None.  FINDINGS: Low volume chest. Bilateral basilar atelectasis. The cardiopericardial silhouette appears enlarged, out with side accentuated by low lung volumes. There is no pneumothorax. Thoracic vertebral body height appears preserved. Cholecystectomy clips are present in the right upper quadrant.  IMPRESSION: Low volume chest. Enlarged cardiopericardial silhouette, likely secondary to low volumes.   Electronically Signed   By: Dereck Ligas M.D.   On: 01/27/2015 13:35   Dg Thoracic Spine 2 View  01/27/2015   CLINICAL DATA:  Fall.  Lower extremity weakness.  EXAM: THORACIC SPINE - 2-3 VIEWS  COMPARISON:  None.  FINDINGS: There is mild depression of the upper endplate of B93. No other evidence of a fracture. No spondylolisthesis. There are  minor disc degenerative changes reflected by mild loss of disc height and small endplate osteophytes mostly along the mid thoracic spine.  Soft tissues are unremarkable.  IMPRESSION: 1. Mild depression of the upper endplates of J03 which may reflect a fracture. This could be recent or remote. 2. No other evidence of a fracture.   Electronically Signed   By: Lajean Manes M.D.   On: 01/27/2015 13:35   Dg Lumbar Spine Complete  01/27/2015   CLINICAL DATA:  Fall.  Lower extremity weakness.  EXAM: LUMBAR SPINE - COMPLETE 4+ VIEW  COMPARISON:  None.  FINDINGS: No fracture of the lumbar spine. No spondylolisthesis. Mild loss of disc height from the lower thoracic spine through L4-L5. There are small endplate osteophytes.  Soft tissues are unremarkable.  IMPRESSION: No fracture or acute finding.   Electronically Signed  By: Lajean Manes M.D.   On: 01/27/2015 13:36   Dg Pelvis 1-2 Views  01/27/2015   CLINICAL DATA:  Follow-up. Bilateral lower extremity weakness. Initial encounter.  EXAM: PELVIS - 1-2 VIEW  COMPARISON:  None.  FINDINGS: Pelvic rings appear intact. Proximal femur appears normal bilaterally. Sacral arcades normal. Phleboliths in the anatomic pelvis. Hip joint spaces are symmetric.  IMPRESSION: No acute osseous abnormality.   Electronically Signed   By: Dereck Ligas M.D.   On: 01/27/2015 13:37   Ct Head Wo Contrast  01/27/2015   CLINICAL DATA:  Fall last night, bilateral weakness.  EXAM: CT HEAD WITHOUT CONTRAST  CT CERVICAL SPINE WITHOUT CONTRAST  TECHNIQUE: Multidetector CT imaging of the head and cervical spine was performed following the standard protocol without intravenous contrast. Multiplanar CT image reconstructions of the cervical spine were also generated.  COMPARISON:  None.  FINDINGS: CT HEAD FINDINGS  Soft tissue swelling over the right forehead. No acute intracranial abnormality. Specifically, no hemorrhage, hydrocephalus, mass lesion, acute infarction, or significant intracranial  injury. No acute calvarial abnormality. Visualized paranasal sinuses and mastoids clear. Orbital soft tissues unremarkable.  CT CERVICAL SPINE FINDINGS  Mild degenerative disc disease diffusely throughout the cervical spine with disc space narrowing and spurring. Mild degenerative facet disease bilaterally. Normal alignment. Prevertebral soft tissues are normal.  Mild bilateral multi level neural foraminal narrowing due to uncovertebral spurring. No fracture. No epidural or paraspinal hematoma. Visualized lung apices clear.  IMPRESSION: No acute intracranial abnormality.  Mild degenerative disc and facet disease throughout the cervical spine with mild bilateral multi level neural foraminal narrowing. No acute bony abnormality.   Electronically Signed   By: Rolm Baptise M.D.   On: 01/27/2015 12:04   Ct Cervical Spine Wo Contrast  01/27/2015   CLINICAL DATA:  Fall last night, bilateral weakness.  EXAM: CT HEAD WITHOUT CONTRAST  CT CERVICAL SPINE WITHOUT CONTRAST  TECHNIQUE: Multidetector CT imaging of the head and cervical spine was performed following the standard protocol without intravenous contrast. Multiplanar CT image reconstructions of the cervical spine were also generated.  COMPARISON:  None.  FINDINGS: CT HEAD FINDINGS  Soft tissue swelling over the right forehead. No acute intracranial abnormality. Specifically, no hemorrhage, hydrocephalus, mass lesion, acute infarction, or significant intracranial injury. No acute calvarial abnormality. Visualized paranasal sinuses and mastoids clear. Orbital soft tissues unremarkable.  CT CERVICAL SPINE FINDINGS  Mild degenerative disc disease diffusely throughout the cervical spine with disc space narrowing and spurring. Mild degenerative facet disease bilaterally. Normal alignment. Prevertebral soft tissues are normal.  Mild bilateral multi level neural foraminal narrowing due to uncovertebral spurring. No fracture. No epidural or paraspinal hematoma. Visualized lung  apices clear.  IMPRESSION: No acute intracranial abnormality.  Mild degenerative disc and facet disease throughout the cervical spine with mild bilateral multi level neural foraminal narrowing. No acute bony abnormality.   Electronically Signed   By: Rolm Baptise M.D.   On: 01/27/2015 12:04   Mr Thoracic Spine Wo Contrast  01/27/2015   CLINICAL DATA:  Severe low back pain. Multiple falls. Weakness in both legs.  EXAM: MRI THORACIC SPINE WITHOUT CONTRAST  TECHNIQUE: Multiplanar, multisequence MR imaging of the thoracic spine was performed. No intravenous contrast was administered.  COMPARISON:  Radiographs dated 01/27/2015  FINDINGS: There is slight hypertrophy of the ligamentum flavum in the midline at T3-4 and T4-5 with no neural impingement.  Tiny central disc protrusion at T9-10 which touches the ventral aspect of the spinal cord but there  is no myelopathy.  Hypertrophy of the ligamentum flavum to the right and left at T10-11. Old slight deformity of the superior endplate of M01.  Small subacute or old compression fracture of the anterior superior aspect of T12. It is no disc protrusion.  T12-L1 and L1-2 are normal.  The thoracic spinal cord is normal. Paraspinal soft tissues are normal.  IMPRESSION: 1. Tiny subacute or old compression fracture of the anterior superior aspect of T12 with no neural impingement. 2. Old deformity of the superior endplate of U27 with no neural impingement. 3. Small central disc protrusion at T9-10 with no neural impingement.   Electronically Signed   By: Lorriane Shire M.D.   On: 01/27/2015 16:15   Mr Lumbar Spine Wo Contrast  01/27/2015   CLINICAL DATA:  Multiple falls last night. Back pain with increased bilateral lower extremity weakness.  EXAM: MRI LUMBAR SPINE WITHOUT CONTRAST  TECHNIQUE: Multiplanar, multisequence MR imaging of the lumbar spine was performed. No intravenous contrast was administered.  COMPARISON:  Radiographs dated 01/27/2015  FINDINGS: There is an acute  subtle compression fracture of the superior endplate of L4 asymmetric to the left. The fracture does not involve the posterior margin of the vertebral body. There is no bone or disc protrusion into the spinal canal at that level.  Normal conus tip at L1-2.  T12-L1:  Normal.  L1-2:  Tiny broad-based disc bulge with no neural impingement.  L2-3:  Tiny broad-based disc bulge with no neural impingement.  L3-4 disc:  Normal disc.  Slight bilateral facet arthritis.  L4-5: Slight broad-based disc bulge with slight narrowing of the left lateral recess which could affect the left L5 nerve.  L5-S1:  Small broad-based disc bulge with no neural impingement.  IMPRESSION: 1. Acute benign appearing compression fracture of the of the superior endplate of L4 asymmetric to the left. No neural impingement at that level. 2. Left lateral recess compression at L4-5 which could affect the left L5 nerve.   Electronically Signed   By: Lorriane Shire M.D.   On: 01/27/2015 16:35   Ct Foot Left Wo Contrast  01/28/2015   CLINICAL DATA:  Multiple falls. Assess known left foot fracture. Initial encounter.  EXAM: CT OF THE LEFT FOOT WITHOUT CONTRAST  TECHNIQUE: Multidetector CT imaging of the left foot was performed according to the standard protocol. Multiplanar CT image reconstructions were also generated.  COMPARISON:  Left foot radiographs performed 01/27/2015  FINDINGS: There are minimally displaced fractures involving the bases of the second, third and fourth metatarsals, with mild comminution noted at all three fractures, particularly at the second metatarsal fracture. There is also minimal fragmentation involving the lateral aspect of the medial cuneiform. A tiny osseous fragment is seen arising at the medial aspect of the base of the first metatarsal, which may reflect a small avulsion injury, with mild lateral subluxation of the first metatarsal. A small osseous fragment is noted arising at the lateral aspect of the lateral cuneiform.   Fractures extend to the tarsometatarsal articulations at the first, second, third and fourth tarsometatarsal joints.  No significant widening is noted between the bases of the first and second metatarsals, suggesting against a significant Lisfranc injury. No additional fractures are seen.  Mild dorsal soft tissue swelling is noted about the midfoot. Visualized flexor and extensor tendons appear grossly intact. The peroneal tendons are grossly unremarkable in appearance. An os naviculare is noted.  IMPRESSION: 1. Minimally displaced fractures involving the bases of the second, third and fourth  metatarsals, with mild comminution at all three fractures, particularly at the second metatarsal fracture. Associated intra-articular extension noted. 2. Minimal fragmentation involving the lateral aspect of the medial cuneiform. 3. Tiny osseous fragment arising at the medial aspect of the base of the first metatarsal may reflect a small avulsion injury, with mild lateral subluxation of the first metatarsal. 4. Small osseous fragment seen arising at the lateral aspect of the lateral cuneiform. 5. No significant widening between the bases of the first and second metatarsals, suggesting against a significant Lisfranc injury. 6. Mild dorsal soft tissue swelling noted about the midfoot. 7. Os naviculare noted.   Electronically Signed   By: Garald Balding M.D.   On: 01/28/2015 19:53   Dg Foot Complete Left  01/27/2015   CLINICAL DATA:  Pain and swelling secondary to a fall.  EXAM: LEFT FOOT - COMPLETE 3+ VIEW  COMPARISON:  None.  FINDINGS: There fractures of the base of the second and fourth metatarsals. The fracture of the second metatarsal bases at the Lisfranc ligament attachment. There is abnormal widening of the joint space between the medial and middle cuneiforms. This is consistent with a Lisfranc injury. CT scan recommended for further evaluation since I suspect there other fractures there are not visible on these  radiographs.  There small avulsion fractures from the tip of the medial malleolus which I suspect are acute.  IMPRESSION: 1. Lisfranc fracture at the base of the second metatarsal. Fracture of the base of the fourth metatarsal. CT scan recommended for further evaluation of the midfoot. 2. Small probably acute avulsion fractures of the tip of the medial malleolus.   Electronically Signed   By: Lorriane Shire M.D.   On: 01/27/2015 13:39   Dg Foot Complete Right  01/27/2015   CLINICAL DATA:  Fall today. RIGHT foot pain. Initial encounter. Pain with movement.  EXAM: RIGHT FOOT COMPLETE - 3+ VIEW  COMPARISON:  None.  FINDINGS: Transverse fractures through the metatarsal necks of the second, third, and fourth metatarsals are present. The second and third metatarsal neck fractures are mildly displaced, with mild medial angulation. The fourth metatarsal neck fracture is comminuted and moderately displaced. Comminuted fractures extend into the medial margin of the RIGHT fourth metatarsal head but there is no articular surface step-off deformity. Maximal displacement of fourth metatarsal head and neck fragments is 3 mm.  IMPRESSION: Transverse second, third, and comminuted fourth metatarsal neck fractures. Fourth metatarsal neck fracture is comminute.   Electronically Signed   By: Dereck Ligas M.D.   On: 01/27/2015 18:39     Assessment/Plan  Lower extremity weakness Will have patient work with PT/OT as tolerated to regain strength and restore function.  Fall precautions are in place.  Bilateral foot fracture non surgical fracture, NWB to LLE and partial weight bearing to RLE. To work with PT and OT. Continue ms contin 60 mg tid for pain. Continue vitamin d supplement  Lumbar spinal stenosis with radiculopathy Not a surgical candidate. Continue ms contin 60 mg tid, baclofen 10 mg bid, diclofenac 50 mg bid for pain and spasm. Continue neurontin 600 mg tid.  Constipation Add colace 100 mg bid and miralax  daily prn for now and reassess  HTN Stable bp, continue lisinopril 5 mg daily and monitor bp  Broken tooth To see his dentist on 02/03/15, continue current pain regimen  Forehead bump Post trauma. To provide ice pack to help with compression. Continue current pain regimen  Goals of care: short term rehabilitation  Labs/tests ordered: cbc with diff, cmp  Family/ staff Communication: reviewed care plan with patient and nursing supervisor    Blanchie Serve, MD  [facility] [phone] (Monday-Friday 8 am - 5 pm) [phone] (afterhours)

## 2015-02-14 ENCOUNTER — Ambulatory Visit: Payer: Medicare Other | Admitting: Internal Medicine

## 2015-02-15 ENCOUNTER — Non-Acute Institutional Stay (SKILLED_NURSING_FACILITY): Payer: Medicare Other | Admitting: Nurse Practitioner

## 2015-02-15 DIAGNOSIS — M8088XD Other osteoporosis with current pathological fracture, vertebra(e), subsequent encounter for fracture with routine healing: Secondary | ICD-10-CM

## 2015-02-15 DIAGNOSIS — M8008XD Age-related osteoporosis with current pathological fracture, vertebra(e), subsequent encounter for fracture with routine healing: Secondary | ICD-10-CM

## 2015-02-15 DIAGNOSIS — I10 Essential (primary) hypertension: Secondary | ICD-10-CM | POA: Diagnosis not present

## 2015-02-15 DIAGNOSIS — M4806 Spinal stenosis, lumbar region: Secondary | ICD-10-CM | POA: Diagnosis not present

## 2015-02-15 DIAGNOSIS — S92909D Unspecified fracture of unspecified foot, subsequent encounter for fracture with routine healing: Secondary | ICD-10-CM | POA: Diagnosis not present

## 2015-02-15 DIAGNOSIS — R5381 Other malaise: Secondary | ICD-10-CM

## 2015-02-15 DIAGNOSIS — M48061 Spinal stenosis, lumbar region without neurogenic claudication: Secondary | ICD-10-CM

## 2015-02-15 NOTE — Progress Notes (Signed)
Patient ID: Samuel Burke, male   DOB: 1961-03-12, 54 y.o.   MRN: [id number]    Nursing Home Location:  [place] of Service: SNF (31)  PCP: Samuel Silversmith, NP  Allergies  Allergen Reactions  . Bee Venom Swelling  . Poison Ivy Extract [Extract Of Poison Ivy] Swelling    Chief Complaint  Patient presents with  . Discharge Note    HPI:  Patient is a 54 y.o. male seen today at [facility] for discharge home. Pt with a pmh of spinal stenosis on chronic pain medication, htn. Pt is at [facility] place after hospitalization due to fall which resulted in rhabdomyolysis and a foot fracture. Pt treated with aggressive IV fluids ad rhabdo improved. Bilateral foot fractures evaluated by ortho and were noted to be non surgical  fractures. Pts lisinopril frequently being held due to low blood pressure since he has been in rehab. Patient currently doing well with therapy, now stable to discharge home with home health. Pt also being followed by pain management for pain control which has been adequate during rehab.   Review of Systems:  Review of Systems  Constitutional: Negative for activity change, appetite change, fatigue and unexpected weight change.  HENT: Negative for congestion and hearing loss.   Eyes: Negative.   Respiratory: Negative for cough and shortness of breath.   Cardiovascular: Negative for chest pain, palpitations and leg swelling.  Gastrointestinal: Negative for abdominal pain, diarrhea and constipation.  Genitourinary: Negative for dysuria and difficulty urinating.  Musculoskeletal:       Chronic back pain managed by pain clinic, pain controlled.   Skin: Negative for color change and wound.  Neurological: Negative for dizziness and weakness.  Psychiatric/Behavioral: Negative for behavioral problems, confusion and agitation.    Past Medical History  Diagnosis Date  . History of colon polyps   . Frequent headaches   . Hypertension    . Spinal stenosis    Past Surgical History  Procedure Laterality Date  . Cholecystectomy  2015   Social History:   reports that he quit smoking about 3 years ago. He has never used smokeless tobacco. He reports that he does not drink alcohol or use illicit drugs.  Family History  Problem Relation Age of Onset  . Cancer Mother     breast  . Alcohol abuse Father   . Heart disease Father   . Heart disease Brother   . Hypertension Brother     Medications: Patient's Medications  New Prescriptions   No medications on file  Previous Medications   BACLOFEN (LIORESAL) 10 MG TABLET    Take 10 mg by mouth 2 (two) times daily.   CHOLECALCIFEROL (RA VITAMIN D-3) 2000 UNITS CAPS    Take 1 capsule by mouth daily. Pt takes with a 5,000 unit capsule.   CHOLECALCIFEROL (VITAMIN D3) 5000 UNITS CAPS    Take 1 capsule by mouth daily. Pt takes with a 2,000 unit capsule.   DICLOFENAC (VOLTAREN) 50 MG EC TABLET    Take 50 mg by mouth 2 (two) times daily.   GABAPENTIN (NEURONTIN) 600 MG TABLET    Take 600 mg by mouth 3 (three) times daily.   LISINOPRIL (PRINIVIL,ZESTRIL) 5 MG TABLET    Take 1 tablet (5 mg total) by mouth at bedtime.   LORAZEPAM (ATIVAN) 0.5 MG TABLET    Take 0.5 mg by mouth at bedtime as needed for sleep.    MORPHINE (MS CONTIN) 60  MG 12 HR TABLET    Take 1 tablet (60 mg total) by mouth 3 (three) times daily.  Modified Medications   No medications on file  Discontinued Medications   No medications on file     Physical Exam: Filed Vitals:   02/15/15 1515  BP: 108/78  Pulse: 72  Temp: 98.1 F (36.7 C)  Resp: 20    Physical Exam  Constitutional: He is oriented to person, place, and time. No distress.  HENT:  Head: Normocephalic and atraumatic.  Neck: Normal range of motion. Neck supple.  Cardiovascular: Normal rate, regular rhythm and normal heart sounds.   Pulmonary/Chest: Effort normal and breath sounds normal.  Abdominal: Soft. Bowel sounds are normal.    Musculoskeletal: He exhibits no edema or tenderness.  Pt with bilateral fracture boots to feet  Neurological: He is alert and oriented to person, place, and time.  Skin: Skin is warm and dry. He is not diaphoretic.  Psychiatric: He has a normal mood and affect.    Labs reviewed: Basic Metabolic Panel:  Recent Labs  01/27/15 1118 01/28/15 0344 01/31/15 0503  NA 137 139 140  K 5.5* 5.2* 4.1  CL 103 108 104  CO2 25 21* 31  GLUCOSE 111* 89 106*  BUN 37* 32* 14  CREATININE 1.97* 1.52* 1.15  CALCIUM 9.6 8.4* 9.0   Liver Function Tests:  Recent Labs  01/28/15 0344  AST 73*  ALT 36  ALKPHOS 52  BILITOT 1.3*  PROT 6.1*  ALBUMIN 3.3*   No results for input(s): LIPASE, AMYLASE in the last 8760 hours. No results for input(s): AMMONIA in the last 8760 hours. CBC:  Recent Labs  01/27/15 1118  WBC 15.6*  NEUTROABS 13.1*  HGB 13.4  HCT 40.1  MCV 91.1  PLT 196   TSH:  Recent Labs  01/27/15 1132  TSH 0.475   A1C: No results found for: HGBA1C Lipid Panel: No results for input(s): CHOL, HDL, LDLCALC, TRIG, CHOLHDL, LDLDIRECT in the last 8760 hours.   Assessment/Plan 1. Spinal stenosis of lumbar region Pt with chronic pain prior to admission. Pain medication being prescribed by pain clinic  2. Essential hypertension -low blood pressure during rehab stay, lisinopril being held frequently. Will Discontinue at this time and pt to have PCP follow up   3. Foot fracture, unspecified laterality, with routine healing, subsequent encounter Ongoing follow up by ortho, left LE non weight bearing and right LE with partial weight bearing. Cont with fracture boots.   4. Vertebral fracture, osteoporotic, with routine healing, subsequent encounter Pain controlled on current regimen, conts on current pain regimen   5. Physical deconditioning -has improved since in therapy.  pt is stable for discharge-will need PT/OT per home health. DME needed: WC. Rx not written, pt has  pain contract and other medications he already has at home.  will need to follow up with PCP within 2 weeks.     Samuel Burke. Samuel Burke  [facility] [phone] 8 am - 5 pm) [phone] (after hours)

## 2015-02-17 DIAGNOSIS — M79671 Pain in right foot: Secondary | ICD-10-CM | POA: Diagnosis not present

## 2015-02-17 DIAGNOSIS — M79672 Pain in left foot: Secondary | ICD-10-CM | POA: Diagnosis not present

## 2015-02-20 DIAGNOSIS — S32048D Other fracture of fourth lumbar vertebra, subsequent encounter for fracture with routine healing: Secondary | ICD-10-CM | POA: Diagnosis not present

## 2015-02-20 DIAGNOSIS — G894 Chronic pain syndrome: Secondary | ICD-10-CM | POA: Diagnosis not present

## 2015-02-20 DIAGNOSIS — S92332D Displaced fracture of third metatarsal bone, left foot, subsequent encounter for fracture with routine healing: Secondary | ICD-10-CM | POA: Diagnosis not present

## 2015-02-20 DIAGNOSIS — G8929 Other chronic pain: Secondary | ICD-10-CM | POA: Diagnosis not present

## 2015-02-20 DIAGNOSIS — I129 Hypertensive chronic kidney disease with stage 1 through stage 4 chronic kidney disease, or unspecified chronic kidney disease: Secondary | ICD-10-CM | POA: Diagnosis not present

## 2015-02-20 DIAGNOSIS — N179 Acute kidney failure, unspecified: Secondary | ICD-10-CM | POA: Diagnosis not present

## 2015-02-20 DIAGNOSIS — S92322D Displaced fracture of second metatarsal bone, left foot, subsequent encounter for fracture with routine healing: Secondary | ICD-10-CM | POA: Diagnosis not present

## 2015-02-20 DIAGNOSIS — M542 Cervicalgia: Secondary | ICD-10-CM | POA: Diagnosis not present

## 2015-02-20 DIAGNOSIS — F1120 Opioid dependence, uncomplicated: Secondary | ICD-10-CM | POA: Diagnosis not present

## 2015-02-20 DIAGNOSIS — M5440 Lumbago with sciatica, unspecified side: Secondary | ICD-10-CM | POA: Diagnosis not present

## 2015-02-20 DIAGNOSIS — S92342D Displaced fracture of fourth metatarsal bone, left foot, subsequent encounter for fracture with routine healing: Secondary | ICD-10-CM | POA: Diagnosis not present

## 2015-02-20 DIAGNOSIS — Z79899 Other long term (current) drug therapy: Secondary | ICD-10-CM | POA: Diagnosis not present

## 2015-02-20 DIAGNOSIS — M6281 Muscle weakness (generalized): Secondary | ICD-10-CM | POA: Diagnosis not present

## 2015-02-20 DIAGNOSIS — Z9181 History of falling: Secondary | ICD-10-CM | POA: Diagnosis not present

## 2015-02-21 DIAGNOSIS — S92342D Displaced fracture of fourth metatarsal bone, left foot, subsequent encounter for fracture with routine healing: Secondary | ICD-10-CM | POA: Diagnosis not present

## 2015-02-21 DIAGNOSIS — S92332D Displaced fracture of third metatarsal bone, left foot, subsequent encounter for fracture with routine healing: Secondary | ICD-10-CM | POA: Diagnosis not present

## 2015-02-21 DIAGNOSIS — M6281 Muscle weakness (generalized): Secondary | ICD-10-CM | POA: Diagnosis not present

## 2015-02-21 DIAGNOSIS — I129 Hypertensive chronic kidney disease with stage 1 through stage 4 chronic kidney disease, or unspecified chronic kidney disease: Secondary | ICD-10-CM | POA: Diagnosis not present

## 2015-02-21 DIAGNOSIS — S92322D Displaced fracture of second metatarsal bone, left foot, subsequent encounter for fracture with routine healing: Secondary | ICD-10-CM | POA: Diagnosis not present

## 2015-02-21 DIAGNOSIS — N179 Acute kidney failure, unspecified: Secondary | ICD-10-CM | POA: Diagnosis not present

## 2015-02-21 DIAGNOSIS — S32048D Other fracture of fourth lumbar vertebra, subsequent encounter for fracture with routine healing: Secondary | ICD-10-CM | POA: Diagnosis not present

## 2015-02-21 DIAGNOSIS — G894 Chronic pain syndrome: Secondary | ICD-10-CM | POA: Diagnosis not present

## 2015-02-21 DIAGNOSIS — Z9181 History of falling: Secondary | ICD-10-CM | POA: Diagnosis not present

## 2015-02-22 DIAGNOSIS — N179 Acute kidney failure, unspecified: Secondary | ICD-10-CM | POA: Diagnosis not present

## 2015-02-22 DIAGNOSIS — Z9181 History of falling: Secondary | ICD-10-CM | POA: Diagnosis not present

## 2015-02-22 DIAGNOSIS — S32048D Other fracture of fourth lumbar vertebra, subsequent encounter for fracture with routine healing: Secondary | ICD-10-CM | POA: Diagnosis not present

## 2015-02-22 DIAGNOSIS — I129 Hypertensive chronic kidney disease with stage 1 through stage 4 chronic kidney disease, or unspecified chronic kidney disease: Secondary | ICD-10-CM | POA: Diagnosis not present

## 2015-02-22 DIAGNOSIS — S92342D Displaced fracture of fourth metatarsal bone, left foot, subsequent encounter for fracture with routine healing: Secondary | ICD-10-CM | POA: Diagnosis not present

## 2015-02-22 DIAGNOSIS — M6281 Muscle weakness (generalized): Secondary | ICD-10-CM | POA: Diagnosis not present

## 2015-02-22 DIAGNOSIS — G894 Chronic pain syndrome: Secondary | ICD-10-CM | POA: Diagnosis not present

## 2015-02-22 DIAGNOSIS — S92322D Displaced fracture of second metatarsal bone, left foot, subsequent encounter for fracture with routine healing: Secondary | ICD-10-CM | POA: Diagnosis not present

## 2015-02-22 DIAGNOSIS — S92332D Displaced fracture of third metatarsal bone, left foot, subsequent encounter for fracture with routine healing: Secondary | ICD-10-CM | POA: Diagnosis not present

## 2015-02-24 DIAGNOSIS — S32048D Other fracture of fourth lumbar vertebra, subsequent encounter for fracture with routine healing: Secondary | ICD-10-CM | POA: Diagnosis not present

## 2015-02-24 DIAGNOSIS — M6281 Muscle weakness (generalized): Secondary | ICD-10-CM | POA: Diagnosis not present

## 2015-02-24 DIAGNOSIS — S92332D Displaced fracture of third metatarsal bone, left foot, subsequent encounter for fracture with routine healing: Secondary | ICD-10-CM | POA: Diagnosis not present

## 2015-02-24 DIAGNOSIS — Z9181 History of falling: Secondary | ICD-10-CM | POA: Diagnosis not present

## 2015-02-24 DIAGNOSIS — S92342D Displaced fracture of fourth metatarsal bone, left foot, subsequent encounter for fracture with routine healing: Secondary | ICD-10-CM | POA: Diagnosis not present

## 2015-02-24 DIAGNOSIS — G894 Chronic pain syndrome: Secondary | ICD-10-CM | POA: Diagnosis not present

## 2015-02-24 DIAGNOSIS — S92322D Displaced fracture of second metatarsal bone, left foot, subsequent encounter for fracture with routine healing: Secondary | ICD-10-CM | POA: Diagnosis not present

## 2015-02-24 DIAGNOSIS — N179 Acute kidney failure, unspecified: Secondary | ICD-10-CM | POA: Diagnosis not present

## 2015-02-24 DIAGNOSIS — I129 Hypertensive chronic kidney disease with stage 1 through stage 4 chronic kidney disease, or unspecified chronic kidney disease: Secondary | ICD-10-CM | POA: Diagnosis not present

## 2015-02-27 DIAGNOSIS — S92342D Displaced fracture of fourth metatarsal bone, left foot, subsequent encounter for fracture with routine healing: Secondary | ICD-10-CM | POA: Diagnosis not present

## 2015-02-27 DIAGNOSIS — Z9181 History of falling: Secondary | ICD-10-CM | POA: Diagnosis not present

## 2015-02-27 DIAGNOSIS — S32048D Other fracture of fourth lumbar vertebra, subsequent encounter for fracture with routine healing: Secondary | ICD-10-CM | POA: Diagnosis not present

## 2015-02-27 DIAGNOSIS — N179 Acute kidney failure, unspecified: Secondary | ICD-10-CM | POA: Diagnosis not present

## 2015-02-27 DIAGNOSIS — S92332D Displaced fracture of third metatarsal bone, left foot, subsequent encounter for fracture with routine healing: Secondary | ICD-10-CM | POA: Diagnosis not present

## 2015-02-27 DIAGNOSIS — M6281 Muscle weakness (generalized): Secondary | ICD-10-CM | POA: Diagnosis not present

## 2015-02-27 DIAGNOSIS — S92322D Displaced fracture of second metatarsal bone, left foot, subsequent encounter for fracture with routine healing: Secondary | ICD-10-CM | POA: Diagnosis not present

## 2015-02-27 DIAGNOSIS — G894 Chronic pain syndrome: Secondary | ICD-10-CM | POA: Diagnosis not present

## 2015-02-27 DIAGNOSIS — I129 Hypertensive chronic kidney disease with stage 1 through stage 4 chronic kidney disease, or unspecified chronic kidney disease: Secondary | ICD-10-CM | POA: Diagnosis not present

## 2015-02-28 ENCOUNTER — Encounter: Payer: Self-pay | Admitting: Internal Medicine

## 2015-02-28 ENCOUNTER — Ambulatory Visit (INDEPENDENT_AMBULATORY_CARE_PROVIDER_SITE_OTHER): Payer: Medicare Other | Admitting: Internal Medicine

## 2015-02-28 VITALS — BP 124/72 | HR 94 | Temp 98.1°F

## 2015-02-28 DIAGNOSIS — S92902D Unspecified fracture of left foot, subsequent encounter for fracture with routine healing: Secondary | ICD-10-CM

## 2015-02-28 DIAGNOSIS — S92901D Unspecified fracture of right foot, subsequent encounter for fracture with routine healing: Secondary | ICD-10-CM

## 2015-02-28 DIAGNOSIS — I10 Essential (primary) hypertension: Secondary | ICD-10-CM

## 2015-02-28 DIAGNOSIS — S32000D Wedge compression fracture of unspecified lumbar vertebra, subsequent encounter for fracture with routine healing: Secondary | ICD-10-CM

## 2015-02-28 MED ORDER — LISINOPRIL 5 MG PO TABS: 5.0000 mg | ORAL_TABLET | Freq: Every day | ORAL | Status: DC

## 2015-02-28 NOTE — Progress Notes (Signed)
Subjective:    Patient ID: Samuel Burke, male    DOB: 1961-06-15, 54 y.o.   MRN: [id number]  HPI  Pt presents to the clinic today for hospital follow up. He went to the ER 01/27/15 after experiencing multiple falls secondary to lower extremity weakness. He had a xray of the  thoracic spine which showed:  IMPRESSION: 1. Mild depression of the upper endplates of S01 which may reflect a fracture. This could be recent or remote. 2. No other evidence of a fracture.   Xray of the left foot showed:  IMPRESSION: 1. Lisfranc fracture at the base of the second metatarsal. Fracture of the base of the fourth metatarsal. CT scan recommended for further evaluation of the midfoot. 2. Small probably acute avulsion fractures of the tip of the medial Malleolus.  Xray of the right foot showed:  IMPRESSION: Transverse second, third, and comminuted fourth metatarsal neck fractures. Fourth metatarsal neck fracture is comminute.  He had a normal CT of the head/cervical spine. MRI of the thoracic spine showed:  IMPRESSION: 1. Tiny subacute or old compression fracture of the anterior superior aspect of T12 with no neural impingement. 2. Old deformity of the superior endplate of U93 with no neural Impingement.   MRI of the lumbar spine showed:  IMPRESSION: 1. Acute benign appearing compression fracture of the of the superior endplate of L4 asymmetric to the left. No neural impingement at that level. 2. Left lateral recess compression at L4-5 which could affect the left L5 nerve.   He was in the hospital for 5 days. They put him in boots bilaterally. They also gave him a brace for his back. After discharge from the hospital, he was sent to [facility] for rehab. He spent 17 days there before being discharge home with. He reports he is managing okay. He can only bear weight on his right heel. He gets around in a wheelchair. His pain is being managed by pain management. He has a follow up with  Dr. Elvina Mattes in the next couple of weeks.  He also needs a refill of his Lisinopril today. He denies adverse effects. His BP today is 124/72.   Review of Systems      Past Medical History  Diagnosis Date  . History of colon polyps   . Frequent headaches   . Hypertension   . Spinal stenosis     Current Outpatient Prescriptions  Medication Sig Dispense Refill  . baclofen (LIORESAL) 10 MG tablet Take 10 mg by mouth 2 (two) times daily.    . Cholecalciferol (RA VITAMIN D-3) 2000 UNITS CAPS Take 1 capsule by mouth daily. Pt takes with a 5,000 unit capsule.    . Cholecalciferol (VITAMIN D3) 5000 UNITS CAPS Take 1 capsule by mouth daily. Pt takes with a 2,000 unit capsule.    . diclofenac (VOLTAREN) 50 MG EC tablet Take 50 mg by mouth 2 (two) times daily.    Marland Kitchen gabapentin (NEURONTIN) 600 MG tablet Take 600 mg by mouth 3 (three) times daily.    Marland Kitchen lisinopril (PRINIVIL,ZESTRIL) 5 MG tablet Take 1 tablet (5 mg total) by mouth at bedtime. 30 tablet 0  . LORazepam (ATIVAN) 0.5 MG tablet Take 0.5 mg by mouth at bedtime as needed for sleep.     Marland Kitchen morphine (MS CONTIN) 60 MG 12 hr tablet Take 1 tablet (60 mg total) by mouth 3 (three) times daily. 90 tablet 0   No current facility-administered medications for this visit.    Allergies  Allergen Reactions  . Bee Venom Swelling  . Poison Ivy Extract [Extract Of Poison Ivy] Swelling    Family History  Problem Relation Age of Onset  . Cancer Mother     breast  . Alcohol abuse Father   . Heart disease Father   . Heart disease Brother   . Hypertension Brother     History   Social History  . Marital Status: Widowed    Spouse Name: N/A  . Number of Children: N/A  . Years of Education: N/A   Occupational History  . Not on file.   Social History Main Topics  . Smoking status: Former Smoker    Quit date: 08/15/2011  . Smokeless tobacco: Never Used  . Alcohol Use: No     Comment: sober--since 2002  . Drug Use: No  . Sexual Activity:  Not Currently   Other Topics Concern  . Not on file   Social History Narrative     Constitutional: Denies fever, malaise, fatigue, headache or abrupt weight changes.  Respiratory: Denies difficulty breathing, shortness of breath, cough or sputum production.   Cardiovascular: Denies chest pain, chest tightness, palpitations or swelling in the hands or feet.  Musculoskeletal: Pt reports back pain and pain in bilateral feet.  Skin: Denies redness, rashes, lesions or ulcercations.  Neurological: Pt reports numbness in lower extremities. Denies dizziness, difficulty with memory, difficulty with speech.   No other specific complaints in a complete review of systems (except as listed in HPI above).  Objective:   Physical Exam   BP 124/72 mmHg  Pulse 94  Temp(Src) 98.1 F (36.7 C) (Oral)  Wt   SpO2 95% Wt Readings from Last 3 Encounters:  01/27/15 174 lb 1.6 oz (78.971 kg)  01/27/15 174 lb (78.926 kg)  12/06/14 179 lb (81.194 kg)    General: Appears his stated age, well developed, well nourished in NAD. Skin: Healing bruises noted on bilateral legs and right side of forehead. HEENT: Head: normal shape and size; Eyes: sclera white, no icterus, conjunctiva pink, PERRLA and EOMs intact;  Cardiovascular: Normal rate and rhythm. S1,S2 noted.  No murmur, rubs or gallops noted. Pulmonary/Chest: Normal effort and positive vesicular breath sounds. No respiratory distress. No wheezes, rales or ronchi noted.  Musculoskeletal: Unable to assess at this time. He is wheelchair bound in a back brace and bilateral foot brace. Neurological: Alert and oriented.    BMET    Component Value Date/Time   NA 140 01/31/2015 0503   K 4.1 01/31/2015 0503   CL 104 01/31/2015 0503   CO2 31 01/31/2015 0503   GLUCOSE 106* 01/31/2015 0503   BUN 14 01/31/2015 0503   CREATININE 1.15 01/31/2015 0503   CALCIUM 9.0 01/31/2015 0503   GFRNONAA >60 01/31/2015 0503   GFRAA >60 01/31/2015 0503    Lipid Panel    No results found for: CHOL, TRIG, HDL, CHOLHDL, VLDL, LDLCALC  CBC    Component Value Date/Time   WBC 15.6* 01/27/2015 1118   RBC 4.40 01/27/2015 1118   HGB 13.4 01/27/2015 1118   HCT 40.1 01/27/2015 1118   PLT 196 01/27/2015 1118   MCV 91.1 01/27/2015 1118   MCH 30.5 01/27/2015 1118   MCHC 33.4 01/27/2015 1118   RDW 13.1 01/27/2015 1118   LYMPHSABS 1.4 01/27/2015 1118   MONOABS 1.0 01/27/2015 1118   EOSABS 0.0 01/27/2015 1118   BASOSABS 0.1 01/27/2015 1118    Hgb A1C No results found for: HGBA1C      Assessment &  Plan:   Hospital follow up for lumbar fracture, left and right foot fracture:  Hospital notes, labs and imaging reviewed Pain is being managed by pain management He will continue OT/PT He will follow up with Dr. Elvina Mattes  HTN:  BP well controlled on Lisinopril ECG, CBC and CMET reviewed from hospital Lisinopril refilled today  RTC as needed or if symptoms persist or worsen

## 2015-02-28 NOTE — Progress Notes (Signed)
Pre visit review using our clinic review tool, if applicable. No additional management support is needed unless otherwise documented below in the visit note. 

## 2015-02-28 NOTE — Patient Instructions (Signed)

## 2015-03-01 DIAGNOSIS — S92322D Displaced fracture of second metatarsal bone, left foot, subsequent encounter for fracture with routine healing: Secondary | ICD-10-CM | POA: Diagnosis not present

## 2015-03-01 DIAGNOSIS — G894 Chronic pain syndrome: Secondary | ICD-10-CM | POA: Diagnosis not present

## 2015-03-01 DIAGNOSIS — M6281 Muscle weakness (generalized): Secondary | ICD-10-CM | POA: Diagnosis not present

## 2015-03-01 DIAGNOSIS — I129 Hypertensive chronic kidney disease with stage 1 through stage 4 chronic kidney disease, or unspecified chronic kidney disease: Secondary | ICD-10-CM | POA: Diagnosis not present

## 2015-03-01 DIAGNOSIS — S92342D Displaced fracture of fourth metatarsal bone, left foot, subsequent encounter for fracture with routine healing: Secondary | ICD-10-CM | POA: Diagnosis not present

## 2015-03-01 DIAGNOSIS — S32048D Other fracture of fourth lumbar vertebra, subsequent encounter for fracture with routine healing: Secondary | ICD-10-CM | POA: Diagnosis not present

## 2015-03-01 DIAGNOSIS — N179 Acute kidney failure, unspecified: Secondary | ICD-10-CM | POA: Diagnosis not present

## 2015-03-01 DIAGNOSIS — Z9181 History of falling: Secondary | ICD-10-CM | POA: Diagnosis not present

## 2015-03-01 DIAGNOSIS — S92332D Displaced fracture of third metatarsal bone, left foot, subsequent encounter for fracture with routine healing: Secondary | ICD-10-CM | POA: Diagnosis not present

## 2015-03-06 DIAGNOSIS — Z9181 History of falling: Secondary | ICD-10-CM | POA: Diagnosis not present

## 2015-03-06 DIAGNOSIS — G894 Chronic pain syndrome: Secondary | ICD-10-CM | POA: Diagnosis not present

## 2015-03-06 DIAGNOSIS — N179 Acute kidney failure, unspecified: Secondary | ICD-10-CM | POA: Diagnosis not present

## 2015-03-06 DIAGNOSIS — I129 Hypertensive chronic kidney disease with stage 1 through stage 4 chronic kidney disease, or unspecified chronic kidney disease: Secondary | ICD-10-CM | POA: Diagnosis not present

## 2015-03-06 DIAGNOSIS — M6281 Muscle weakness (generalized): Secondary | ICD-10-CM | POA: Diagnosis not present

## 2015-03-06 DIAGNOSIS — S92322D Displaced fracture of second metatarsal bone, left foot, subsequent encounter for fracture with routine healing: Secondary | ICD-10-CM | POA: Diagnosis not present

## 2015-03-06 DIAGNOSIS — S92342D Displaced fracture of fourth metatarsal bone, left foot, subsequent encounter for fracture with routine healing: Secondary | ICD-10-CM | POA: Diagnosis not present

## 2015-03-06 DIAGNOSIS — S92332D Displaced fracture of third metatarsal bone, left foot, subsequent encounter for fracture with routine healing: Secondary | ICD-10-CM | POA: Diagnosis not present

## 2015-03-06 DIAGNOSIS — S32048D Other fracture of fourth lumbar vertebra, subsequent encounter for fracture with routine healing: Secondary | ICD-10-CM | POA: Diagnosis not present

## 2015-03-08 DIAGNOSIS — M6281 Muscle weakness (generalized): Secondary | ICD-10-CM | POA: Diagnosis not present

## 2015-03-08 DIAGNOSIS — S32048D Other fracture of fourth lumbar vertebra, subsequent encounter for fracture with routine healing: Secondary | ICD-10-CM | POA: Diagnosis not present

## 2015-03-08 DIAGNOSIS — Z9181 History of falling: Secondary | ICD-10-CM | POA: Diagnosis not present

## 2015-03-08 DIAGNOSIS — S92342D Displaced fracture of fourth metatarsal bone, left foot, subsequent encounter for fracture with routine healing: Secondary | ICD-10-CM | POA: Diagnosis not present

## 2015-03-08 DIAGNOSIS — I129 Hypertensive chronic kidney disease with stage 1 through stage 4 chronic kidney disease, or unspecified chronic kidney disease: Secondary | ICD-10-CM | POA: Diagnosis not present

## 2015-03-08 DIAGNOSIS — S92332D Displaced fracture of third metatarsal bone, left foot, subsequent encounter for fracture with routine healing: Secondary | ICD-10-CM | POA: Diagnosis not present

## 2015-03-08 DIAGNOSIS — N179 Acute kidney failure, unspecified: Secondary | ICD-10-CM | POA: Diagnosis not present

## 2015-03-08 DIAGNOSIS — S92322D Displaced fracture of second metatarsal bone, left foot, subsequent encounter for fracture with routine healing: Secondary | ICD-10-CM | POA: Diagnosis not present

## 2015-03-08 DIAGNOSIS — G894 Chronic pain syndrome: Secondary | ICD-10-CM | POA: Diagnosis not present

## 2015-03-13 DIAGNOSIS — N179 Acute kidney failure, unspecified: Secondary | ICD-10-CM | POA: Diagnosis not present

## 2015-03-13 DIAGNOSIS — S32048D Other fracture of fourth lumbar vertebra, subsequent encounter for fracture with routine healing: Secondary | ICD-10-CM | POA: Diagnosis not present

## 2015-03-13 DIAGNOSIS — M6281 Muscle weakness (generalized): Secondary | ICD-10-CM | POA: Diagnosis not present

## 2015-03-13 DIAGNOSIS — S92342D Displaced fracture of fourth metatarsal bone, left foot, subsequent encounter for fracture with routine healing: Secondary | ICD-10-CM | POA: Diagnosis not present

## 2015-03-13 DIAGNOSIS — Z9181 History of falling: Secondary | ICD-10-CM | POA: Diagnosis not present

## 2015-03-13 DIAGNOSIS — G894 Chronic pain syndrome: Secondary | ICD-10-CM | POA: Diagnosis not present

## 2015-03-13 DIAGNOSIS — S92332D Displaced fracture of third metatarsal bone, left foot, subsequent encounter for fracture with routine healing: Secondary | ICD-10-CM | POA: Diagnosis not present

## 2015-03-13 DIAGNOSIS — I129 Hypertensive chronic kidney disease with stage 1 through stage 4 chronic kidney disease, or unspecified chronic kidney disease: Secondary | ICD-10-CM | POA: Diagnosis not present

## 2015-03-13 DIAGNOSIS — S92322D Displaced fracture of second metatarsal bone, left foot, subsequent encounter for fracture with routine healing: Secondary | ICD-10-CM | POA: Diagnosis not present

## 2015-03-17 DIAGNOSIS — S92332D Displaced fracture of third metatarsal bone, left foot, subsequent encounter for fracture with routine healing: Secondary | ICD-10-CM | POA: Diagnosis not present

## 2015-03-17 DIAGNOSIS — M6281 Muscle weakness (generalized): Secondary | ICD-10-CM | POA: Diagnosis not present

## 2015-03-17 DIAGNOSIS — S92342D Displaced fracture of fourth metatarsal bone, left foot, subsequent encounter for fracture with routine healing: Secondary | ICD-10-CM | POA: Diagnosis not present

## 2015-03-17 DIAGNOSIS — I129 Hypertensive chronic kidney disease with stage 1 through stage 4 chronic kidney disease, or unspecified chronic kidney disease: Secondary | ICD-10-CM | POA: Diagnosis not present

## 2015-03-17 DIAGNOSIS — Z9181 History of falling: Secondary | ICD-10-CM | POA: Diagnosis not present

## 2015-03-17 DIAGNOSIS — G894 Chronic pain syndrome: Secondary | ICD-10-CM | POA: Diagnosis not present

## 2015-03-17 DIAGNOSIS — S92322D Displaced fracture of second metatarsal bone, left foot, subsequent encounter for fracture with routine healing: Secondary | ICD-10-CM | POA: Diagnosis not present

## 2015-03-17 DIAGNOSIS — S32048D Other fracture of fourth lumbar vertebra, subsequent encounter for fracture with routine healing: Secondary | ICD-10-CM | POA: Diagnosis not present

## 2015-03-17 DIAGNOSIS — N179 Acute kidney failure, unspecified: Secondary | ICD-10-CM | POA: Diagnosis not present

## 2015-03-19 DIAGNOSIS — S92322D Displaced fracture of second metatarsal bone, left foot, subsequent encounter for fracture with routine healing: Secondary | ICD-10-CM | POA: Diagnosis not present

## 2015-03-20 DIAGNOSIS — M79673 Pain in unspecified foot: Secondary | ICD-10-CM | POA: Diagnosis not present

## 2015-03-20 DIAGNOSIS — S92322D Displaced fracture of second metatarsal bone, left foot, subsequent encounter for fracture with routine healing: Secondary | ICD-10-CM | POA: Diagnosis not present

## 2015-03-20 DIAGNOSIS — S92332D Displaced fracture of third metatarsal bone, left foot, subsequent encounter for fracture with routine healing: Secondary | ICD-10-CM | POA: Diagnosis not present

## 2015-03-20 DIAGNOSIS — S92342D Displaced fracture of fourth metatarsal bone, left foot, subsequent encounter for fracture with routine healing: Secondary | ICD-10-CM | POA: Diagnosis not present

## 2015-03-22 DIAGNOSIS — G894 Chronic pain syndrome: Secondary | ICD-10-CM | POA: Diagnosis not present

## 2015-03-22 DIAGNOSIS — M6281 Muscle weakness (generalized): Secondary | ICD-10-CM | POA: Diagnosis not present

## 2015-03-22 DIAGNOSIS — I129 Hypertensive chronic kidney disease with stage 1 through stage 4 chronic kidney disease, or unspecified chronic kidney disease: Secondary | ICD-10-CM | POA: Diagnosis not present

## 2015-03-22 DIAGNOSIS — Z9181 History of falling: Secondary | ICD-10-CM | POA: Diagnosis not present

## 2015-03-22 DIAGNOSIS — S92332D Displaced fracture of third metatarsal bone, left foot, subsequent encounter for fracture with routine healing: Secondary | ICD-10-CM | POA: Diagnosis not present

## 2015-03-22 DIAGNOSIS — S32048D Other fracture of fourth lumbar vertebra, subsequent encounter for fracture with routine healing: Secondary | ICD-10-CM | POA: Diagnosis not present

## 2015-03-22 DIAGNOSIS — S92322D Displaced fracture of second metatarsal bone, left foot, subsequent encounter for fracture with routine healing: Secondary | ICD-10-CM | POA: Diagnosis not present

## 2015-03-22 DIAGNOSIS — N179 Acute kidney failure, unspecified: Secondary | ICD-10-CM | POA: Diagnosis not present

## 2015-03-22 DIAGNOSIS — S92342D Displaced fracture of fourth metatarsal bone, left foot, subsequent encounter for fracture with routine healing: Secondary | ICD-10-CM | POA: Diagnosis not present

## 2015-03-24 DIAGNOSIS — I129 Hypertensive chronic kidney disease with stage 1 through stage 4 chronic kidney disease, or unspecified chronic kidney disease: Secondary | ICD-10-CM | POA: Diagnosis not present

## 2015-03-24 DIAGNOSIS — S32048D Other fracture of fourth lumbar vertebra, subsequent encounter for fracture with routine healing: Secondary | ICD-10-CM | POA: Diagnosis not present

## 2015-03-24 DIAGNOSIS — N179 Acute kidney failure, unspecified: Secondary | ICD-10-CM | POA: Diagnosis not present

## 2015-03-24 DIAGNOSIS — S92342D Displaced fracture of fourth metatarsal bone, left foot, subsequent encounter for fracture with routine healing: Secondary | ICD-10-CM | POA: Diagnosis not present

## 2015-03-24 DIAGNOSIS — S92322D Displaced fracture of second metatarsal bone, left foot, subsequent encounter for fracture with routine healing: Secondary | ICD-10-CM | POA: Diagnosis not present

## 2015-03-24 DIAGNOSIS — M6281 Muscle weakness (generalized): Secondary | ICD-10-CM | POA: Diagnosis not present

## 2015-03-24 DIAGNOSIS — S92332D Displaced fracture of third metatarsal bone, left foot, subsequent encounter for fracture with routine healing: Secondary | ICD-10-CM | POA: Diagnosis not present

## 2015-03-24 DIAGNOSIS — Z9181 History of falling: Secondary | ICD-10-CM | POA: Diagnosis not present

## 2015-03-24 DIAGNOSIS — G894 Chronic pain syndrome: Secondary | ICD-10-CM | POA: Diagnosis not present

## 2015-03-27 DIAGNOSIS — M5440 Lumbago with sciatica, unspecified side: Secondary | ICD-10-CM | POA: Diagnosis not present

## 2015-03-27 DIAGNOSIS — M542 Cervicalgia: Secondary | ICD-10-CM | POA: Diagnosis not present

## 2015-03-27 DIAGNOSIS — G8929 Other chronic pain: Secondary | ICD-10-CM | POA: Diagnosis not present

## 2015-03-27 DIAGNOSIS — Z79899 Other long term (current) drug therapy: Secondary | ICD-10-CM | POA: Diagnosis not present

## 2015-03-27 DIAGNOSIS — F1120 Opioid dependence, uncomplicated: Secondary | ICD-10-CM | POA: Diagnosis not present

## 2015-03-27 DIAGNOSIS — G894 Chronic pain syndrome: Secondary | ICD-10-CM | POA: Diagnosis not present

## 2015-03-28 DIAGNOSIS — Z9181 History of falling: Secondary | ICD-10-CM | POA: Diagnosis not present

## 2015-03-28 DIAGNOSIS — S92332D Displaced fracture of third metatarsal bone, left foot, subsequent encounter for fracture with routine healing: Secondary | ICD-10-CM | POA: Diagnosis not present

## 2015-03-28 DIAGNOSIS — N179 Acute kidney failure, unspecified: Secondary | ICD-10-CM | POA: Diagnosis not present

## 2015-03-28 DIAGNOSIS — M6281 Muscle weakness (generalized): Secondary | ICD-10-CM | POA: Diagnosis not present

## 2015-03-28 DIAGNOSIS — I129 Hypertensive chronic kidney disease with stage 1 through stage 4 chronic kidney disease, or unspecified chronic kidney disease: Secondary | ICD-10-CM | POA: Diagnosis not present

## 2015-03-28 DIAGNOSIS — S92342D Displaced fracture of fourth metatarsal bone, left foot, subsequent encounter for fracture with routine healing: Secondary | ICD-10-CM | POA: Diagnosis not present

## 2015-03-28 DIAGNOSIS — G894 Chronic pain syndrome: Secondary | ICD-10-CM | POA: Diagnosis not present

## 2015-03-28 DIAGNOSIS — S32048D Other fracture of fourth lumbar vertebra, subsequent encounter for fracture with routine healing: Secondary | ICD-10-CM | POA: Diagnosis not present

## 2015-03-28 DIAGNOSIS — S92322D Displaced fracture of second metatarsal bone, left foot, subsequent encounter for fracture with routine healing: Secondary | ICD-10-CM | POA: Diagnosis not present

## 2015-03-30 DIAGNOSIS — N179 Acute kidney failure, unspecified: Secondary | ICD-10-CM | POA: Diagnosis not present

## 2015-03-30 DIAGNOSIS — S92332D Displaced fracture of third metatarsal bone, left foot, subsequent encounter for fracture with routine healing: Secondary | ICD-10-CM | POA: Diagnosis not present

## 2015-03-30 DIAGNOSIS — S92342D Displaced fracture of fourth metatarsal bone, left foot, subsequent encounter for fracture with routine healing: Secondary | ICD-10-CM | POA: Diagnosis not present

## 2015-03-30 DIAGNOSIS — G894 Chronic pain syndrome: Secondary | ICD-10-CM | POA: Diagnosis not present

## 2015-03-30 DIAGNOSIS — I129 Hypertensive chronic kidney disease with stage 1 through stage 4 chronic kidney disease, or unspecified chronic kidney disease: Secondary | ICD-10-CM | POA: Diagnosis not present

## 2015-03-30 DIAGNOSIS — Z9181 History of falling: Secondary | ICD-10-CM | POA: Diagnosis not present

## 2015-03-30 DIAGNOSIS — S32048D Other fracture of fourth lumbar vertebra, subsequent encounter for fracture with routine healing: Secondary | ICD-10-CM | POA: Diagnosis not present

## 2015-03-30 DIAGNOSIS — M6281 Muscle weakness (generalized): Secondary | ICD-10-CM | POA: Diagnosis not present

## 2015-03-30 DIAGNOSIS — S92322D Displaced fracture of second metatarsal bone, left foot, subsequent encounter for fracture with routine healing: Secondary | ICD-10-CM | POA: Diagnosis not present

## 2015-04-03 DIAGNOSIS — S92342D Displaced fracture of fourth metatarsal bone, left foot, subsequent encounter for fracture with routine healing: Secondary | ICD-10-CM | POA: Diagnosis not present

## 2015-04-03 DIAGNOSIS — M6281 Muscle weakness (generalized): Secondary | ICD-10-CM | POA: Diagnosis not present

## 2015-04-03 DIAGNOSIS — N179 Acute kidney failure, unspecified: Secondary | ICD-10-CM | POA: Diagnosis not present

## 2015-04-03 DIAGNOSIS — S92322D Displaced fracture of second metatarsal bone, left foot, subsequent encounter for fracture with routine healing: Secondary | ICD-10-CM | POA: Diagnosis not present

## 2015-04-03 DIAGNOSIS — S32048D Other fracture of fourth lumbar vertebra, subsequent encounter for fracture with routine healing: Secondary | ICD-10-CM | POA: Diagnosis not present

## 2015-04-03 DIAGNOSIS — S92332D Displaced fracture of third metatarsal bone, left foot, subsequent encounter for fracture with routine healing: Secondary | ICD-10-CM | POA: Diagnosis not present

## 2015-04-03 DIAGNOSIS — G894 Chronic pain syndrome: Secondary | ICD-10-CM | POA: Diagnosis not present

## 2015-04-03 DIAGNOSIS — Z9181 History of falling: Secondary | ICD-10-CM | POA: Diagnosis not present

## 2015-04-03 DIAGNOSIS — I129 Hypertensive chronic kidney disease with stage 1 through stage 4 chronic kidney disease, or unspecified chronic kidney disease: Secondary | ICD-10-CM | POA: Diagnosis not present

## 2015-04-05 DIAGNOSIS — I129 Hypertensive chronic kidney disease with stage 1 through stage 4 chronic kidney disease, or unspecified chronic kidney disease: Secondary | ICD-10-CM | POA: Diagnosis not present

## 2015-04-05 DIAGNOSIS — M6281 Muscle weakness (generalized): Secondary | ICD-10-CM | POA: Diagnosis not present

## 2015-04-05 DIAGNOSIS — S32048D Other fracture of fourth lumbar vertebra, subsequent encounter for fracture with routine healing: Secondary | ICD-10-CM | POA: Diagnosis not present

## 2015-04-05 DIAGNOSIS — N179 Acute kidney failure, unspecified: Secondary | ICD-10-CM | POA: Diagnosis not present

## 2015-04-05 DIAGNOSIS — S92342D Displaced fracture of fourth metatarsal bone, left foot, subsequent encounter for fracture with routine healing: Secondary | ICD-10-CM | POA: Diagnosis not present

## 2015-04-05 DIAGNOSIS — Z9181 History of falling: Secondary | ICD-10-CM | POA: Diagnosis not present

## 2015-04-05 DIAGNOSIS — S92332D Displaced fracture of third metatarsal bone, left foot, subsequent encounter for fracture with routine healing: Secondary | ICD-10-CM | POA: Diagnosis not present

## 2015-04-05 DIAGNOSIS — S92322D Displaced fracture of second metatarsal bone, left foot, subsequent encounter for fracture with routine healing: Secondary | ICD-10-CM | POA: Diagnosis not present

## 2015-04-05 DIAGNOSIS — G894 Chronic pain syndrome: Secondary | ICD-10-CM | POA: Diagnosis not present

## 2015-04-10 DIAGNOSIS — S92301D Fracture of unspecified metatarsal bone(s), right foot, subsequent encounter for fracture with routine healing: Secondary | ICD-10-CM | POA: Diagnosis not present

## 2015-04-10 DIAGNOSIS — M79672 Pain in left foot: Secondary | ICD-10-CM | POA: Diagnosis not present

## 2015-04-10 DIAGNOSIS — M79671 Pain in right foot: Secondary | ICD-10-CM | POA: Diagnosis not present

## 2015-04-10 DIAGNOSIS — S92302D Fracture of unspecified metatarsal bone(s), left foot, subsequent encounter for fracture with routine healing: Secondary | ICD-10-CM | POA: Diagnosis not present

## 2015-04-11 DIAGNOSIS — M6281 Muscle weakness (generalized): Secondary | ICD-10-CM | POA: Diagnosis not present

## 2015-04-11 DIAGNOSIS — S92332D Displaced fracture of third metatarsal bone, left foot, subsequent encounter for fracture with routine healing: Secondary | ICD-10-CM | POA: Diagnosis not present

## 2015-04-11 DIAGNOSIS — Z9181 History of falling: Secondary | ICD-10-CM | POA: Diagnosis not present

## 2015-04-11 DIAGNOSIS — S92342D Displaced fracture of fourth metatarsal bone, left foot, subsequent encounter for fracture with routine healing: Secondary | ICD-10-CM | POA: Diagnosis not present

## 2015-04-11 DIAGNOSIS — G894 Chronic pain syndrome: Secondary | ICD-10-CM | POA: Diagnosis not present

## 2015-04-11 DIAGNOSIS — S92322D Displaced fracture of second metatarsal bone, left foot, subsequent encounter for fracture with routine healing: Secondary | ICD-10-CM | POA: Diagnosis not present

## 2015-04-11 DIAGNOSIS — I129 Hypertensive chronic kidney disease with stage 1 through stage 4 chronic kidney disease, or unspecified chronic kidney disease: Secondary | ICD-10-CM | POA: Diagnosis not present

## 2015-04-11 DIAGNOSIS — S32048D Other fracture of fourth lumbar vertebra, subsequent encounter for fracture with routine healing: Secondary | ICD-10-CM | POA: Diagnosis not present

## 2015-04-11 DIAGNOSIS — N179 Acute kidney failure, unspecified: Secondary | ICD-10-CM | POA: Diagnosis not present

## 2015-04-13 DIAGNOSIS — Z9181 History of falling: Secondary | ICD-10-CM | POA: Diagnosis not present

## 2015-04-13 DIAGNOSIS — S92322D Displaced fracture of second metatarsal bone, left foot, subsequent encounter for fracture with routine healing: Secondary | ICD-10-CM | POA: Diagnosis not present

## 2015-04-13 DIAGNOSIS — S92342D Displaced fracture of fourth metatarsal bone, left foot, subsequent encounter for fracture with routine healing: Secondary | ICD-10-CM | POA: Diagnosis not present

## 2015-04-13 DIAGNOSIS — G894 Chronic pain syndrome: Secondary | ICD-10-CM | POA: Diagnosis not present

## 2015-04-13 DIAGNOSIS — S92332D Displaced fracture of third metatarsal bone, left foot, subsequent encounter for fracture with routine healing: Secondary | ICD-10-CM | POA: Diagnosis not present

## 2015-04-13 DIAGNOSIS — N179 Acute kidney failure, unspecified: Secondary | ICD-10-CM | POA: Diagnosis not present

## 2015-04-13 DIAGNOSIS — I129 Hypertensive chronic kidney disease with stage 1 through stage 4 chronic kidney disease, or unspecified chronic kidney disease: Secondary | ICD-10-CM | POA: Diagnosis not present

## 2015-04-13 DIAGNOSIS — S32048D Other fracture of fourth lumbar vertebra, subsequent encounter for fracture with routine healing: Secondary | ICD-10-CM | POA: Diagnosis not present

## 2015-04-13 DIAGNOSIS — M6281 Muscle weakness (generalized): Secondary | ICD-10-CM | POA: Diagnosis not present

## 2015-04-18 DIAGNOSIS — M6281 Muscle weakness (generalized): Secondary | ICD-10-CM | POA: Diagnosis not present

## 2015-04-18 DIAGNOSIS — N179 Acute kidney failure, unspecified: Secondary | ICD-10-CM | POA: Diagnosis not present

## 2015-04-18 DIAGNOSIS — S32048D Other fracture of fourth lumbar vertebra, subsequent encounter for fracture with routine healing: Secondary | ICD-10-CM | POA: Diagnosis not present

## 2015-04-18 DIAGNOSIS — S92322D Displaced fracture of second metatarsal bone, left foot, subsequent encounter for fracture with routine healing: Secondary | ICD-10-CM | POA: Diagnosis not present

## 2015-04-18 DIAGNOSIS — S92342D Displaced fracture of fourth metatarsal bone, left foot, subsequent encounter for fracture with routine healing: Secondary | ICD-10-CM | POA: Diagnosis not present

## 2015-04-18 DIAGNOSIS — S92332D Displaced fracture of third metatarsal bone, left foot, subsequent encounter for fracture with routine healing: Secondary | ICD-10-CM | POA: Diagnosis not present

## 2015-04-18 DIAGNOSIS — G894 Chronic pain syndrome: Secondary | ICD-10-CM | POA: Diagnosis not present

## 2015-04-18 DIAGNOSIS — Z9181 History of falling: Secondary | ICD-10-CM | POA: Diagnosis not present

## 2015-04-18 DIAGNOSIS — I129 Hypertensive chronic kidney disease with stage 1 through stage 4 chronic kidney disease, or unspecified chronic kidney disease: Secondary | ICD-10-CM | POA: Diagnosis not present

## 2015-04-19 DIAGNOSIS — S92332D Displaced fracture of third metatarsal bone, left foot, subsequent encounter for fracture with routine healing: Secondary | ICD-10-CM | POA: Diagnosis not present

## 2015-04-19 DIAGNOSIS — M6281 Muscle weakness (generalized): Secondary | ICD-10-CM | POA: Diagnosis not present

## 2015-04-19 DIAGNOSIS — Z9181 History of falling: Secondary | ICD-10-CM | POA: Diagnosis not present

## 2015-04-19 DIAGNOSIS — S92322D Displaced fracture of second metatarsal bone, left foot, subsequent encounter for fracture with routine healing: Secondary | ICD-10-CM | POA: Diagnosis not present

## 2015-04-19 DIAGNOSIS — I129 Hypertensive chronic kidney disease with stage 1 through stage 4 chronic kidney disease, or unspecified chronic kidney disease: Secondary | ICD-10-CM | POA: Diagnosis not present

## 2015-04-19 DIAGNOSIS — G894 Chronic pain syndrome: Secondary | ICD-10-CM | POA: Diagnosis not present

## 2015-04-19 DIAGNOSIS — S32048D Other fracture of fourth lumbar vertebra, subsequent encounter for fracture with routine healing: Secondary | ICD-10-CM | POA: Diagnosis not present

## 2015-04-19 DIAGNOSIS — S92342D Displaced fracture of fourth metatarsal bone, left foot, subsequent encounter for fracture with routine healing: Secondary | ICD-10-CM | POA: Diagnosis not present

## 2015-04-19 DIAGNOSIS — N179 Acute kidney failure, unspecified: Secondary | ICD-10-CM | POA: Diagnosis not present

## 2015-04-20 ENCOUNTER — Other Ambulatory Visit: Payer: Self-pay | Admitting: Internal Medicine

## 2015-04-20 NOTE — Telephone Encounter (Signed)
This medication has not been filled by you--please advise

## 2015-04-25 DIAGNOSIS — Z87891 Personal history of nicotine dependence: Secondary | ICD-10-CM | POA: Diagnosis not present

## 2015-04-25 DIAGNOSIS — Z9181 History of falling: Secondary | ICD-10-CM | POA: Diagnosis not present

## 2015-04-25 DIAGNOSIS — S93601D Unspecified sprain of right foot, subsequent encounter: Secondary | ICD-10-CM | POA: Diagnosis not present

## 2015-04-25 DIAGNOSIS — S92332D Displaced fracture of third metatarsal bone, left foot, subsequent encounter for fracture with routine healing: Secondary | ICD-10-CM | POA: Diagnosis not present

## 2015-04-25 DIAGNOSIS — M4806 Spinal stenosis, lumbar region: Secondary | ICD-10-CM | POA: Diagnosis not present

## 2015-04-25 DIAGNOSIS — S32048D Other fracture of fourth lumbar vertebra, subsequent encounter for fracture with routine healing: Secondary | ICD-10-CM | POA: Diagnosis not present

## 2015-04-25 DIAGNOSIS — S92322D Displaced fracture of second metatarsal bone, left foot, subsequent encounter for fracture with routine healing: Secondary | ICD-10-CM | POA: Diagnosis not present

## 2015-04-25 DIAGNOSIS — I10 Essential (primary) hypertension: Secondary | ICD-10-CM | POA: Diagnosis not present

## 2015-04-25 DIAGNOSIS — S92342D Displaced fracture of fourth metatarsal bone, left foot, subsequent encounter for fracture with routine healing: Secondary | ICD-10-CM | POA: Diagnosis not present

## 2015-04-27 ENCOUNTER — Ambulatory Visit (INDEPENDENT_AMBULATORY_CARE_PROVIDER_SITE_OTHER): Payer: Medicare Other | Admitting: Internal Medicine

## 2015-04-27 ENCOUNTER — Encounter: Payer: Self-pay | Admitting: Internal Medicine

## 2015-04-27 VITALS — BP 106/70 | HR 72 | Temp 98.1°F | Wt 173.0 lb

## 2015-04-27 DIAGNOSIS — S92342D Displaced fracture of fourth metatarsal bone, left foot, subsequent encounter for fracture with routine healing: Secondary | ICD-10-CM | POA: Diagnosis not present

## 2015-04-27 DIAGNOSIS — Z0000 Encounter for general adult medical examination without abnormal findings: Secondary | ICD-10-CM

## 2015-04-27 DIAGNOSIS — M549 Dorsalgia, unspecified: Secondary | ICD-10-CM

## 2015-04-27 DIAGNOSIS — Z1211 Encounter for screening for malignant neoplasm of colon: Secondary | ICD-10-CM | POA: Diagnosis not present

## 2015-04-27 DIAGNOSIS — I10 Essential (primary) hypertension: Secondary | ICD-10-CM

## 2015-04-27 DIAGNOSIS — Z9181 History of falling: Secondary | ICD-10-CM | POA: Diagnosis not present

## 2015-04-27 DIAGNOSIS — Z125 Encounter for screening for malignant neoplasm of prostate: Secondary | ICD-10-CM

## 2015-04-27 DIAGNOSIS — Z87891 Personal history of nicotine dependence: Secondary | ICD-10-CM | POA: Diagnosis not present

## 2015-04-27 DIAGNOSIS — G8929 Other chronic pain: Secondary | ICD-10-CM

## 2015-04-27 DIAGNOSIS — Z13820 Encounter for screening for osteoporosis: Secondary | ICD-10-CM | POA: Diagnosis not present

## 2015-04-27 DIAGNOSIS — S92332D Displaced fracture of third metatarsal bone, left foot, subsequent encounter for fracture with routine healing: Secondary | ICD-10-CM | POA: Diagnosis not present

## 2015-04-27 DIAGNOSIS — S92322D Displaced fracture of second metatarsal bone, left foot, subsequent encounter for fracture with routine healing: Secondary | ICD-10-CM | POA: Diagnosis not present

## 2015-04-27 DIAGNOSIS — S93601D Unspecified sprain of right foot, subsequent encounter: Secondary | ICD-10-CM | POA: Diagnosis not present

## 2015-04-27 DIAGNOSIS — M4806 Spinal stenosis, lumbar region: Secondary | ICD-10-CM | POA: Diagnosis not present

## 2015-04-27 DIAGNOSIS — S32048D Other fracture of fourth lumbar vertebra, subsequent encounter for fracture with routine healing: Secondary | ICD-10-CM | POA: Diagnosis not present

## 2015-04-27 LAB — PSA, MEDICARE: PSA: 0.47 ng/ml (ref 0.10–4.00)

## 2015-04-27 MED ORDER — DICLOFENAC SODIUM 50 MG PO TBEC: 50.0000 mg | DELAYED_RELEASE_TABLET | Freq: Two times a day (BID) | ORAL | Status: DC

## 2015-04-27 MED ORDER — LISINOPRIL 5 MG PO TABS: 5.0000 mg | ORAL_TABLET | Freq: Every day | ORAL | Status: DC

## 2015-04-27 MED ORDER — BACLOFEN 10 MG PO TABS: 10.0000 mg | ORAL_TABLET | Freq: Two times a day (BID) | ORAL | Status: DC | PRN

## 2015-04-27 NOTE — Progress Notes (Signed)
HPI:  Pt presents to the clinic today for his medicare wellness exam.  Past Medical History  Diagnosis Date  . History of colon polyps   . Frequent headaches   . Hypertension   . Spinal stenosis     Current Outpatient Prescriptions  Medication Sig Dispense Refill  . baclofen (LIORESAL) 10 MG tablet TAKE 1 TABLET BY MOUTH TWICE DAILY AS NEEDED 60 tablet 0  . Cholecalciferol (RA VITAMIN D-3) 2000 UNITS CAPS Take 1 capsule by mouth daily. Pt takes with a 5,000 unit capsule.    . Cholecalciferol (VITAMIN D3) 5000 UNITS CAPS Take 1 capsule by mouth daily. Pt takes with a 2,000 unit capsule.    . diclofenac (VOLTAREN) 50 MG EC tablet Take 50 mg by mouth 2 (two) times daily.    Marland Kitchen gabapentin (NEURONTIN) 600 MG tablet Take 600 mg by mouth 3 (three) times daily.    Marland Kitchen lisinopril (PRINIVIL,ZESTRIL) 5 MG tablet Take 1 tablet (5 mg total) by mouth at bedtime. 90 tablet 1  . LORazepam (ATIVAN) 0.5 MG tablet Take 0.5 mg by mouth at bedtime as needed for sleep.     Marland Kitchen morphine (MS CONTIN) 60 MG 12 hr tablet Take 1 tablet (60 mg total) by mouth 3 (three) times daily. 90 tablet 0   No current facility-administered medications for this visit.    Allergies  Allergen Reactions  . Bee Venom Swelling  . Poison Ivy Extract [Extract Of Poison Ivy] Swelling    Family History  Problem Relation Age of Onset  . Cancer Mother     breast  . Alcohol abuse Father   . Heart disease Father   . Heart disease Brother   . Hypertension Brother     Social History   Social History  . Marital Status: Widowed    Spouse Name: N/A  . Number of Children: N/A  . Years of Education: N/A   Occupational History  . Not on file.   Social History Main Topics  . Smoking status: Former Smoker    Quit date: 08/15/2011  . Smokeless tobacco: Never Used  . Alcohol Use: No     Comment: sober--since 2002  . Drug Use: No  . Sexual Activity: Not Currently   Other Topics Concern  . Not on file   Social History  Narrative    Hospitiliaztions: 01/27/2015- Vertebral Fracture  Health Maintenance:    Flu: 03/2014- wants one today  Tetanus: 01/27/2015  PSA: unsure if he has  Bone Density: never  Colon Screening: 08/2013 89 years)  Eye Doctor: yearly  Dental Exam: yearly   Providers:   PCP: Webb Silversmith, NP-C  Podiatrist: Dr. Elvina Mattes  Pain Specialist: Dr. Humphrey Rolls  Optometrist: he is establishing care  Dentist: [facility]   I have personally reviewed and have noted:  1. The patient's medical and social history 2. Their use of alcohol, tobacco or illicit drugs 3. Their current medications and supplements 4. The patient's functional ability including ADL's, fall risks, home safety  risks and hearing or visual impairment. 5. Diet and physical activities 6. Evidence for depression or mood disorder   Objective:  PE:   BP 106/70 mmHg  Pulse 72  Temp(Src) 98.1 F (36.7 C) (Oral)  Ht   Wt 173 lb (78.472 kg)  SpO2 98%  Wt Readings from Last 3 Encounters:  04/27/15 173 lb (78.472 kg)  01/27/15 174 lb 1.6 oz (78.971 kg)  01/27/15 174 lb (78.926 kg)    General: Appears his stated  age, in NAD. Cardiovascular: Normal rate and rhythm. S1,S2 noted.   Pulmonary/Chest: Normal effort and positive vesicular breath sounds. No respiratory distress. No wheezes, rales or ronchi noted.    BMET    Component Value Date/Time   NA 140 01/31/2015 0503   K 4.1 01/31/2015 0503   CL 104 01/31/2015 0503   CO2 31 01/31/2015 0503   GLUCOSE 106* 01/31/2015 0503   BUN 14 01/31/2015 0503   CREATININE 1.15 01/31/2015 0503   CALCIUM 9.0 01/31/2015 0503   GFRNONAA >60 01/31/2015 0503   GFRAA >60 01/31/2015 0503    Lipid Panel  No results found for: CHOL, TRIG, HDL, CHOLHDL, VLDL, LDLCALC  CBC    Component Value Date/Time   WBC 15.6* 01/27/2015 1118   RBC 4.40 01/27/2015 1118   HGB 13.4 01/27/2015 1118   HCT 40.1 01/27/2015 1118   PLT 196 01/27/2015 1118   MCV 91.1 01/27/2015 1118   MCH 30.5  01/27/2015 1118   MCHC 33.4 01/27/2015 1118   RDW 13.1 01/27/2015 1118   LYMPHSABS 1.4 01/27/2015 1118   MONOABS 1.0 01/27/2015 1118   EOSABS 0.0 01/27/2015 1118   BASOSABS 0.1 01/27/2015 1118    Hgb A1C No results found for: HGBA1C    Assessment and Plan:   Medicare Annual Wellness Visit:  Diet: He is lactose intolerant so he avoids dairy. He also avoids spicy foods. Physical activity: Sedentary Depression/mood screen: Negative Hearing: Intact to whispered voice Visual acuity: Grossly normal, performs annual eye exam  ADLs: Capable Fall risk: High, problems with balance with gait Home safety: Good Cognitive evaluation: Intact to orientation, naming, recall and repetition EOL planning: Adv directives, full code/ I agree  Preventative Medicine: Flu shot today, PSA and bone density exam ordered today   Next appointment: 6 month follow up chronic conditions

## 2015-04-27 NOTE — Progress Notes (Signed)
Pre visit review using our clinic review tool, if applicable. No additional management support is needed unless otherwise documented below in the visit note. 

## 2015-04-27 NOTE — Addendum Note (Signed)
Addended by: Jearld Fenton on: 04/27/2015 03:21 PM   Modules accepted: Miquel Dunn

## 2015-04-27 NOTE — Patient Instructions (Signed)

## 2015-05-01 DIAGNOSIS — M79673 Pain in unspecified foot: Secondary | ICD-10-CM | POA: Diagnosis not present

## 2015-05-02 DIAGNOSIS — Z9181 History of falling: Secondary | ICD-10-CM | POA: Diagnosis not present

## 2015-05-02 DIAGNOSIS — Z87891 Personal history of nicotine dependence: Secondary | ICD-10-CM | POA: Diagnosis not present

## 2015-05-02 DIAGNOSIS — S93601D Unspecified sprain of right foot, subsequent encounter: Secondary | ICD-10-CM | POA: Diagnosis not present

## 2015-05-02 DIAGNOSIS — S92322D Displaced fracture of second metatarsal bone, left foot, subsequent encounter for fracture with routine healing: Secondary | ICD-10-CM | POA: Diagnosis not present

## 2015-05-02 DIAGNOSIS — S32048D Other fracture of fourth lumbar vertebra, subsequent encounter for fracture with routine healing: Secondary | ICD-10-CM | POA: Diagnosis not present

## 2015-05-02 DIAGNOSIS — I10 Essential (primary) hypertension: Secondary | ICD-10-CM | POA: Diagnosis not present

## 2015-05-02 DIAGNOSIS — M4806 Spinal stenosis, lumbar region: Secondary | ICD-10-CM | POA: Diagnosis not present

## 2015-05-02 DIAGNOSIS — S92332D Displaced fracture of third metatarsal bone, left foot, subsequent encounter for fracture with routine healing: Secondary | ICD-10-CM | POA: Diagnosis not present

## 2015-05-02 DIAGNOSIS — S92342D Displaced fracture of fourth metatarsal bone, left foot, subsequent encounter for fracture with routine healing: Secondary | ICD-10-CM | POA: Diagnosis not present

## 2015-05-04 DIAGNOSIS — Z87891 Personal history of nicotine dependence: Secondary | ICD-10-CM | POA: Diagnosis not present

## 2015-05-04 DIAGNOSIS — S93601D Unspecified sprain of right foot, subsequent encounter: Secondary | ICD-10-CM | POA: Diagnosis not present

## 2015-05-04 DIAGNOSIS — M4806 Spinal stenosis, lumbar region: Secondary | ICD-10-CM | POA: Diagnosis not present

## 2015-05-04 DIAGNOSIS — Z9181 History of falling: Secondary | ICD-10-CM | POA: Diagnosis not present

## 2015-05-04 DIAGNOSIS — S92342D Displaced fracture of fourth metatarsal bone, left foot, subsequent encounter for fracture with routine healing: Secondary | ICD-10-CM | POA: Diagnosis not present

## 2015-05-04 DIAGNOSIS — I10 Essential (primary) hypertension: Secondary | ICD-10-CM | POA: Diagnosis not present

## 2015-05-04 DIAGNOSIS — S92322D Displaced fracture of second metatarsal bone, left foot, subsequent encounter for fracture with routine healing: Secondary | ICD-10-CM | POA: Diagnosis not present

## 2015-05-04 DIAGNOSIS — S92332D Displaced fracture of third metatarsal bone, left foot, subsequent encounter for fracture with routine healing: Secondary | ICD-10-CM | POA: Diagnosis not present

## 2015-05-04 DIAGNOSIS — S32048D Other fracture of fourth lumbar vertebra, subsequent encounter for fracture with routine healing: Secondary | ICD-10-CM | POA: Diagnosis not present

## 2015-05-08 DIAGNOSIS — F1120 Opioid dependence, uncomplicated: Secondary | ICD-10-CM | POA: Diagnosis not present

## 2015-05-08 DIAGNOSIS — G894 Chronic pain syndrome: Secondary | ICD-10-CM | POA: Diagnosis not present

## 2015-05-08 DIAGNOSIS — Z79899 Other long term (current) drug therapy: Secondary | ICD-10-CM | POA: Diagnosis not present

## 2015-05-08 DIAGNOSIS — M5440 Lumbago with sciatica, unspecified side: Secondary | ICD-10-CM | POA: Diagnosis not present

## 2015-05-08 DIAGNOSIS — G8929 Other chronic pain: Secondary | ICD-10-CM | POA: Diagnosis not present

## 2015-05-08 DIAGNOSIS — M542 Cervicalgia: Secondary | ICD-10-CM | POA: Diagnosis not present

## 2015-05-09 DIAGNOSIS — Z87891 Personal history of nicotine dependence: Secondary | ICD-10-CM | POA: Diagnosis not present

## 2015-05-09 DIAGNOSIS — S32048D Other fracture of fourth lumbar vertebra, subsequent encounter for fracture with routine healing: Secondary | ICD-10-CM | POA: Diagnosis not present

## 2015-05-09 DIAGNOSIS — S93601D Unspecified sprain of right foot, subsequent encounter: Secondary | ICD-10-CM | POA: Diagnosis not present

## 2015-05-09 DIAGNOSIS — S92342D Displaced fracture of fourth metatarsal bone, left foot, subsequent encounter for fracture with routine healing: Secondary | ICD-10-CM | POA: Diagnosis not present

## 2015-05-09 DIAGNOSIS — I10 Essential (primary) hypertension: Secondary | ICD-10-CM | POA: Diagnosis not present

## 2015-05-09 DIAGNOSIS — Z9181 History of falling: Secondary | ICD-10-CM | POA: Diagnosis not present

## 2015-05-09 DIAGNOSIS — S92322D Displaced fracture of second metatarsal bone, left foot, subsequent encounter for fracture with routine healing: Secondary | ICD-10-CM | POA: Diagnosis not present

## 2015-05-09 DIAGNOSIS — M4806 Spinal stenosis, lumbar region: Secondary | ICD-10-CM | POA: Diagnosis not present

## 2015-05-09 DIAGNOSIS — S92332D Displaced fracture of third metatarsal bone, left foot, subsequent encounter for fracture with routine healing: Secondary | ICD-10-CM | POA: Diagnosis not present

## 2015-05-11 DIAGNOSIS — S32048D Other fracture of fourth lumbar vertebra, subsequent encounter for fracture with routine healing: Secondary | ICD-10-CM | POA: Diagnosis not present

## 2015-05-11 DIAGNOSIS — S93601D Unspecified sprain of right foot, subsequent encounter: Secondary | ICD-10-CM | POA: Diagnosis not present

## 2015-05-11 DIAGNOSIS — S92342D Displaced fracture of fourth metatarsal bone, left foot, subsequent encounter for fracture with routine healing: Secondary | ICD-10-CM | POA: Diagnosis not present

## 2015-05-11 DIAGNOSIS — S92322D Displaced fracture of second metatarsal bone, left foot, subsequent encounter for fracture with routine healing: Secondary | ICD-10-CM | POA: Diagnosis not present

## 2015-05-11 DIAGNOSIS — S92332D Displaced fracture of third metatarsal bone, left foot, subsequent encounter for fracture with routine healing: Secondary | ICD-10-CM | POA: Diagnosis not present

## 2015-05-11 DIAGNOSIS — Z87891 Personal history of nicotine dependence: Secondary | ICD-10-CM | POA: Diagnosis not present

## 2015-05-11 DIAGNOSIS — Z9181 History of falling: Secondary | ICD-10-CM | POA: Diagnosis not present

## 2015-05-11 DIAGNOSIS — I10 Essential (primary) hypertension: Secondary | ICD-10-CM | POA: Diagnosis not present

## 2015-05-11 DIAGNOSIS — M4806 Spinal stenosis, lumbar region: Secondary | ICD-10-CM | POA: Diagnosis not present

## 2015-05-12 ENCOUNTER — Other Ambulatory Visit: Payer: Self-pay | Admitting: Internal Medicine

## 2015-05-12 ENCOUNTER — Telehealth: Payer: Self-pay | Admitting: Internal Medicine

## 2015-05-12 DIAGNOSIS — M79672 Pain in left foot: Principal | ICD-10-CM

## 2015-05-12 DIAGNOSIS — M79671 Pain in right foot: Secondary | ICD-10-CM

## 2015-05-12 NOTE — Telephone Encounter (Signed)
Referral placed.

## 2015-05-12 NOTE — Telephone Encounter (Signed)
Patient was seen at [facility] for fractured feet.  Patient went to Dr.Troxler,Podiatrist.  Patient would like a second opinion.  Patient is taking 180 mg of morphine for back problem and he is still having pain on the balls of his feet.  Patient said he can't put weight on his feet.  He has told Dr.Troxler, but patient feels like he's not listening to him.  Patient doesn't care if he goes to [facility] or [facility].  Patient can go anytime after 05/23/15.

## 2015-05-16 DIAGNOSIS — S92332D Displaced fracture of third metatarsal bone, left foot, subsequent encounter for fracture with routine healing: Secondary | ICD-10-CM | POA: Diagnosis not present

## 2015-05-16 DIAGNOSIS — S93601D Unspecified sprain of right foot, subsequent encounter: Secondary | ICD-10-CM | POA: Diagnosis not present

## 2015-05-16 DIAGNOSIS — M4806 Spinal stenosis, lumbar region: Secondary | ICD-10-CM | POA: Diagnosis not present

## 2015-05-16 DIAGNOSIS — Z9181 History of falling: Secondary | ICD-10-CM | POA: Diagnosis not present

## 2015-05-16 DIAGNOSIS — S92322D Displaced fracture of second metatarsal bone, left foot, subsequent encounter for fracture with routine healing: Secondary | ICD-10-CM | POA: Diagnosis not present

## 2015-05-16 DIAGNOSIS — I10 Essential (primary) hypertension: Secondary | ICD-10-CM | POA: Diagnosis not present

## 2015-05-16 DIAGNOSIS — Z87891 Personal history of nicotine dependence: Secondary | ICD-10-CM | POA: Diagnosis not present

## 2015-05-16 DIAGNOSIS — S92342D Displaced fracture of fourth metatarsal bone, left foot, subsequent encounter for fracture with routine healing: Secondary | ICD-10-CM | POA: Diagnosis not present

## 2015-05-16 DIAGNOSIS — S32048D Other fracture of fourth lumbar vertebra, subsequent encounter for fracture with routine healing: Secondary | ICD-10-CM | POA: Diagnosis not present

## 2015-05-18 DIAGNOSIS — M4806 Spinal stenosis, lumbar region: Secondary | ICD-10-CM | POA: Diagnosis not present

## 2015-05-18 DIAGNOSIS — Z87891 Personal history of nicotine dependence: Secondary | ICD-10-CM | POA: Diagnosis not present

## 2015-05-18 DIAGNOSIS — S92322D Displaced fracture of second metatarsal bone, left foot, subsequent encounter for fracture with routine healing: Secondary | ICD-10-CM | POA: Diagnosis not present

## 2015-05-18 DIAGNOSIS — S93601D Unspecified sprain of right foot, subsequent encounter: Secondary | ICD-10-CM | POA: Diagnosis not present

## 2015-05-18 DIAGNOSIS — I10 Essential (primary) hypertension: Secondary | ICD-10-CM | POA: Diagnosis not present

## 2015-05-18 DIAGNOSIS — Z9181 History of falling: Secondary | ICD-10-CM | POA: Diagnosis not present

## 2015-05-18 DIAGNOSIS — S92332D Displaced fracture of third metatarsal bone, left foot, subsequent encounter for fracture with routine healing: Secondary | ICD-10-CM | POA: Diagnosis not present

## 2015-05-18 DIAGNOSIS — S92342D Displaced fracture of fourth metatarsal bone, left foot, subsequent encounter for fracture with routine healing: Secondary | ICD-10-CM | POA: Diagnosis not present

## 2015-05-18 DIAGNOSIS — S32048D Other fracture of fourth lumbar vertebra, subsequent encounter for fracture with routine healing: Secondary | ICD-10-CM | POA: Diagnosis not present

## 2015-05-23 ENCOUNTER — Ambulatory Visit
Admission: RE | Admit: 2015-05-23 | Discharge: 2015-05-23 | Disposition: A | Discharge status: Home / Self Care | Payer: Medicare Other | Source: Ambulatory Visit | Attending: Internal Medicine | Admitting: Internal Medicine

## 2015-05-23 DIAGNOSIS — M4806 Spinal stenosis, lumbar region: Secondary | ICD-10-CM | POA: Diagnosis not present

## 2015-05-23 DIAGNOSIS — Z13820 Encounter for screening for osteoporosis: Secondary | ICD-10-CM | POA: Insufficient documentation

## 2015-05-23 DIAGNOSIS — Z780 Asymptomatic menopausal state: Secondary | ICD-10-CM | POA: Diagnosis not present

## 2015-05-23 DIAGNOSIS — Z87891 Personal history of nicotine dependence: Secondary | ICD-10-CM | POA: Diagnosis not present

## 2015-05-23 DIAGNOSIS — S92322D Displaced fracture of second metatarsal bone, left foot, subsequent encounter for fracture with routine healing: Secondary | ICD-10-CM | POA: Diagnosis not present

## 2015-05-23 DIAGNOSIS — S93601D Unspecified sprain of right foot, subsequent encounter: Secondary | ICD-10-CM | POA: Diagnosis not present

## 2015-05-23 DIAGNOSIS — S92332D Displaced fracture of third metatarsal bone, left foot, subsequent encounter for fracture with routine healing: Secondary | ICD-10-CM | POA: Diagnosis not present

## 2015-05-23 DIAGNOSIS — S92342D Displaced fracture of fourth metatarsal bone, left foot, subsequent encounter for fracture with routine healing: Secondary | ICD-10-CM | POA: Diagnosis not present

## 2015-05-23 DIAGNOSIS — Z9181 History of falling: Secondary | ICD-10-CM | POA: Diagnosis not present

## 2015-05-23 DIAGNOSIS — M85852 Other specified disorders of bone density and structure, left thigh: Secondary | ICD-10-CM | POA: Insufficient documentation

## 2015-05-23 DIAGNOSIS — I10 Essential (primary) hypertension: Secondary | ICD-10-CM | POA: Diagnosis not present

## 2015-05-23 DIAGNOSIS — S32048D Other fracture of fourth lumbar vertebra, subsequent encounter for fracture with routine healing: Secondary | ICD-10-CM | POA: Diagnosis not present

## 2015-05-26 DIAGNOSIS — S92332D Displaced fracture of third metatarsal bone, left foot, subsequent encounter for fracture with routine healing: Secondary | ICD-10-CM | POA: Diagnosis not present

## 2015-05-26 DIAGNOSIS — S92342D Displaced fracture of fourth metatarsal bone, left foot, subsequent encounter for fracture with routine healing: Secondary | ICD-10-CM | POA: Diagnosis not present

## 2015-05-26 DIAGNOSIS — S93601D Unspecified sprain of right foot, subsequent encounter: Secondary | ICD-10-CM | POA: Diagnosis not present

## 2015-05-26 DIAGNOSIS — S32048D Other fracture of fourth lumbar vertebra, subsequent encounter for fracture with routine healing: Secondary | ICD-10-CM | POA: Diagnosis not present

## 2015-05-26 DIAGNOSIS — Z9181 History of falling: Secondary | ICD-10-CM | POA: Diagnosis not present

## 2015-05-26 DIAGNOSIS — Z87891 Personal history of nicotine dependence: Secondary | ICD-10-CM | POA: Diagnosis not present

## 2015-05-26 DIAGNOSIS — M4806 Spinal stenosis, lumbar region: Secondary | ICD-10-CM | POA: Diagnosis not present

## 2015-05-26 DIAGNOSIS — I10 Essential (primary) hypertension: Secondary | ICD-10-CM | POA: Diagnosis not present

## 2015-05-26 DIAGNOSIS — S92322D Displaced fracture of second metatarsal bone, left foot, subsequent encounter for fracture with routine healing: Secondary | ICD-10-CM | POA: Diagnosis not present

## 2015-05-30 ENCOUNTER — Ambulatory Visit (INDEPENDENT_AMBULATORY_CARE_PROVIDER_SITE_OTHER): Payer: Medicare Other | Admitting: Podiatry

## 2015-05-30 ENCOUNTER — Ambulatory Visit (INDEPENDENT_AMBULATORY_CARE_PROVIDER_SITE_OTHER): Payer: Medicare Other

## 2015-05-30 ENCOUNTER — Encounter: Payer: Self-pay | Admitting: Podiatry

## 2015-05-30 VITALS — BP 118/69 | HR 84 | Resp 12

## 2015-05-30 DIAGNOSIS — G9050 Complex regional pain syndrome I, unspecified: Secondary | ICD-10-CM | POA: Diagnosis not present

## 2015-05-30 DIAGNOSIS — G5792 Unspecified mononeuropathy of left lower limb: Secondary | ICD-10-CM

## 2015-05-30 DIAGNOSIS — G5761 Lesion of plantar nerve, right lower limb: Secondary | ICD-10-CM | POA: Diagnosis not present

## 2015-05-30 DIAGNOSIS — M79673 Pain in unspecified foot: Secondary | ICD-10-CM

## 2015-05-30 DIAGNOSIS — G5762 Lesion of plantar nerve, left lower limb: Secondary | ICD-10-CM

## 2015-05-30 NOTE — Progress Notes (Signed)
Subjective:    Patient ID: Samuel Burke, male    DOB: 10-Oct-1960, 54 y.o.   MRN: [id number]  HPI: He presents today with his family with a chief complaint of pain to the bilateral foot. He states that his legs and feet started to hurt after falling off of his bed 01/27/2015. He states that he has severe spinal stenosis and lumbosacral pain the results in chronic pain and neuropathy to the lower back and thighs. When falling out of bed he fractured 3 bones in both feet and also resulted in a Lisfranc separation left. He is seen a local podiatrist in [place] who has used Cam Walker's to heal the fracture sites without the use of surgery. No surgery was necessary according to the patient. He states that his podiatrist has tried to get him out of his boots and have him start walking in tennis shoes. He states that he would like to do this however he still has severe pain that seems to be worsening across the dorsal aspect of the bilateral foot. He also states that he has radiating pain down the dorsal aspect of the left foot. He sees pain management doctor in [place] and is currently taking 60 mg of morphine a day. He states this does not calm pain in the feet. He states that he cannot even allow the bed covers to rest on his feet. He states that this brings him to tears just trying to walk.    Review of Systems  Musculoskeletal: Positive for back pain and gait problem.       Objective:   Physical Exam: 54 year old male presents with Cam Walker's bilaterally utilizing a walker and assistance. His gait is antalgic and deliberate. Pulses are palpable bilateral. Neurologic sensorium is hyper sensate but seems to be delayed. Deep tendon reflexes are hyperreflexive muscle strength +4 out of 5 also flexors plantar flexors and inverters everters. Orthopedic evaluation demonstrates all joints distal to the ankle have a full range of motion. He has pain on palpation of the cutis as well as peroneal plane  range of motion in Lisfranc's joints left he also has pain on palpation of the deep peroneal nerve left foot. As well as a palpable Mulder's click to the third interdigital space right foot. Radiographic evaluation restraints Lisfranc separation/fracture left foot no current fractures identifiable. Well-healed fractures second third and fourth metatarsals right foot. No current fractures identified. The skin is tight dry and shiny. He denies color changes in the skin. He denies diaphoresis.        Assessment & Plan:  Assessment: Spinal stenosis and chronic pain which has resulted in an RSD-type symptomatology of the bilateral lower extremity. He states that he has seen neurology at [facility] but has not yet seen a neurosurgeon. I also feel that he has Morton's neuroma third interdigital space right foot and deep peroneal neuritis left foot.  Plan: Discussed etiology pathology conservative versus surgical therapies. Recommended he continue to follow-up with his pain clinician and possibly suggest physical therapy to help alleviate some of his RSD type symptoms. At this point I provided single nerve blocks with local anesthetic and some cortisone to the deep peroneal nerve of the left foot dorsally and the third common plantar nerve third interdigital space right foot. I also encouraged him to get out of his Cam Walker's and start utilizing shoes and/or boots. He states upon leaving the office that his feet feel much better and he was ambulating without an antalgic gait.  I will follow-up with him in 1 month I did recommend that he continue to see Dr. Elvina Mattes.  Roselind Messier DPM

## 2015-05-31 DIAGNOSIS — S92332D Displaced fracture of third metatarsal bone, left foot, subsequent encounter for fracture with routine healing: Secondary | ICD-10-CM | POA: Diagnosis not present

## 2015-05-31 DIAGNOSIS — S92322D Displaced fracture of second metatarsal bone, left foot, subsequent encounter for fracture with routine healing: Secondary | ICD-10-CM | POA: Diagnosis not present

## 2015-05-31 DIAGNOSIS — S92342D Displaced fracture of fourth metatarsal bone, left foot, subsequent encounter for fracture with routine healing: Secondary | ICD-10-CM | POA: Diagnosis not present

## 2015-05-31 DIAGNOSIS — S32048D Other fracture of fourth lumbar vertebra, subsequent encounter for fracture with routine healing: Secondary | ICD-10-CM | POA: Diagnosis not present

## 2015-05-31 DIAGNOSIS — M4806 Spinal stenosis, lumbar region: Secondary | ICD-10-CM | POA: Diagnosis not present

## 2015-05-31 DIAGNOSIS — I10 Essential (primary) hypertension: Secondary | ICD-10-CM | POA: Diagnosis not present

## 2015-05-31 DIAGNOSIS — S93601D Unspecified sprain of right foot, subsequent encounter: Secondary | ICD-10-CM | POA: Diagnosis not present

## 2015-05-31 DIAGNOSIS — Z87891 Personal history of nicotine dependence: Secondary | ICD-10-CM | POA: Diagnosis not present

## 2015-05-31 DIAGNOSIS — Z9181 History of falling: Secondary | ICD-10-CM | POA: Diagnosis not present

## 2015-06-02 DIAGNOSIS — S92332D Displaced fracture of third metatarsal bone, left foot, subsequent encounter for fracture with routine healing: Secondary | ICD-10-CM | POA: Diagnosis not present

## 2015-06-02 DIAGNOSIS — I10 Essential (primary) hypertension: Secondary | ICD-10-CM | POA: Diagnosis not present

## 2015-06-02 DIAGNOSIS — M4806 Spinal stenosis, lumbar region: Secondary | ICD-10-CM | POA: Diagnosis not present

## 2015-06-02 DIAGNOSIS — S32048D Other fracture of fourth lumbar vertebra, subsequent encounter for fracture with routine healing: Secondary | ICD-10-CM | POA: Diagnosis not present

## 2015-06-02 DIAGNOSIS — Z87891 Personal history of nicotine dependence: Secondary | ICD-10-CM | POA: Diagnosis not present

## 2015-06-02 DIAGNOSIS — S92342D Displaced fracture of fourth metatarsal bone, left foot, subsequent encounter for fracture with routine healing: Secondary | ICD-10-CM | POA: Diagnosis not present

## 2015-06-02 DIAGNOSIS — S92322D Displaced fracture of second metatarsal bone, left foot, subsequent encounter for fracture with routine healing: Secondary | ICD-10-CM | POA: Diagnosis not present

## 2015-06-02 DIAGNOSIS — Z9181 History of falling: Secondary | ICD-10-CM | POA: Diagnosis not present

## 2015-06-02 DIAGNOSIS — S93601D Unspecified sprain of right foot, subsequent encounter: Secondary | ICD-10-CM | POA: Diagnosis not present

## 2015-06-05 DIAGNOSIS — M79671 Pain in right foot: Secondary | ICD-10-CM | POA: Diagnosis not present

## 2015-06-05 DIAGNOSIS — M79672 Pain in left foot: Secondary | ICD-10-CM | POA: Diagnosis not present

## 2015-06-05 DIAGNOSIS — S92301D Fracture of unspecified metatarsal bone(s), right foot, subsequent encounter for fracture with routine healing: Secondary | ICD-10-CM | POA: Diagnosis not present

## 2015-06-05 DIAGNOSIS — S92302D Fracture of unspecified metatarsal bone(s), left foot, subsequent encounter for fracture with routine healing: Secondary | ICD-10-CM | POA: Diagnosis not present

## 2015-06-06 DIAGNOSIS — S92322D Displaced fracture of second metatarsal bone, left foot, subsequent encounter for fracture with routine healing: Secondary | ICD-10-CM | POA: Diagnosis not present

## 2015-06-06 DIAGNOSIS — S93601D Unspecified sprain of right foot, subsequent encounter: Secondary | ICD-10-CM | POA: Diagnosis not present

## 2015-06-06 DIAGNOSIS — I10 Essential (primary) hypertension: Secondary | ICD-10-CM | POA: Diagnosis not present

## 2015-06-06 DIAGNOSIS — Z87891 Personal history of nicotine dependence: Secondary | ICD-10-CM | POA: Diagnosis not present

## 2015-06-06 DIAGNOSIS — Z9181 History of falling: Secondary | ICD-10-CM | POA: Diagnosis not present

## 2015-06-06 DIAGNOSIS — S32048D Other fracture of fourth lumbar vertebra, subsequent encounter for fracture with routine healing: Secondary | ICD-10-CM | POA: Diagnosis not present

## 2015-06-06 DIAGNOSIS — M4806 Spinal stenosis, lumbar region: Secondary | ICD-10-CM | POA: Diagnosis not present

## 2015-06-06 DIAGNOSIS — S92342D Displaced fracture of fourth metatarsal bone, left foot, subsequent encounter for fracture with routine healing: Secondary | ICD-10-CM | POA: Diagnosis not present

## 2015-06-06 DIAGNOSIS — S92332D Displaced fracture of third metatarsal bone, left foot, subsequent encounter for fracture with routine healing: Secondary | ICD-10-CM | POA: Diagnosis not present

## 2015-06-08 DIAGNOSIS — S92322D Displaced fracture of second metatarsal bone, left foot, subsequent encounter for fracture with routine healing: Secondary | ICD-10-CM | POA: Diagnosis not present

## 2015-06-08 DIAGNOSIS — Z87891 Personal history of nicotine dependence: Secondary | ICD-10-CM | POA: Diagnosis not present

## 2015-06-08 DIAGNOSIS — S92342D Displaced fracture of fourth metatarsal bone, left foot, subsequent encounter for fracture with routine healing: Secondary | ICD-10-CM | POA: Diagnosis not present

## 2015-06-08 DIAGNOSIS — M4806 Spinal stenosis, lumbar region: Secondary | ICD-10-CM | POA: Diagnosis not present

## 2015-06-08 DIAGNOSIS — S92332D Displaced fracture of third metatarsal bone, left foot, subsequent encounter for fracture with routine healing: Secondary | ICD-10-CM | POA: Diagnosis not present

## 2015-06-08 DIAGNOSIS — S93601D Unspecified sprain of right foot, subsequent encounter: Secondary | ICD-10-CM | POA: Diagnosis not present

## 2015-06-08 DIAGNOSIS — I10 Essential (primary) hypertension: Secondary | ICD-10-CM | POA: Diagnosis not present

## 2015-06-08 DIAGNOSIS — S32048D Other fracture of fourth lumbar vertebra, subsequent encounter for fracture with routine healing: Secondary | ICD-10-CM | POA: Diagnosis not present

## 2015-06-08 DIAGNOSIS — Z9181 History of falling: Secondary | ICD-10-CM | POA: Diagnosis not present

## 2015-06-12 DIAGNOSIS — M5440 Lumbago with sciatica, unspecified side: Secondary | ICD-10-CM | POA: Diagnosis not present

## 2015-06-12 DIAGNOSIS — G894 Chronic pain syndrome: Secondary | ICD-10-CM | POA: Diagnosis not present

## 2015-06-12 DIAGNOSIS — Z729 Problem related to lifestyle, unspecified: Secondary | ICD-10-CM | POA: Diagnosis not present

## 2015-06-12 DIAGNOSIS — Z7151 Drug abuse counseling and surveillance of drug abuser: Secondary | ICD-10-CM | POA: Diagnosis not present

## 2015-06-12 DIAGNOSIS — G8929 Other chronic pain: Secondary | ICD-10-CM | POA: Diagnosis not present

## 2015-06-12 DIAGNOSIS — F1120 Opioid dependence, uncomplicated: Secondary | ICD-10-CM | POA: Diagnosis not present

## 2015-06-12 DIAGNOSIS — M542 Cervicalgia: Secondary | ICD-10-CM | POA: Diagnosis not present

## 2015-06-12 DIAGNOSIS — Z79899 Other long term (current) drug therapy: Secondary | ICD-10-CM | POA: Diagnosis not present

## 2015-06-13 DIAGNOSIS — S92342D Displaced fracture of fourth metatarsal bone, left foot, subsequent encounter for fracture with routine healing: Secondary | ICD-10-CM | POA: Diagnosis not present

## 2015-06-13 DIAGNOSIS — M4806 Spinal stenosis, lumbar region: Secondary | ICD-10-CM | POA: Diagnosis not present

## 2015-06-13 DIAGNOSIS — Z87891 Personal history of nicotine dependence: Secondary | ICD-10-CM | POA: Diagnosis not present

## 2015-06-13 DIAGNOSIS — I10 Essential (primary) hypertension: Secondary | ICD-10-CM | POA: Diagnosis not present

## 2015-06-13 DIAGNOSIS — Z9181 History of falling: Secondary | ICD-10-CM | POA: Diagnosis not present

## 2015-06-13 DIAGNOSIS — S93601D Unspecified sprain of right foot, subsequent encounter: Secondary | ICD-10-CM | POA: Diagnosis not present

## 2015-06-13 DIAGNOSIS — S32048D Other fracture of fourth lumbar vertebra, subsequent encounter for fracture with routine healing: Secondary | ICD-10-CM | POA: Diagnosis not present

## 2015-06-13 DIAGNOSIS — S92332D Displaced fracture of third metatarsal bone, left foot, subsequent encounter for fracture with routine healing: Secondary | ICD-10-CM | POA: Diagnosis not present

## 2015-06-13 DIAGNOSIS — S92322D Displaced fracture of second metatarsal bone, left foot, subsequent encounter for fracture with routine healing: Secondary | ICD-10-CM | POA: Diagnosis not present

## 2015-06-14 DIAGNOSIS — S93601D Unspecified sprain of right foot, subsequent encounter: Secondary | ICD-10-CM | POA: Diagnosis not present

## 2015-06-14 DIAGNOSIS — S92332D Displaced fracture of third metatarsal bone, left foot, subsequent encounter for fracture with routine healing: Secondary | ICD-10-CM | POA: Diagnosis not present

## 2015-06-14 DIAGNOSIS — S92342D Displaced fracture of fourth metatarsal bone, left foot, subsequent encounter for fracture with routine healing: Secondary | ICD-10-CM | POA: Diagnosis not present

## 2015-06-14 DIAGNOSIS — S32048D Other fracture of fourth lumbar vertebra, subsequent encounter for fracture with routine healing: Secondary | ICD-10-CM | POA: Diagnosis not present

## 2015-06-14 DIAGNOSIS — Z87891 Personal history of nicotine dependence: Secondary | ICD-10-CM | POA: Diagnosis not present

## 2015-06-14 DIAGNOSIS — M4806 Spinal stenosis, lumbar region: Secondary | ICD-10-CM | POA: Diagnosis not present

## 2015-06-14 DIAGNOSIS — S92322D Displaced fracture of second metatarsal bone, left foot, subsequent encounter for fracture with routine healing: Secondary | ICD-10-CM | POA: Diagnosis not present

## 2015-06-14 DIAGNOSIS — I10 Essential (primary) hypertension: Secondary | ICD-10-CM | POA: Diagnosis not present

## 2015-06-14 DIAGNOSIS — Z9181 History of falling: Secondary | ICD-10-CM | POA: Diagnosis not present

## 2015-06-27 ENCOUNTER — Encounter: Payer: Self-pay | Admitting: Podiatry

## 2015-06-27 ENCOUNTER — Ambulatory Visit (INDEPENDENT_AMBULATORY_CARE_PROVIDER_SITE_OTHER): Payer: Medicare Other | Admitting: Podiatry

## 2015-06-27 VITALS — BP 123/79 | HR 86 | Resp 16

## 2015-06-27 DIAGNOSIS — G5762 Lesion of plantar nerve, left lower limb: Secondary | ICD-10-CM

## 2015-06-27 DIAGNOSIS — G5761 Lesion of plantar nerve, right lower limb: Secondary | ICD-10-CM | POA: Diagnosis not present

## 2015-06-27 DIAGNOSIS — G5792 Unspecified mononeuropathy of left lower limb: Secondary | ICD-10-CM

## 2015-06-27 DIAGNOSIS — G9050 Complex regional pain syndrome I, unspecified: Secondary | ICD-10-CM | POA: Diagnosis not present

## 2015-06-27 NOTE — Progress Notes (Signed)
He presents today for follow-up regarding neuroma third interdigital space right which is some improved and neuritis of the deep peroneal nerve left foot which is not improved. He states that everything felt good for the first few days and then regressed. He denies changes in his past medical history medications allergies surgery social history. He relates to me today that he does have a history of alcoholism.  Objective: Vital signs are stable he is alert and oriented 3. Pulses are palpable. Palpable Mulder's click to the third digitof the right foot and pain on light stroking of the deep peroneal nerve left dorsum of the foot. No open lesions or wounds. Muscle strength is normal.  Assessment: Neuroma third interspace right foot chronic neuritis left foot.  Plan: Injected first dose of dehydrated alcohol third interdigital space right. Injected first dose of dehydrated alcohol to the deep peroneal nerve left foot. I will follow up with him in 3-4 weeks.  Roselind Messier DPM

## 2015-07-03 DIAGNOSIS — M5440 Lumbago with sciatica, unspecified side: Secondary | ICD-10-CM | POA: Diagnosis not present

## 2015-07-03 DIAGNOSIS — M542 Cervicalgia: Secondary | ICD-10-CM | POA: Diagnosis not present

## 2015-07-03 DIAGNOSIS — I10 Essential (primary) hypertension: Secondary | ICD-10-CM | POA: Diagnosis not present

## 2015-07-03 DIAGNOSIS — Z5181 Encounter for therapeutic drug level monitoring: Secondary | ICD-10-CM | POA: Diagnosis not present

## 2015-07-03 DIAGNOSIS — Z79899 Other long term (current) drug therapy: Secondary | ICD-10-CM | POA: Diagnosis not present

## 2015-07-03 DIAGNOSIS — G894 Chronic pain syndrome: Secondary | ICD-10-CM | POA: Diagnosis not present

## 2015-07-03 DIAGNOSIS — G8929 Other chronic pain: Secondary | ICD-10-CM | POA: Diagnosis not present

## 2015-07-03 DIAGNOSIS — Z79891 Long term (current) use of opiate analgesic: Secondary | ICD-10-CM | POA: Diagnosis not present

## 2015-07-03 DIAGNOSIS — F1120 Opioid dependence, uncomplicated: Secondary | ICD-10-CM | POA: Diagnosis not present

## 2015-07-25 ENCOUNTER — Ambulatory Visit (INDEPENDENT_AMBULATORY_CARE_PROVIDER_SITE_OTHER): Payer: Medicare Other | Admitting: Podiatry

## 2015-07-25 ENCOUNTER — Encounter: Payer: Self-pay | Admitting: Podiatry

## 2015-07-25 VITALS — BP 108/70 | HR 79 | Resp 16

## 2015-07-25 DIAGNOSIS — G5761 Lesion of plantar nerve, right lower limb: Secondary | ICD-10-CM | POA: Diagnosis not present

## 2015-07-25 DIAGNOSIS — G9050 Complex regional pain syndrome I, unspecified: Secondary | ICD-10-CM | POA: Diagnosis not present

## 2015-07-25 DIAGNOSIS — G5792 Unspecified mononeuropathy of left lower limb: Secondary | ICD-10-CM

## 2015-07-26 NOTE — Progress Notes (Signed)
He presents today after his first dose of dehydrated alcohol. He states that they initially the injection seemed to help for a few weeks and now they're hurting once again. He states that they do not seem to hurt as badly as they did previously however they are still sore.  Objective: Vital signs are stable alert and oriented 3. Pulses are palpable. He still has pain on palpation to the third interdigital space bilateral.  Assessment: Neuroma and stump neuromas third interdigital space bilateral.  Plan: Injected second dose of dehydrated alcohol to the third interdigital space bilateral foot. Follow up with him in 1 month.

## 2015-07-31 DIAGNOSIS — M5440 Lumbago with sciatica, unspecified side: Secondary | ICD-10-CM | POA: Diagnosis not present

## 2015-07-31 DIAGNOSIS — G8929 Other chronic pain: Secondary | ICD-10-CM | POA: Diagnosis not present

## 2015-07-31 DIAGNOSIS — F1120 Opioid dependence, uncomplicated: Secondary | ICD-10-CM | POA: Diagnosis not present

## 2015-07-31 DIAGNOSIS — G894 Chronic pain syndrome: Secondary | ICD-10-CM | POA: Diagnosis not present

## 2015-07-31 DIAGNOSIS — M542 Cervicalgia: Secondary | ICD-10-CM | POA: Diagnosis not present

## 2015-08-14 DIAGNOSIS — G4700 Insomnia, unspecified: Secondary | ICD-10-CM | POA: Diagnosis not present

## 2015-08-14 DIAGNOSIS — R109 Unspecified abdominal pain: Secondary | ICD-10-CM | POA: Diagnosis not present

## 2015-08-14 DIAGNOSIS — G8929 Other chronic pain: Secondary | ICD-10-CM | POA: Diagnosis not present

## 2015-08-14 DIAGNOSIS — F1120 Opioid dependence, uncomplicated: Secondary | ICD-10-CM | POA: Diagnosis not present

## 2015-08-14 DIAGNOSIS — F419 Anxiety disorder, unspecified: Secondary | ICD-10-CM | POA: Diagnosis not present

## 2015-08-14 DIAGNOSIS — G894 Chronic pain syndrome: Secondary | ICD-10-CM | POA: Diagnosis not present

## 2015-08-22 ENCOUNTER — Ambulatory Visit: Payer: Medicare Other | Admitting: Podiatry

## 2015-08-28 ENCOUNTER — Other Ambulatory Visit: Payer: Self-pay | Admitting: *Deleted

## 2015-08-28 MED ORDER — GABAPENTIN 600 MG PO TABS: 600.0000 mg | ORAL_TABLET | Freq: Three times a day (TID) | ORAL | Status: DC

## 2015-08-28 NOTE — Telephone Encounter (Signed)
Patient left a voicemail requesting a refill on Gabapentin.  Last office visit 04/27/15

## 2015-08-28 NOTE — Telephone Encounter (Signed)
Medication sent electronically 

## 2015-09-04 DIAGNOSIS — G8929 Other chronic pain: Secondary | ICD-10-CM | POA: Diagnosis not present

## 2015-09-04 DIAGNOSIS — G894 Chronic pain syndrome: Secondary | ICD-10-CM | POA: Diagnosis not present

## 2015-09-04 DIAGNOSIS — F1120 Opioid dependence, uncomplicated: Secondary | ICD-10-CM | POA: Diagnosis not present

## 2015-09-04 DIAGNOSIS — M542 Cervicalgia: Secondary | ICD-10-CM | POA: Diagnosis not present

## 2015-09-04 DIAGNOSIS — M5440 Lumbago with sciatica, unspecified side: Secondary | ICD-10-CM | POA: Diagnosis not present

## 2015-09-11 DIAGNOSIS — G8929 Other chronic pain: Secondary | ICD-10-CM | POA: Diagnosis not present

## 2015-09-11 DIAGNOSIS — F419 Anxiety disorder, unspecified: Secondary | ICD-10-CM | POA: Diagnosis not present

## 2015-09-11 DIAGNOSIS — G894 Chronic pain syndrome: Secondary | ICD-10-CM | POA: Diagnosis not present

## 2015-09-11 DIAGNOSIS — R109 Unspecified abdominal pain: Secondary | ICD-10-CM | POA: Diagnosis not present

## 2015-09-11 DIAGNOSIS — G4700 Insomnia, unspecified: Secondary | ICD-10-CM | POA: Diagnosis not present

## 2015-09-11 DIAGNOSIS — F1120 Opioid dependence, uncomplicated: Secondary | ICD-10-CM | POA: Diagnosis not present

## 2015-09-12 DIAGNOSIS — H52223 Regular astigmatism, bilateral: Secondary | ICD-10-CM | POA: Diagnosis not present

## 2015-09-12 DIAGNOSIS — H524 Presbyopia: Secondary | ICD-10-CM | POA: Diagnosis not present

## 2015-09-12 DIAGNOSIS — H5203 Hypermetropia, bilateral: Secondary | ICD-10-CM | POA: Diagnosis not present

## 2015-09-12 DIAGNOSIS — H2513 Age-related nuclear cataract, bilateral: Secondary | ICD-10-CM | POA: Diagnosis not present

## 2015-10-02 DIAGNOSIS — F1120 Opioid dependence, uncomplicated: Secondary | ICD-10-CM | POA: Diagnosis not present

## 2015-10-02 DIAGNOSIS — G894 Chronic pain syndrome: Secondary | ICD-10-CM | POA: Diagnosis not present

## 2015-10-02 DIAGNOSIS — M542 Cervicalgia: Secondary | ICD-10-CM | POA: Diagnosis not present

## 2015-10-02 DIAGNOSIS — M5440 Lumbago with sciatica, unspecified side: Secondary | ICD-10-CM | POA: Diagnosis not present

## 2015-10-02 DIAGNOSIS — G8929 Other chronic pain: Secondary | ICD-10-CM | POA: Diagnosis not present

## 2015-10-09 DIAGNOSIS — G894 Chronic pain syndrome: Secondary | ICD-10-CM | POA: Diagnosis not present

## 2015-10-09 DIAGNOSIS — F1120 Opioid dependence, uncomplicated: Secondary | ICD-10-CM | POA: Diagnosis not present

## 2015-10-09 DIAGNOSIS — G8929 Other chronic pain: Secondary | ICD-10-CM | POA: Diagnosis not present

## 2015-10-09 DIAGNOSIS — R109 Unspecified abdominal pain: Secondary | ICD-10-CM | POA: Diagnosis not present

## 2015-10-09 DIAGNOSIS — G4700 Insomnia, unspecified: Secondary | ICD-10-CM | POA: Diagnosis not present

## 2015-10-09 DIAGNOSIS — F419 Anxiety disorder, unspecified: Secondary | ICD-10-CM | POA: Diagnosis not present

## 2015-10-26 ENCOUNTER — Encounter: Payer: Self-pay | Admitting: Internal Medicine

## 2015-10-26 ENCOUNTER — Ambulatory Visit (INDEPENDENT_AMBULATORY_CARE_PROVIDER_SITE_OTHER): Payer: Medicare Other | Admitting: Internal Medicine

## 2015-10-26 VITALS — BP 116/76 | HR 74 | Temp 97.8°F | Wt 172.5 lb

## 2015-10-26 DIAGNOSIS — I10 Essential (primary) hypertension: Secondary | ICD-10-CM

## 2015-10-26 DIAGNOSIS — M48061 Spinal stenosis, lumbar region without neurogenic claudication: Secondary | ICD-10-CM

## 2015-10-26 DIAGNOSIS — G4700 Insomnia, unspecified: Secondary | ICD-10-CM

## 2015-10-26 DIAGNOSIS — R51 Headache: Secondary | ICD-10-CM

## 2015-10-26 DIAGNOSIS — M4806 Spinal stenosis, lumbar region: Secondary | ICD-10-CM | POA: Diagnosis not present

## 2015-10-26 DIAGNOSIS — R519 Headache, unspecified: Secondary | ICD-10-CM

## 2015-10-26 NOTE — Progress Notes (Signed)
HPI  Pt presents to the clinic today for follow up of chronic conditions.  Frequent Headaches: Occuring daily. The headaches start in the back of his head. The pain does not radiate. The pain is sharp and stabbing. The pain is worse with neck movement and left shoulder movement. He denies sensitivity to light, sound, nausea or vomiting. He does not currently take anything OTC for his headaches.  HTN: BP well controlled on Lisinopril. He denies medication side effects. He does monitor his BP at home and reports it is never > 120/90. His BP today is 116/76.  Spinal Stenosis: He is following with pain management. They have him on Morphine, Voltaren, Neurontin and Baclofen. His pain management doctor is trying to switch him Methadone which he is not happy with that. He follows up with pain management on April 10th.  Insomnia: He reports is taking Clonazepam nightly to help him sleep.  Past Medical History  Diagnosis Date  . History of colon polyps   . Frequent headaches   . Hypertension   . Spinal stenosis     Current Outpatient Prescriptions  Medication Sig Dispense Refill  . baclofen (LIORESAL) 10 MG tablet Take 1 tablet (10 mg total) by mouth 2 (two) times daily as needed. 180 tablet 1  . Cholecalciferol (RA VITAMIN D-3) 2000 UNITS CAPS Take 1 capsule by mouth daily.     . Cholecalciferol (VITAMIN D3) 5000 units CAPS Take 1 capsule by mouth daily.    . clonazePAM (KLONOPIN) 0.5 MG tablet TK 1 T PO QHS PRF INSOMNIA    . diazepam (VALIUM) 5 MG tablet Take 0.5 tablets by mouth at bedtime as needed.  1  . diclofenac (VOLTAREN) 50 MG EC tablet Take 1 tablet (50 mg total) by mouth 2 (two) times daily. 180 tablet 1  . gabapentin (NEURONTIN) 600 MG tablet Take 1 tablet (600 mg total) by mouth 3 (three) times daily. 270 tablet 0  . lisinopril (PRINIVIL,ZESTRIL) 5 MG tablet Take 1 tablet (5 mg total) by mouth at bedtime. 90 tablet 1  . LORazepam (ATIVAN) 0.5 MG tablet Take by mouth.    . morphine  (MS CONTIN) 60 MG 12 hr tablet Take 1 tablet (60 mg total) by mouth 3 (three) times daily. 90 tablet 0   No current facility-administered medications for this visit.    Allergies  Allergen Reactions  . Bee Venom Swelling  . Poison Ivy Extract [Extract Of Poison Ivy] Swelling    Family History  Problem Relation Age of Onset  . Cancer Mother     breast  . Alcohol abuse Father   . Heart disease Father   . Heart disease Brother   . Hypertension Brother     Social History   Social History  . Marital Status: Widowed    Spouse Name: N/A  . Number of Children: N/A  . Years of Education: N/A   Occupational History  . Not on file.   Social History Main Topics  . Smoking status: Former Smoker    Quit date: 08/15/2011  . Smokeless tobacco: Never Used  . Alcohol Use: No     Comment: sober--since 2002  . Drug Use: No  . Sexual Activity: Not Currently   Other Topics Concern  . Not on file   Social History Narrative    ROS:  Constitutional: Pt reports headache. Denies fever, malaise, fatigue, or abrupt weight changes.  HEENT: Denies eye pain, eye redness, ear pain, ringing in the ears, wax buildup, runny  nose, nasal congestion, bloody nose, or sore throat. Respiratory: Denies difficulty breathing, shortness of breath, cough or sputum production.   Cardiovascular: Denies chest pain, chest tightness, palpitations or swelling in the hands or feet.  Musculoskeletal: Pt reports low back pain. Denies muscle pain or joint pain and swelling.  Skin: Denies redness, rashes, lesions or ulcercations.  Neurological: Pt reports insomnia. Denies dizziness, difficulty with memory, difficulty with speech or problems with balance and coordination.   No other specific complaints in a complete review of systems (except as listed in HPI above).  PE:  BP 116/76 mmHg  Pulse 74  Temp(Src) 97.8 F (36.6 C) (Oral)  Wt 172 lb 8 oz (78.245 kg)  SpO2 96%  Wt Readings from Last 3 Encounters:   10/26/15 172 lb 8 oz (78.245 kg)  04/27/15 173 lb (78.472 kg)  01/27/15 174 lb 1.6 oz (78.971 kg)    General: Appears his  stated age, chronically ill appearing, in NAD. HEENT: Head: normal shape and size; Eyes: sclera white, no icterus, conjunctiva pink, PERRLA and EOMs intact; Cardiovascular: Normal rate and rhythm. S1,S2 noted.  No murmur, rubs or gallops noted.  Pulmonary/Chest: Normal effort and positive vesicular breath sounds. No respiratory distress. No wheezes, rales or ronchi noted.  Musculoskeletal: Decreased flexion, extension and rotation of spine. Pain with palpation of the lumbar spine. Strength 4/5 BLE. Using walker for assistance with gait. Neurological: Alert and oriented. Sensation intact to BLE.   Assessment and Plan:

## 2015-10-27 DIAGNOSIS — G4700 Insomnia, unspecified: Secondary | ICD-10-CM | POA: Insufficient documentation

## 2015-10-27 NOTE — Assessment & Plan Note (Signed)
Controlled on Lisinopril

## 2015-10-27 NOTE — Assessment & Plan Note (Signed)
He will continue to follow with pain management  

## 2015-10-27 NOTE — Assessment & Plan Note (Signed)
Continue nightly Clonazepam

## 2015-10-27 NOTE — Patient Instructions (Signed)
Insomnia Insomnia is a sleep disorder that makes it difficult to fall asleep or to stay asleep. Insomnia can cause tiredness (fatigue), low energy, difficulty concentrating, mood swings, and poor performance at work or school.  There are three different ways to classify insomnia:  Difficulty falling asleep.  Difficulty staying asleep.  Waking up too early in the morning. Any type of insomnia can be long-term (chronic) or short-term (acute). Both are common. Short-term insomnia usually lasts for three months or less. Chronic insomnia occurs at least three times a week for longer than three months. CAUSES  Insomnia may be caused by another condition, situation, or substance, such as:  Anxiety.  Certain medicines.  Gastroesophageal reflux disease (GERD) or other gastrointestinal conditions.  Asthma or other breathing conditions.  Restless legs syndrome, sleep apnea, or other sleep disorders.  Chronic pain.  Menopause. This may include hot flashes.  Stroke.  Abuse of alcohol, tobacco, or illegal drugs.  Depression.  Caffeine.   Neurological disorders, such as Alzheimer disease.  An overactive thyroid (hyperthyroidism). The cause of insomnia may not be known. RISK FACTORS Risk factors for insomnia include:  Gender. Women are more commonly affected than men.  Age. Insomnia is more common as you get older.  Stress. This may involve your professional or personal life.  Income. Insomnia is more common in people with lower income.  Lack of exercise.   Irregular work schedule or night shifts.  Traveling between different time zones. SIGNS AND SYMPTOMS If you have insomnia, trouble falling asleep or trouble staying asleep is the main symptom. This may lead to other symptoms, such as:  Feeling fatigued.  Feeling nervous about going to sleep.  Not feeling rested in the morning.  Having trouble concentrating.  Feeling irritable, anxious, or depressed. TREATMENT   Treatment for insomnia depends on the cause. If your insomnia is caused by an underlying condition, treatment will focus on addressing the condition. Treatment may also include:   Medicines to help you sleep.  Counseling or therapy.  Lifestyle adjustments. HOME CARE INSTRUCTIONS   Take medicines only as directed by your health care provider.  Keep regular sleeping and waking hours. Avoid naps.  Keep a sleep diary to help you and your health care provider figure out what could be causing your insomnia. Include:   When you sleep.  When you wake up during the night.  How well you sleep.   How rested you feel the next day.  Any side effects of medicines you are taking.  What you eat and drink.   Make your bedroom a comfortable place where it is easy to fall asleep:  Put up shades or special blackout curtains to block light from outside.  Use a white noise machine to block noise.  Keep the temperature cool.   Exercise regularly as directed by your health care provider. Avoid exercising right before bedtime.  Use relaxation techniques to manage stress. Ask your health care provider to suggest some techniques that may work well for you. These may include:  Breathing exercises.  Routines to release muscle tension.  Visualizing peaceful scenes.  Cut back on alcohol, caffeinated beverages, and cigarettes, especially close to bedtime. These can disrupt your sleep.  Do not overeat or eat spicy foods right before bedtime. This can lead to digestive discomfort that can make it hard for you to sleep.  Limit screen use before bedtime. This includes:  Watching TV.  Using your smartphone, tablet, and computer.  Stick to a routine. This   can help you fall asleep faster. Try to do a quiet activity, brush your teeth, and go to bed at the same time each night.  Get out of bed if you are still awake after 15 minutes of trying to sleep. Keep the lights down, but try reading or  doing a quiet activity. When you feel sleepy, go back to bed.  Make sure that you drive carefully. Avoid driving if you feel very sleepy.  Keep all follow-up appointments as directed by your health care provider. This is important. SEEK MEDICAL CARE IF:   You are tired throughout the day or have trouble in your daily routine due to sleepiness.  You continue to have sleep problems or your sleep problems get worse. SEEK IMMEDIATE MEDICAL CARE IF:   You have serious thoughts about hurting yourself or someone else.   This information is not intended to replace advice given to you by your health care provider. Make sure you discuss any questions you have with your health care provider.   Document Released: 07/12/2000 Document Revised: 04/05/2015 Document Reviewed: 04/15/2014 Elsevier Interactive Patient Education 2016 [employer]  

## 2015-10-27 NOTE — Assessment & Plan Note (Signed)
?   R/T his spinal issues He is already on multiple pain medications, will not add anything to his regimen at this time

## 2015-11-06 DIAGNOSIS — R109 Unspecified abdominal pain: Secondary | ICD-10-CM | POA: Diagnosis not present

## 2015-11-06 DIAGNOSIS — M5440 Lumbago with sciatica, unspecified side: Secondary | ICD-10-CM | POA: Diagnosis not present

## 2015-11-06 DIAGNOSIS — G894 Chronic pain syndrome: Secondary | ICD-10-CM | POA: Diagnosis not present

## 2015-11-06 DIAGNOSIS — M542 Cervicalgia: Secondary | ICD-10-CM | POA: Diagnosis not present

## 2015-11-06 DIAGNOSIS — G8929 Other chronic pain: Secondary | ICD-10-CM | POA: Diagnosis not present

## 2015-11-07 ENCOUNTER — Other Ambulatory Visit: Payer: Self-pay | Admitting: Internal Medicine

## 2015-11-09 ENCOUNTER — Other Ambulatory Visit: Payer: Self-pay | Admitting: Internal Medicine

## 2015-11-09 NOTE — Telephone Encounter (Signed)
Sent electronically 

## 2015-11-09 NOTE — Telephone Encounter (Signed)
Pt has CPE on 04/29/16, last refilled on 04/27/15 #180 with 1 refill, please advise

## 2015-11-20 DIAGNOSIS — G894 Chronic pain syndrome: Secondary | ICD-10-CM | POA: Diagnosis not present

## 2015-11-20 DIAGNOSIS — F1120 Opioid dependence, uncomplicated: Secondary | ICD-10-CM | POA: Diagnosis not present

## 2015-11-20 DIAGNOSIS — R109 Unspecified abdominal pain: Secondary | ICD-10-CM | POA: Diagnosis not present

## 2015-11-20 DIAGNOSIS — F419 Anxiety disorder, unspecified: Secondary | ICD-10-CM | POA: Diagnosis not present

## 2015-11-20 DIAGNOSIS — G4700 Insomnia, unspecified: Secondary | ICD-10-CM | POA: Diagnosis not present

## 2015-12-04 DIAGNOSIS — M542 Cervicalgia: Secondary | ICD-10-CM | POA: Diagnosis not present

## 2015-12-04 DIAGNOSIS — G8929 Other chronic pain: Secondary | ICD-10-CM | POA: Diagnosis not present

## 2015-12-04 DIAGNOSIS — M5440 Lumbago with sciatica, unspecified side: Secondary | ICD-10-CM | POA: Diagnosis not present

## 2015-12-04 DIAGNOSIS — G894 Chronic pain syndrome: Secondary | ICD-10-CM | POA: Diagnosis not present

## 2015-12-04 DIAGNOSIS — R109 Unspecified abdominal pain: Secondary | ICD-10-CM | POA: Diagnosis not present

## 2016-02-05 DIAGNOSIS — G894 Chronic pain syndrome: Secondary | ICD-10-CM | POA: Diagnosis not present

## 2016-02-05 DIAGNOSIS — M5440 Lumbago with sciatica, unspecified side: Secondary | ICD-10-CM | POA: Diagnosis not present

## 2016-02-05 DIAGNOSIS — F1120 Opioid dependence, uncomplicated: Secondary | ICD-10-CM | POA: Diagnosis not present

## 2016-02-05 DIAGNOSIS — G8929 Other chronic pain: Secondary | ICD-10-CM | POA: Diagnosis not present

## 2016-02-05 DIAGNOSIS — Z79899 Other long term (current) drug therapy: Secondary | ICD-10-CM | POA: Diagnosis not present

## 2016-02-05 DIAGNOSIS — M542 Cervicalgia: Secondary | ICD-10-CM | POA: Diagnosis not present

## 2016-02-09 ENCOUNTER — Other Ambulatory Visit: Payer: Self-pay | Admitting: Internal Medicine

## 2016-02-12 ENCOUNTER — Other Ambulatory Visit: Payer: Self-pay | Admitting: Internal Medicine

## 2016-02-12 DIAGNOSIS — M542 Cervicalgia: Secondary | ICD-10-CM | POA: Diagnosis not present

## 2016-02-12 DIAGNOSIS — G8929 Other chronic pain: Secondary | ICD-10-CM | POA: Diagnosis not present

## 2016-02-12 DIAGNOSIS — M5440 Lumbago with sciatica, unspecified side: Secondary | ICD-10-CM | POA: Diagnosis not present

## 2016-02-12 DIAGNOSIS — G894 Chronic pain syndrome: Secondary | ICD-10-CM | POA: Diagnosis not present

## 2016-02-12 DIAGNOSIS — Z79899 Other long term (current) drug therapy: Secondary | ICD-10-CM | POA: Diagnosis not present

## 2016-02-12 DIAGNOSIS — F1120 Opioid dependence, uncomplicated: Secondary | ICD-10-CM | POA: Diagnosis not present

## 2016-03-11 DIAGNOSIS — F1120 Opioid dependence, uncomplicated: Secondary | ICD-10-CM | POA: Diagnosis not present

## 2016-03-11 DIAGNOSIS — F419 Anxiety disorder, unspecified: Secondary | ICD-10-CM | POA: Diagnosis not present

## 2016-03-11 DIAGNOSIS — G894 Chronic pain syndrome: Secondary | ICD-10-CM | POA: Diagnosis not present

## 2016-03-11 DIAGNOSIS — M542 Cervicalgia: Secondary | ICD-10-CM | POA: Diagnosis not present

## 2016-03-11 DIAGNOSIS — M5440 Lumbago with sciatica, unspecified side: Secondary | ICD-10-CM | POA: Diagnosis not present

## 2016-03-11 DIAGNOSIS — G8929 Other chronic pain: Secondary | ICD-10-CM | POA: Diagnosis not present

## 2016-03-11 DIAGNOSIS — Z79899 Other long term (current) drug therapy: Secondary | ICD-10-CM | POA: Diagnosis not present

## 2016-03-21 ENCOUNTER — Other Ambulatory Visit: Payer: Self-pay | Admitting: Internal Medicine

## 2016-03-21 DIAGNOSIS — G8929 Other chronic pain: Secondary | ICD-10-CM

## 2016-03-21 DIAGNOSIS — M549 Dorsalgia, unspecified: Principal | ICD-10-CM

## 2016-03-22 NOTE — Telephone Encounter (Signed)
Last filled 03/2015---please advise if okay to refill

## 2016-03-22 NOTE — Telephone Encounter (Signed)
Sent electronically 

## 2016-04-08 DIAGNOSIS — M5440 Lumbago with sciatica, unspecified side: Secondary | ICD-10-CM | POA: Diagnosis not present

## 2016-04-08 DIAGNOSIS — G8929 Other chronic pain: Secondary | ICD-10-CM | POA: Diagnosis not present

## 2016-04-08 DIAGNOSIS — Z79899 Other long term (current) drug therapy: Secondary | ICD-10-CM | POA: Diagnosis not present

## 2016-04-08 DIAGNOSIS — M542 Cervicalgia: Secondary | ICD-10-CM | POA: Diagnosis not present

## 2016-04-08 DIAGNOSIS — G894 Chronic pain syndrome: Secondary | ICD-10-CM | POA: Diagnosis not present

## 2016-04-15 DIAGNOSIS — G8929 Other chronic pain: Secondary | ICD-10-CM | POA: Diagnosis not present

## 2016-04-15 DIAGNOSIS — Z79899 Other long term (current) drug therapy: Secondary | ICD-10-CM | POA: Diagnosis not present

## 2016-04-15 DIAGNOSIS — M542 Cervicalgia: Secondary | ICD-10-CM | POA: Diagnosis not present

## 2016-04-15 DIAGNOSIS — M5440 Lumbago with sciatica, unspecified side: Secondary | ICD-10-CM | POA: Diagnosis not present

## 2016-04-15 DIAGNOSIS — G894 Chronic pain syndrome: Secondary | ICD-10-CM | POA: Diagnosis not present

## 2016-04-23 ENCOUNTER — Other Ambulatory Visit: Payer: Medicare Other

## 2016-04-29 ENCOUNTER — Ambulatory Visit (INDEPENDENT_AMBULATORY_CARE_PROVIDER_SITE_OTHER): Payer: Medicare Other | Admitting: Internal Medicine

## 2016-04-29 ENCOUNTER — Encounter: Payer: Self-pay | Admitting: Internal Medicine

## 2016-04-29 VITALS — BP 116/74 | HR 72 | Temp 98.7°F | Ht 66.0 in | Wt 184.8 lb

## 2016-04-29 DIAGNOSIS — Z125 Encounter for screening for malignant neoplasm of prostate: Secondary | ICD-10-CM

## 2016-04-29 DIAGNOSIS — Z1159 Encounter for screening for other viral diseases: Secondary | ICD-10-CM

## 2016-04-29 DIAGNOSIS — Z0000 Encounter for general adult medical examination without abnormal findings: Secondary | ICD-10-CM | POA: Diagnosis not present

## 2016-04-29 DIAGNOSIS — M549 Dorsalgia, unspecified: Secondary | ICD-10-CM

## 2016-04-29 DIAGNOSIS — Z23 Encounter for immunization: Secondary | ICD-10-CM

## 2016-04-29 DIAGNOSIS — G8929 Other chronic pain: Secondary | ICD-10-CM

## 2016-04-29 DIAGNOSIS — Z114 Encounter for screening for human immunodeficiency virus [HIV]: Secondary | ICD-10-CM | POA: Diagnosis not present

## 2016-04-29 MED ORDER — DICLOFENAC SODIUM 50 MG PO TBEC: 50.0000 mg | DELAYED_RELEASE_TABLET | Freq: Three times a day (TID) | ORAL | 3 refills | Status: DC

## 2016-04-29 MED ORDER — GABAPENTIN 600 MG PO TABS: 900.0000 mg | ORAL_TABLET | Freq: Three times a day (TID) | ORAL | 3 refills | Status: DC

## 2016-04-29 NOTE — Addendum Note (Signed)
Addended by: Lurlean Nanny on: 04/29/2016 04:47 PM   Modules accepted: Orders

## 2016-04-29 NOTE — Progress Notes (Signed)
HPI:  Pt presents to the clinic today for his Medicare Wellness Exam.  Past Medical History:  Diagnosis Date  . Frequent headaches   . History of colon polyps   . Hypertension   . Spinal stenosis     Current Outpatient Prescriptions  Medication Sig Dispense Refill  . baclofen (LIORESAL) 10 MG tablet TAKE 1 TABLET(10 MG) BY MOUTH TWICE DAILY AS NEEDED 180 tablet 0  . Cholecalciferol (RA VITAMIN D-3) 2000 UNITS CAPS Take 1 capsule by mouth daily.     . Cholecalciferol (VITAMIN D3) 5000 units CAPS Take 1 capsule by mouth daily.    . clonazePAM (KLONOPIN) 0.5 MG tablet TK 1 T PO QHS PRF INSOMNIA    . diclofenac (VOLTAREN) 50 MG EC tablet TAKE 1 TABLET(50 MG) BY MOUTH TWICE DAILY 180 tablet 0  . gabapentin (NEURONTIN) 600 MG tablet TAKE 1 TABLET(600 MG) BY MOUTH THREE TIMES DAILY 270 tablet 3  . lisinopril (PRINIVIL,ZESTRIL) 5 MG tablet TAKE 1 TABLET(5 MG) BY MOUTH AT BEDTIME 90 tablet 0  . morphine (MS CONTIN) 60 MG 12 hr tablet Take 1 tablet (60 mg total) by mouth 3 (three) times daily. 90 tablet 0   No current facility-administered medications for this visit.     Allergies  Allergen Reactions  . Bee Venom Swelling  . Poison Ivy Extract [Poison Ivy Extract] Swelling    Family History  Problem Relation Age of Onset  . Cancer Mother     breast  . Alcohol abuse Father   . Heart disease Father   . Heart disease Brother   . Hypertension Brother     Social History   Social History  . Marital status: Widowed    Spouse name: N/A  . Number of children: N/A  . Years of education: N/A   Occupational History  . Not on file.   Social History Main Topics  . Smoking status: Former Smoker    Quit date: 08/15/2011  . Smokeless tobacco: Never Used  . Alcohol use No     Comment: sober--since 2002  . Drug use: No  . Sexual activity: Not Currently   Other Topics Concern  . Not on file   Social History Narrative  . No narrative on file    Hospitiliaztions: 01/27/2015-  Vertebral Fracture  Health Maintenance:    Flu: 03/2015- wants one today  Tetanus: 01/27/2015  PSA: 03/2015  Bone Density: 04/2015  Colon Screening: 08/2013 (5 years)  Eye Doctor: yearly  Dental Exam: yearly   Providers:   PCP: Webb Silversmith, NP-C  Podiatrist: Dr. Elvina Mattes  Pain Specialist: Dr. Humphrey Rolls  Optometrist: he is establishing care  Dentist: [facility]   I have personally reviewed and have noted:  1. The patient's medical and social history 2. Their use of alcohol, tobacco or illicit drugs 3. Their current medications and supplements 4. The patient's functional ability including ADL's, fall risks, home safety  risks and hearing or visual impairment. 5. Diet and physical activities 6. Evidence for depression or mood disorder   Objective:  PE:   BP 116/74   Pulse 72   Temp 98.7 F (37.1 C) (Oral)   Ht 5\' 6"  (1.676 m)   Wt 184 lb 12 oz (83.8 kg)   SpO2 97%   BMI 29.82 kg/m    Wt Readings from Last 3 Encounters:  10/26/15 172 lb 8 oz (78.2 kg)  04/27/15 173 lb (78.5 kg)  01/27/15 174 lb 1.6 oz (79 kg)    General:  Appears his stated age, in NAD. Cardiovascular: Normal rate and rhythm. S1,S2 noted.   Pulmonary/Chest: Normal effort and positive vesicular breath sounds. No respiratory distress. No wheezes, rales or ronchi noted.  Psych: Mood and affect normal. Behavior and judgement are normal.  BMET    Component Value Date/Time   NA 140 01/31/2015 0503   K 4.1 01/31/2015 0503   CL 104 01/31/2015 0503   CO2 31 01/31/2015 0503   GLUCOSE 106 (H) 01/31/2015 0503   BUN 14 01/31/2015 0503   CREATININE 1.15 01/31/2015 0503   CALCIUM 9.0 01/31/2015 0503   GFRNONAA >60 01/31/2015 0503   GFRAA >60 01/31/2015 0503    Lipid Panel  No results found for: CHOL, TRIG, HDL, CHOLHDL, VLDL, LDLCALC  CBC    Component Value Date/Time   WBC 15.6 (H) 01/27/2015 1118   RBC 4.40 01/27/2015 1118   HGB 13.4 01/27/2015 1118   HCT 40.1 01/27/2015 1118   PLT 196  01/27/2015 1118   MCV 91.1 01/27/2015 1118   MCH 30.5 01/27/2015 1118   MCHC 33.4 01/27/2015 1118   RDW 13.1 01/27/2015 1118   LYMPHSABS 1.4 01/27/2015 1118   MONOABS 1.0 01/27/2015 1118   EOSABS 0.0 01/27/2015 1118   BASOSABS 0.1 01/27/2015 1118    Hgb A1C No results found for: HGBA1C    Assessment and Plan:   Medicare Annual Wellness Visit:  Diet: He is lactose intolerant so he avoids dairy. He also avoids spicy foods. He does eat meat. He eats fruits and veggies daily. He does eat some fried food. Physical activity: Sedentary Depression/mood screen: Negative Hearing: Intact to whispered voice Visual acuity: Grossly normal, performs annual eye exam . Has reading glasses. ADLs: Capable, with the use of a walker Fall risk: High, problems with balance with gait Home safety: Good Cognitive evaluation: Intact to orientation, naming, recall and repetition EOL planning: Adv directives, full code/ I agree  Preventative Medicine: Flu shot today. Tetanus UTD. Colonoscopy due 2020. Encouraged him to see an eye doctor and dentist annually. Will check CBC, CMET, Lipid, PSA, HIV and Hep C.   Next appointment: 6 month follow up chronic conditions

## 2016-04-29 NOTE — Patient Instructions (Signed)

## 2016-04-30 LAB — COMPREHENSIVE METABOLIC PANEL
ALT: 25 U/L (ref 0–53)
AST: 22 U/L (ref 0–37)
Albumin: 4 g/dL (ref 3.5–5.2)
Alkaline Phosphatase: 58 U/L (ref 39–117)
BILIRUBIN TOTAL: 0.8 mg/dL (ref 0.2–1.2)
BUN: 17 mg/dL (ref 6–23)
CO2: 30 meq/L (ref 19–32)
CREATININE: 1.31 mg/dL (ref 0.40–1.50)
Calcium: 9.2 mg/dL (ref 8.4–10.5)
Chloride: 101 mEq/L (ref 96–112)
GFR: 59.89 mL/min — AB (ref >60.00)
GLUCOSE: 128 mg/dL — AB (ref 70–99)
Potassium: 4 mEq/L (ref 3.5–5.1)
SODIUM: 139 meq/L (ref 135–145)
TOTAL PROTEIN: 6.8 g/dL (ref 6.0–8.3)

## 2016-04-30 LAB — CBC
HCT: 43.8 % (ref 39.0–52.0)
Hemoglobin: 14.7 g/dL (ref 13.0–17.0)
MCHC: 33.6 g/dL (ref 30.0–36.0)
MCV: 90.3 fl (ref 78.0–100.0)
Platelets: 226 10*3/uL (ref 150.0–400.0)
RBC: 4.85 Mil/uL (ref 4.22–5.81)
RDW: 13.8 % (ref 11.5–15.5)
WBC: 7.6 10*3/uL (ref 4.0–10.5)

## 2016-04-30 LAB — PSA, MEDICARE: PSA: 0.34 ng/mL (ref 0.10–4.00)

## 2016-04-30 LAB — LIPID PANEL
CHOL/HDL RATIO: 5
Cholesterol: 152 mg/dL (ref 0–200)
HDL: 33.5 mg/dL — ABNORMAL LOW (ref >39.00)
LDL Cholesterol: 97 mg/dL (ref 0–99)
NONHDL: 118.47
Triglycerides: 109 mg/dL (ref 0.0–149.0)
VLDL: 21.8 mg/dL (ref 0.0–40.0)

## 2016-04-30 LAB — HIV ANTIBODY (ROUTINE TESTING W REFLEX): HIV: NONREACTIVE

## 2016-04-30 LAB — HEPATITIS C ANTIBODY: HCV AB: NEGATIVE

## 2016-05-06 ENCOUNTER — Telehealth: Payer: Self-pay | Admitting: Internal Medicine

## 2016-05-06 NOTE — Telephone Encounter (Signed)
I let patient know Samuel Burke sent in new prescriptions to [facility].  Patient said when he called pharmacy before he called [place] they said they only have the old prescriptions.  Can the prescriptions be sent to [facility] again?  Patient's brother is out and has told patient he would pick up his medication for him.  Please call patient when prescription is sent to pharmacy.

## 2016-05-07 MED ORDER — BACLOFEN 10 MG PO TABS: 10.0000 mg | ORAL_TABLET | Freq: Two times a day (BID) | ORAL | 1 refills | Status: DC

## 2016-05-07 MED ORDER — LISINOPRIL 5 MG PO TABS: 5.0000 mg | ORAL_TABLET | Freq: Every day | ORAL | 1 refills | Status: DC

## 2016-05-07 NOTE — Telephone Encounter (Signed)
Called pharmacy and they stated they received increase in quantity for Diclofenac and Gabapentin but they said pt requested increase in quantity of Baclofen

## 2016-05-07 NOTE — Telephone Encounter (Signed)
I told Samuel Burke we were not increasing the Baclofen, that we would increase the Diclofenac and Gabapentin.

## 2016-05-07 NOTE — Addendum Note (Signed)
Addended by: Lurlean Nanny on: 05/07/2016 04:30 PM   Modules accepted: Orders

## 2016-05-30 DIAGNOSIS — G894 Chronic pain syndrome: Secondary | ICD-10-CM | POA: Diagnosis not present

## 2016-05-30 DIAGNOSIS — M542 Cervicalgia: Secondary | ICD-10-CM | POA: Diagnosis not present

## 2016-05-30 DIAGNOSIS — M5440 Lumbago with sciatica, unspecified side: Secondary | ICD-10-CM | POA: Diagnosis not present

## 2016-05-30 DIAGNOSIS — M545 Low back pain: Secondary | ICD-10-CM | POA: Diagnosis not present

## 2016-06-10 DIAGNOSIS — G8929 Other chronic pain: Secondary | ICD-10-CM | POA: Diagnosis not present

## 2016-06-10 DIAGNOSIS — M5440 Lumbago with sciatica, unspecified side: Secondary | ICD-10-CM | POA: Diagnosis not present

## 2016-06-10 DIAGNOSIS — G894 Chronic pain syndrome: Secondary | ICD-10-CM | POA: Diagnosis not present

## 2016-06-10 DIAGNOSIS — M542 Cervicalgia: Secondary | ICD-10-CM | POA: Diagnosis not present

## 2016-07-04 DIAGNOSIS — G8929 Other chronic pain: Secondary | ICD-10-CM | POA: Diagnosis not present

## 2016-07-04 DIAGNOSIS — G894 Chronic pain syndrome: Secondary | ICD-10-CM | POA: Diagnosis not present

## 2016-07-04 DIAGNOSIS — M5440 Lumbago with sciatica, unspecified side: Secondary | ICD-10-CM | POA: Diagnosis not present

## 2016-07-04 DIAGNOSIS — M542 Cervicalgia: Secondary | ICD-10-CM | POA: Diagnosis not present

## 2016-08-12 DIAGNOSIS — M542 Cervicalgia: Secondary | ICD-10-CM | POA: Diagnosis not present

## 2016-08-12 DIAGNOSIS — M545 Low back pain: Secondary | ICD-10-CM | POA: Diagnosis not present

## 2016-08-12 DIAGNOSIS — G894 Chronic pain syndrome: Secondary | ICD-10-CM | POA: Diagnosis not present

## 2016-08-12 DIAGNOSIS — M5440 Lumbago with sciatica, unspecified side: Secondary | ICD-10-CM | POA: Diagnosis not present

## 2016-08-12 DIAGNOSIS — G8929 Other chronic pain: Secondary | ICD-10-CM | POA: Diagnosis not present

## 2016-10-07 DIAGNOSIS — G894 Chronic pain syndrome: Secondary | ICD-10-CM | POA: Diagnosis not present

## 2016-10-07 DIAGNOSIS — G8929 Other chronic pain: Secondary | ICD-10-CM | POA: Diagnosis not present

## 2016-10-07 DIAGNOSIS — M5440 Lumbago with sciatica, unspecified side: Secondary | ICD-10-CM | POA: Diagnosis not present

## 2016-10-07 DIAGNOSIS — Z79899 Other long term (current) drug therapy: Secondary | ICD-10-CM | POA: Diagnosis not present

## 2016-10-07 DIAGNOSIS — M542 Cervicalgia: Secondary | ICD-10-CM | POA: Diagnosis not present

## 2016-10-18 ENCOUNTER — Other Ambulatory Visit: Payer: Self-pay | Admitting: Internal Medicine

## 2016-10-18 NOTE — Telephone Encounter (Signed)
Baclofen last filled 05/07/16 with 1 refill please advise

## 2016-12-02 DIAGNOSIS — G8929 Other chronic pain: Secondary | ICD-10-CM | POA: Diagnosis not present

## 2016-12-02 DIAGNOSIS — G894 Chronic pain syndrome: Secondary | ICD-10-CM | POA: Diagnosis not present

## 2016-12-02 DIAGNOSIS — M5440 Lumbago with sciatica, unspecified side: Secondary | ICD-10-CM | POA: Diagnosis not present

## 2016-12-02 DIAGNOSIS — Z79899 Other long term (current) drug therapy: Secondary | ICD-10-CM | POA: Diagnosis not present

## 2016-12-02 DIAGNOSIS — M542 Cervicalgia: Secondary | ICD-10-CM | POA: Diagnosis not present

## 2016-12-28 DIAGNOSIS — M542 Cervicalgia: Secondary | ICD-10-CM | POA: Diagnosis not present

## 2016-12-28 DIAGNOSIS — G894 Chronic pain syndrome: Secondary | ICD-10-CM | POA: Diagnosis not present

## 2016-12-28 DIAGNOSIS — Z79899 Other long term (current) drug therapy: Secondary | ICD-10-CM | POA: Diagnosis not present

## 2016-12-28 DIAGNOSIS — G8929 Other chronic pain: Secondary | ICD-10-CM | POA: Diagnosis not present

## 2016-12-28 DIAGNOSIS — M5440 Lumbago with sciatica, unspecified side: Secondary | ICD-10-CM | POA: Diagnosis not present

## 2017-01-20 ENCOUNTER — Other Ambulatory Visit: Payer: Self-pay | Admitting: Internal Medicine

## 2017-01-20 NOTE — Telephone Encounter (Signed)
Baclofen last filled 10/18/2016... Please advise

## 2017-02-10 DIAGNOSIS — M542 Cervicalgia: Secondary | ICD-10-CM | POA: Diagnosis not present

## 2017-02-10 DIAGNOSIS — G894 Chronic pain syndrome: Secondary | ICD-10-CM | POA: Diagnosis not present

## 2017-02-10 DIAGNOSIS — M5440 Lumbago with sciatica, unspecified side: Secondary | ICD-10-CM | POA: Diagnosis not present

## 2017-02-10 DIAGNOSIS — Z79899 Other long term (current) drug therapy: Secondary | ICD-10-CM | POA: Diagnosis not present

## 2017-02-10 DIAGNOSIS — G8929 Other chronic pain: Secondary | ICD-10-CM | POA: Diagnosis not present

## 2017-03-10 ENCOUNTER — Other Ambulatory Visit: Payer: Self-pay | Admitting: Internal Medicine

## 2017-03-11 NOTE — Telephone Encounter (Signed)
Last filled 01/20/2017... Please advise

## 2017-03-17 DIAGNOSIS — M542 Cervicalgia: Secondary | ICD-10-CM | POA: Diagnosis not present

## 2017-03-17 DIAGNOSIS — G8929 Other chronic pain: Secondary | ICD-10-CM | POA: Diagnosis not present

## 2017-03-17 DIAGNOSIS — Z79899 Other long term (current) drug therapy: Secondary | ICD-10-CM | POA: Diagnosis not present

## 2017-03-17 DIAGNOSIS — G894 Chronic pain syndrome: Secondary | ICD-10-CM | POA: Diagnosis not present

## 2017-03-17 DIAGNOSIS — M5440 Lumbago with sciatica, unspecified side: Secondary | ICD-10-CM | POA: Diagnosis not present

## 2017-03-24 DIAGNOSIS — G894 Chronic pain syndrome: Secondary | ICD-10-CM | POA: Diagnosis not present

## 2017-03-24 DIAGNOSIS — M5440 Lumbago with sciatica, unspecified side: Secondary | ICD-10-CM | POA: Diagnosis not present

## 2017-03-24 DIAGNOSIS — Z79899 Other long term (current) drug therapy: Secondary | ICD-10-CM | POA: Diagnosis not present

## 2017-03-24 DIAGNOSIS — M542 Cervicalgia: Secondary | ICD-10-CM | POA: Diagnosis not present

## 2017-04-10 ENCOUNTER — Telehealth: Payer: Self-pay | Admitting: Internal Medicine

## 2017-04-10 NOTE — Telephone Encounter (Signed)
Patient Name: Samuel Burke  DOB: Dec 21, 1960    Initial Comment caller states he has painful blisters on the palm of his hand. he is allergic to poison ivy and oak. He is wanting to schedule an appointment.   Nurse Assessment  Nurse: Raphael Gibney, RN, Vera Date/Time (Eastern Time): 04/10/2017 1:27:59 PM  Confirm and document reason for call. If symptomatic, describe symptoms. ---Caller states he has painful blisters on the palm of his right hand. Has dry skin. No fever.  Does the patient have any new or worsening symptoms? ---Yes  Will a triage be completed? ---Yes  Related visit to physician within the last 2 weeks? ---No  Does the PT have any chronic conditions? (i.e. diabetes, asthma, etc.) ---No  Is this a behavioral health or substance abuse call? ---No     Guidelines    Guideline Title Affirmed Question Affirmed Notes  Blister - Foot and Hand [1] Cause unknown AND [2] new blisters are developing    Final Disposition User   See Physician within 24 Hours Stringer, RN, Vanita Ingles    Comments  pt does not want to schedule appt at this time. He needs to talk to his ride to see if he is available tomorrow to bring him.   Referrals  REFERRED TO PCP OFFICE   Disagree/Comply: Comply

## 2017-04-11 ENCOUNTER — Ambulatory Visit (INDEPENDENT_AMBULATORY_CARE_PROVIDER_SITE_OTHER): Payer: Medicare Other | Admitting: Primary Care

## 2017-04-11 ENCOUNTER — Encounter: Payer: Self-pay | Admitting: Primary Care

## 2017-04-11 VITALS — BP 122/78 | HR 72 | Temp 97.7°F | Ht 66.0 in | Wt 190.0 lb

## 2017-04-11 DIAGNOSIS — R21 Rash and other nonspecific skin eruption: Secondary | ICD-10-CM | POA: Diagnosis not present

## 2017-04-11 MED ORDER — NYSTATIN 100000 UNIT/GM EX OINT: 1.0000 "application " | TOPICAL_OINTMENT | Freq: Two times a day (BID) | CUTANEOUS | 0 refills | Status: DC

## 2017-04-11 NOTE — Progress Notes (Signed)
Subjective:    Patient ID: Samuel Burke, male    DOB: Jun 03, 1961, 56 y.o.   MRN: [id number]  HPI  Mr. Arndt is a 56 year old male who presents today with a chief complaint of rash.  Several days ago he noticed 2 small bumps to the right palmer surface of his hands. These bumps are painful which he notices when opening a bottle. One week ago he noticed a "red ring" under his left arm. He sweats a lot and does not move his upper extremity much due to chronic neck and shoulder pain.  He's tried using OTC topical cream with some improvement but no resolve. He does sweat a lot under the left arm.   He denies being outdoors in the woods, changes in soaps/detergents/food/medication, tick bites. He endorses increased stress but doesn't think it's contributing to the bumps on his hand. He does endorse a history of picking at spots on his skin until they get irritated.   Review of Systems  Constitutional: Negative for fever.  Skin: Positive for rash.       Past Medical History:  Diagnosis Date  . Frequent headaches   . History of colon polyps   . Hypertension   . Spinal stenosis      Social History   Social History  . Marital status: Widowed    Spouse name: N/A  . Number of children: N/A  . Years of education: N/A   Occupational History  . Not on file.   Social History Main Topics  . Smoking status: Former Smoker    Quit date: 08/15/2011  . Smokeless tobacco: Never Used  . Alcohol use No     Comment: sober--since 2002  . Drug use: No  . Sexual activity: Not Currently   Other Topics Concern  . Not on file   Social History Narrative  . No narrative on file    Past Surgical History:  Procedure Laterality Date  . CHOLECYSTECTOMY  2015    Family History  Problem Relation Age of Onset  . Cancer Mother        breast  . Alcohol abuse Father   . Heart disease Father   . Heart disease Brother   . Hypertension Brother     Allergies  Allergen Reactions  . Bee  Venom Swelling  . Poison Ivy Extract [Poison Ivy Extract] Swelling    Current Outpatient Prescriptions on File Prior to Visit  Medication Sig Dispense Refill  . baclofen (LIORESAL) 10 MG tablet TAKE 1 TABLET(10 MG) BY MOUTH TWICE DAILY 180 tablet 0  . Cholecalciferol (RA VITAMIN D-3) 2000 UNITS CAPS Take 1 capsule by mouth daily.     . Cholecalciferol (VITAMIN D3) 5000 units CAPS Take 1 capsule by mouth daily.    . diazepam (VALIUM) 5 MG tablet Take 2.5 mg by mouth daily.   2  . diclofenac (VOLTAREN) 50 MG EC tablet Take 1 tablet (50 mg total) by mouth 3 (three) times daily. 270 tablet 3  . gabapentin (NEURONTIN) 600 MG tablet Take 1.5 tablets (900 mg total) by mouth 3 (three) times daily. 405 tablet 3  . lisinopril (PRINIVIL,ZESTRIL) 5 MG tablet TAKE 1 TABLET(5 MG) BY MOUTH DAILY 90 tablet 0  . morphine (MS CONTIN) 60 MG 12 hr tablet Take 1 tablet (60 mg total) by mouth 3 (three) times daily. 90 tablet 0   No current facility-administered medications on file prior to visit.     BP 122/78   Pulse  72   Temp 97.7 F (36.5 C) (Oral)   Ht 5\' 6"  (1.676 m)   Wt 190 lb (86.2 kg)   SpO2 97%   BMI 30.67 kg/m    Objective:   Physical Exam  Constitutional: He appears well-nourished.  Neck: Neck supple.  Skin: Skin is warm and dry. Rash noted.  Two pinpoint, raised, flesh colored bumps to right palmer hand.  Moderate erythema to left axilla, appears irritated.           Assessment & Plan:  Rash:  Unsure of etiology of bumps on right palmer hand. Does not appear to be hives, poison ivy/oak dermatitis, infection.  Rash to left axilla representative of skin irritation, likely from lack of movement of left upper extremity from spinal stenosis. Also with possible fungal involvement. Rx for Nystatin ointment sent to pharmacy.  Discussed to use Gold Bond Powder to prevent moisture. Follow up PRN.  Sheral Flow, NP

## 2017-04-11 NOTE — Patient Instructions (Signed)
Start Claritin once daily for the rash to your hand.  Start the Nystatin ointment, apply twice daily under the left arm until the rash resolves.  Start using Gold Bond Powder after your rash heals under the left arm to prevent sweating. Sweating is keeping the under arm moist which creates breakdown and irritation.  It was a pleasure meeting you!

## 2017-04-22 ENCOUNTER — Other Ambulatory Visit: Payer: Self-pay | Admitting: Internal Medicine

## 2017-05-01 ENCOUNTER — Encounter: Payer: Self-pay | Admitting: Internal Medicine

## 2017-05-01 ENCOUNTER — Ambulatory Visit (INDEPENDENT_AMBULATORY_CARE_PROVIDER_SITE_OTHER): Payer: Medicare Other | Admitting: Internal Medicine

## 2017-05-01 VITALS — BP 122/74 | HR 92 | Temp 98.1°F | Ht 66.0 in | Wt 188.0 lb

## 2017-05-01 DIAGNOSIS — R51 Headache: Secondary | ICD-10-CM

## 2017-05-01 DIAGNOSIS — M48061 Spinal stenosis, lumbar region without neurogenic claudication: Secondary | ICD-10-CM | POA: Diagnosis not present

## 2017-05-01 DIAGNOSIS — I10 Essential (primary) hypertension: Secondary | ICD-10-CM | POA: Diagnosis not present

## 2017-05-01 DIAGNOSIS — Z0000 Encounter for general adult medical examination without abnormal findings: Secondary | ICD-10-CM | POA: Diagnosis not present

## 2017-05-01 DIAGNOSIS — G4700 Insomnia, unspecified: Secondary | ICD-10-CM | POA: Diagnosis not present

## 2017-05-01 DIAGNOSIS — Z23 Encounter for immunization: Secondary | ICD-10-CM | POA: Diagnosis not present

## 2017-05-01 DIAGNOSIS — Z125 Encounter for screening for malignant neoplasm of prostate: Secondary | ICD-10-CM | POA: Diagnosis not present

## 2017-05-01 DIAGNOSIS — R519 Headache, unspecified: Secondary | ICD-10-CM

## 2017-05-01 LAB — COMPREHENSIVE METABOLIC PANEL
ALBUMIN: 4.2 g/dL (ref 3.5–5.2)
ALK PHOS: 65 U/L (ref 39–117)
ALT: 35 U/L (ref 0–53)
AST: 20 U/L (ref 0–37)
BUN: 24 mg/dL — AB (ref 6–23)
CO2: 31 mEq/L (ref 19–32)
CREATININE: 1.35 mg/dL (ref 0.40–1.50)
Calcium: 9.4 mg/dL (ref 8.4–10.5)
Chloride: 101 mEq/L (ref 96–112)
GFR: 57.64 mL/min — ABNORMAL LOW (ref >60.00)
Glucose, Bld: 103 mg/dL — ABNORMAL HIGH (ref 70–99)
Potassium: 4.2 mEq/L (ref 3.5–5.1)
SODIUM: 138 meq/L (ref 135–145)
TOTAL PROTEIN: 6.8 g/dL (ref 6.0–8.3)
Total Bilirubin: 0.7 mg/dL (ref 0.2–1.2)

## 2017-05-01 LAB — LIPID PANEL
CHOL/HDL RATIO: 4
CHOLESTEROL: 157 mg/dL (ref 0–200)
HDL: 35.3 mg/dL — ABNORMAL LOW (ref >39.00)
LDL CALC: 94 mg/dL (ref 0–99)
NONHDL: 121.74
Triglycerides: 139 mg/dL (ref 0.0–149.0)
VLDL: 27.8 mg/dL (ref 0.0–40.0)

## 2017-05-01 LAB — CBC
HCT: 42.5 % (ref 39.0–52.0)
Hemoglobin: 14 g/dL (ref 13.0–17.0)
MCHC: 33 g/dL (ref 30.0–36.0)
MCV: 93 fl (ref 78.0–100.0)
PLATELETS: 221 10*3/uL (ref 150.0–400.0)
RBC: 4.57 Mil/uL (ref 4.22–5.81)
RDW: 13.5 % (ref 11.5–15.5)
WBC: 6.7 10*3/uL (ref 4.0–10.5)

## 2017-05-01 LAB — PSA, MEDICARE: PSA: 0.38 ng/mL (ref 0.10–4.00)

## 2017-05-01 MED ORDER — GABAPENTIN 600 MG PO TABS: ORAL_TABLET | ORAL | 3 refills | Status: DC

## 2017-05-01 MED ORDER — LISINOPRIL 5 MG PO TABS: ORAL_TABLET | ORAL | 3 refills | Status: DC

## 2017-05-01 MED ORDER — BACLOFEN 10 MG PO TABS: ORAL_TABLET | ORAL | 3 refills | Status: DC

## 2017-05-01 MED ORDER — DICLOFENAC SODIUM 50 MG PO TBEC: 50.0000 mg | DELAYED_RELEASE_TABLET | Freq: Three times a day (TID) | ORAL | 3 refills | Status: DC

## 2017-05-01 NOTE — Assessment & Plan Note (Signed)
Debilitating He will continue to follow with pain management

## 2017-05-01 NOTE — Assessment & Plan Note (Signed)
He will continue Valium for now

## 2017-05-01 NOTE — Patient Instructions (Signed)

## 2017-05-01 NOTE — Progress Notes (Signed)
HPI:  Pt presents to the clinic today for his Medicare Wellness Exam. He is also due to follow up chronic conditions.  Frequent Headaches: Chronic, occurring multiple times per week. He reports he does not take anything outside of his prescription medications for these, he just deals with them.  HTN: His BP today is 122/74. He is taking Lisinopril as prescribed. ECG from 01/2015 reviewed.  Spinal Stenosis: He follows with pain management. He is taking MS Contin, Voltaren, Gabapentin and Baclofen as prescribed.   Insomnia: Chronic but stable on Valium.  Past Medical History:  Diagnosis Date  . Frequent headaches   . History of colon polyps   . Hypertension   . Spinal stenosis     Current Outpatient Prescriptions  Medication Sig Dispense Refill  . baclofen (LIORESAL) 10 MG tablet TAKE 1 TABLET(10 MG) BY MOUTH TWICE DAILY 180 tablet 0  . Cholecalciferol (RA VITAMIN D-3) 2000 UNITS CAPS Take 1 capsule by mouth daily.     . Cholecalciferol (VITAMIN D3) 5000 units CAPS Take 1 capsule by mouth daily.    . diazepam (VALIUM) 5 MG tablet Take 2.5 mg by mouth daily.   2  . diclofenac (VOLTAREN) 50 MG EC tablet Take 1 tablet (50 mg total) by mouth 3 (three) times daily. 270 tablet 3  . gabapentin (NEURONTIN) 600 MG tablet TAKE 1 AND 1/2 TABLETS(900 MG) BY MOUTH THREE TIMES DAILY 405 tablet 0  . lisinopril (PRINIVIL,ZESTRIL) 5 MG tablet TAKE 1 TABLET(5 MG) BY MOUTH DAILY 90 tablet 0  . morphine (MS CONTIN) 60 MG 12 hr tablet Take 1 tablet (60 mg total) by mouth 3 (three) times daily. 90 tablet 0  . nystatin ointment (MYCOSTATIN) Apply 1 application topically 2 (two) times daily. 30 g 0   No current facility-administered medications for this visit.     Allergies  Allergen Reactions  . Bee Venom Swelling  . Poison Ivy Extract [Poison Ivy Extract] Swelling    Family History  Problem Relation Age of Onset  . Cancer Mother        breast  . Alcohol abuse Father   . Heart disease Father   .  Heart disease Brother   . Hypertension Brother     Social History   Social History  . Marital status: Widowed    Spouse name: N/A  . Number of children: N/A  . Years of education: N/A   Occupational History  . Not on file.   Social History Main Topics  . Smoking status: Former Smoker    Quit date: 08/15/2011  . Smokeless tobacco: Never Used  . Alcohol use No     Comment: sober--since 2002  . Drug use: No  . Sexual activity: Not Currently   Other Topics Concern  . Not on file   Social History Narrative  . No narrative on file    Hospitiliaztions: None  Health Maintenance:    Flu: 04/2016  Tetanus: 01/2015  PSA: 04/2016  Colon Screening: 08/2013  Eye Doctor: yearly  Dental Exam: yearly   Providers:   PCP: Webb Silversmith, NP-C  Podiatry: Dr. Milinda Pointer  Pain Management: Dr. Chancy Milroy  I have personally reviewed and have noted:  1. The patient's medical and social history 2. Their use of alcohol, tobacco or illicit drugs 3. Their current medications and supplements 4. The patient's functional ability including ADL's, fall risks, home safety risks and hearing or visual impairment. 5. Diet and physical activities 6. Evidence for depression or mood disorder  Subjective:   Review of Systems:   Constitutional: Denies fever, malaise, fatigue, headache or abrupt weight changes.  HEENT: Denies eye pain, eye redness, ear pain, ringing in the ears, wax buildup, runny nose, nasal congestion, bloody nose, or sore throat. Respiratory: Denies difficulty breathing, shortness of breath, cough or sputum production.   Cardiovascular: Denies chest pain, chest tightness, palpitations or swelling in the hands or feet.  Gastrointestinal: Pt reports constipation. Denies abdominal pain, bloating, diarrhea or blood in the stool.  GU: Denies urgency, frequency, pain with urination, burning sensation, blood in urine, odor or discharge. Musculoskeletal: Pt reports chronic joint pain. Denies  decrease in range of motion, difficulty with gait, muscle pain or joint swelling.  Skin: Denies redness, rashes, lesions or ulcercations.  Neurological: Pt has problems with balance and coordination, insomnia. Denies dizziness, difficulty with memory, difficulty with speech Psych: Denies anxiety, depression, SI/HI.  No other specific complaints in a complete review of systems (except as listed in HPI above).  Objective:  PE:   BP 122/74   Pulse 92   Temp 98.1 F (36.7 C) (Oral)   Ht 5\' 6"  (1.676 m)   Wt 188 lb (85.3 kg)   SpO2 95%   BMI 30.34 kg/m   Wt Readings from Last 3 Encounters:  04/11/17 190 lb (86.2 kg)  04/29/16 184 lb 12 oz (83.8 kg)  10/26/15 172 lb 8 oz (78.2 kg)    General: Appears his stated age, chronically ill appearing, in NAD. Skin: Warm, dry and intact. HEENT: Head: normal shape and size; Eyes: sclera white, no icterus, conjunctiva pink, PERRLA and EOMs intact; Throat/Mouth: Teeth present, mucosa pink and moist, no exudate, lesions or ulcerations noted.  Neck: Neck supple, trachea midline. No masses, lumps or thyromegaly present.  Cardiovascular: Normal rate and rhythm. S1,S2 noted.  No murmur, rubs or gallops noted. No JVD or BLE edema. No carotid bruits noted. Pulmonary/Chest: Normal effort and positive vesicular breath sounds. No respiratory distress. No wheezes, rales or ronchi noted.  Abdomen: Soft and nontender. Normal bowel sounds. No distention or masses noted. Liver, spleen and kidneys non palpable. Musculoskeletal: Gait slow but steady with use of rolling walker. Neurological: Alert and oriented.  Psychiatric: Mood and affect normal. Behavior is normal. Judgment and thought content normal.     BMET    Component Value Date/Time   NA 139 04/29/2016 1506   K 4.0 04/29/2016 1506   CL 101 04/29/2016 1506   CO2 30 04/29/2016 1506   GLUCOSE 128 (H) 04/29/2016 1506   BUN 17 04/29/2016 1506   CREATININE 1.31 04/29/2016 1506   CALCIUM 9.2  04/29/2016 1506   GFRNONAA >60 01/31/2015 0503   GFRAA >60 01/31/2015 0503    Lipid Panel     Component Value Date/Time   CHOL 152 04/29/2016 1506   TRIG 109.0 04/29/2016 1506   HDL 33.50 (L) 04/29/2016 1506   CHOLHDL 5 04/29/2016 1506   VLDL 21.8 04/29/2016 1506   LDLCALC 97 04/29/2016 1506    CBC    Component Value Date/Time   WBC 7.6 04/29/2016 1506   RBC 4.85 04/29/2016 1506   HGB 14.7 04/29/2016 1506   HCT 43.8 04/29/2016 1506   PLT 226.0 04/29/2016 1506   MCV 90.3 04/29/2016 1506   MCH 30.5 01/27/2015 1118   MCHC 33.6 04/29/2016 1506   RDW 13.8 04/29/2016 1506   LYMPHSABS 1.4 01/27/2015 1118   MONOABS 1.0 01/27/2015 1118   EOSABS 0.0 01/27/2015 1118   BASOSABS 0.1 01/27/2015 1118  Hgb A1C No results found for: HGBA1C    Assessment and Plan:   Medicare Annual Wellness Visit:  Diet: Lactose Intolerant. He does eat meat. He consumes fruits and veggies daily. He does eat some fried foods. He drinks mostly water. Physical activity: None Depression/mood screen: Negative Hearing: Intact to whispered voice Visual acuity: Grossly normal, performs annual eye exam  ADLs: Capable Fall risk: None Home safety: Good Cognitive evaluation: Intact to orientation, naming, recall and repetition EOL planning: Adv directives, full code/ I agree  Preventative Medicine: Flu shot today. Tetanus UTD. PSA screening today. Colon screening UTD. Encouraged him to consume a balanced diet and exercise regimen. Advised him to see an eye doctor and dentist annually. Will check CBC, CMET, Lipid and PSA today.   Next appointment: 1 year, Medicare Wellness Exam   Webb Silversmith, NP

## 2017-05-01 NOTE — Assessment & Plan Note (Signed)
He is not interested in additional treatment for this at this time Will continue to monitor

## 2017-05-01 NOTE — Addendum Note (Signed)
Addended by: Lurlean Nanny on: 05/01/2017 02:51 PM   Modules accepted: Orders

## 2017-05-01 NOTE — Assessment & Plan Note (Signed)
Controlled on Lisinopril CMET today Will monitor 

## 2017-05-19 DIAGNOSIS — M5440 Lumbago with sciatica, unspecified side: Secondary | ICD-10-CM | POA: Diagnosis not present

## 2017-05-19 DIAGNOSIS — G8921 Chronic pain due to trauma: Secondary | ICD-10-CM | POA: Diagnosis not present

## 2017-05-19 DIAGNOSIS — G894 Chronic pain syndrome: Secondary | ICD-10-CM | POA: Diagnosis not present

## 2017-05-19 DIAGNOSIS — M542 Cervicalgia: Secondary | ICD-10-CM | POA: Diagnosis not present

## 2017-05-22 DIAGNOSIS — Z79899 Other long term (current) drug therapy: Secondary | ICD-10-CM | POA: Diagnosis not present

## 2017-05-26 ENCOUNTER — Ambulatory Visit: Payer: Self-pay

## 2017-05-26 NOTE — Telephone Encounter (Addendum)
Stated he has attempted to express fluid from lump on anterior neck within the past week, and initially able to express "clear fluid".  Now, stated the lump feels solid, and unable to express any fluid out.  Strongly encouraged to keep clean and dry, per Care Advice, and avoid doing any squeezing or expressing fluid from the site.  Appt. scheduled 10/30 @ 3:45 PM to evaluate lump on anterior neck.  Pt verb understanding of care advice.  Agrees with plan.    Reason for Disposition . Looks like a boil, infected sore, deep ulcer or other infected rash  Answer Assessment - Initial Assessment Questions 1. APPEARANCE of RASH: "Describe the rash."      Feels little bumps on neck near the larynx; feels solid when he touches it.  2. LOCATION: "Where is the rash located?"      Neck, underneathe my beard. 3. NUMBER: "How many spots are there?"      3 large and 2 small located close together 4. SIZE: "How big are the spots?" (Inches, centimeters or compare to size of a coin)      Approx. 1.5-2.0 inches in diameter 5. ONSET: "When did the rash start?"      Approx. 1 week ago 6. ITCHING: "Does the rash itch?" If so, ask: "How bad is the itch?"  (Scale 1-10; or mild, moderate, severe)     No itching 7. PAIN: "Does the rash hurt?" If so, ask: "How bad is the pain?"  (Scale 1-10; or mild, moderate, severe)     Hurts mild to moderate; it hurts only when I touch it or press on it.    8. OTHER SYMPTOMS: "Do you have any other symptoms?" (e.g., fever)     Denies fever or chills. 9. PREGNANCY: "Is there any chance you are pregnant?" "When was your last menstrual period?"     n/a  Protocols used: RASH OR REDNESS - LOCALIZED-A-AH

## 2017-05-27 ENCOUNTER — Encounter: Payer: Self-pay | Admitting: Family Medicine

## 2017-05-27 ENCOUNTER — Ambulatory Visit (INDEPENDENT_AMBULATORY_CARE_PROVIDER_SITE_OTHER): Payer: Medicare Other | Admitting: Family Medicine

## 2017-05-27 VITALS — BP 116/60 | HR 81 | Temp 97.9°F | Wt 188.0 lb

## 2017-05-27 DIAGNOSIS — L738 Other specified follicular disorders: Secondary | ICD-10-CM | POA: Diagnosis not present

## 2017-05-27 MED ORDER — DOXYCYCLINE HYCLATE 100 MG PO TABS: 100.0000 mg | ORAL_TABLET | Freq: Two times a day (BID) | ORAL | 0 refills | Status: DC

## 2017-05-27 NOTE — Progress Notes (Addendum)
BP 116/60 (BP Location: Left Arm, Patient Position: Sitting, Cuff Size: Normal)   Pulse 81   Temp 97.9 F (36.6 C) (Oral)   Wt 188 lb (85.3 kg)   SpO2 95%   BMI 30.34 kg/m    CC: mass on neck Subjective:    Patient ID: Samuel Burke, male    DOB: 1960-11-16, 56 y.o.   MRN: [id number]  HPI: Samuel Burke is a 56 y.o. male presenting on 05/27/2017 for Mass (located in left side of anterior neck. First looked like a pimple and pt tried to pop it. Now feels a nodule and another bump. Noticed 1 wk ago, a few days after flu shot. Has applied otc antibacrial, not helpful)   1 wk ago noticed bumps under his beard ?pimple. Tried to pop, clear fluid came out. New spots have come up as well. Treated with nystatin, neosporin, bacitracin.   Some throat discomfort from this.   No fevers/chills, nausea, no draining pus.   Chronically on 180mg  morphine for back pain.  Relevant past medical, surgical, family and social history reviewed and updated as indicated. Interim medical history since our last visit reviewed. Allergies and medications reviewed and updated. Outpatient Medications Prior to Visit  Medication Sig Dispense Refill  . baclofen (LIORESAL) 10 MG tablet TAKE 1 TABLET(10 MG) BY MOUTH TWICE DAILY 180 tablet 3  . Cholecalciferol (RA VITAMIN D-3) 2000 UNITS CAPS Take 1 capsule by mouth daily.     . Cholecalciferol (VITAMIN D3) 5000 units CAPS Take 1 capsule by mouth daily.    . diazepam (VALIUM) 5 MG tablet Take 2.5 mg by mouth daily.   2  . diclofenac (VOLTAREN) 50 MG EC tablet Take 1 tablet (50 mg total) by mouth 3 (three) times daily. 270 tablet 3  . docusate sodium (COLACE) 100 MG capsule Take 100 mg by mouth 2 (two) times daily.    Marland Kitchen gabapentin (NEURONTIN) 600 MG tablet TAKE 1 AND 1/2 TABLETS(900 MG) BY MOUTH THREE TIMES DAILY 405 tablet 3  . lisinopril (PRINIVIL,ZESTRIL) 5 MG tablet TAKE 1 TABLET(5 MG) BY MOUTH DAILY 90 tablet 3  . loratadine (CLARITIN) 10 MG tablet  Take 10 mg by mouth daily.    Marland Kitchen morphine (MS CONTIN) 60 MG 12 hr tablet Take 1 tablet (60 mg total) by mouth 3 (three) times daily. 90 tablet 0  . nystatin ointment (MYCOSTATIN) Apply 1 application topically 2 (two) times daily. 30 g 0   No facility-administered medications prior to visit.      Per HPI unless specifically indicated in ROS section below Review of Systems     Objective:    BP 116/60 (BP Location: Left Arm, Patient Position: Sitting, Cuff Size: Normal)   Pulse 81   Temp 97.9 F (36.6 C) (Oral)   Wt 188 lb (85.3 kg)   SpO2 95%   BMI 30.34 kg/m   Wt Readings from Last 3 Encounters:  05/27/17 188 lb (85.3 kg)  05/01/17 188 lb (85.3 kg)  04/11/17 190 lb (86.2 kg)    Physical Exam  Constitutional: He appears well-developed and well-nourished. No distress.  Lymphadenopathy:       Head (right side): No submental, no submandibular, no tonsillar, no preauricular and no posterior auricular adenopathy present.       Head (left side): No submental, no submandibular, no tonsillar, no preauricular and no posterior auricular adenopathy present.    He has no cervical adenopathy.  Skin: Rash noted. There is erythema.  L  neck below chin at beard line with few nodules and pustules without significant discharge  Nursing note and vitals reviewed.     Assessment & Plan:   Problem List Items Addressed This Visit    Folliculitis barbae - Primary    Anticipate folliculitis of beard - likely bacterial. Cover for this with doxy 7d course, also start clotrimazole for possible tinea. rec trimming beard, not shaving, and warm compresses. Update if not improving with treatment. Pt agrees with plan. See pt instructions for plan. Doubt pseudofolliculitis barbae.           Follow up plan: Return if symptoms worsen or fail to improve.  Ria Bush, MD

## 2017-05-27 NOTE — Patient Instructions (Signed)
I think you have folliculitis - either bacterial or fungal infection.  Treat with doxycycline course for 7 days. Also start using clotrimazole antifungal cream to beard line on left neck twice daily for 2 weeks.  Don't shave, rather trim beard.  Warm compresses to the area several times a day.

## 2017-05-27 NOTE — Assessment & Plan Note (Addendum)
Anticipate folliculitis of beard - likely bacterial. Cover for this with doxy 7d course, also start clotrimazole for possible tinea. rec trimming beard, not shaving, and warm compresses. Update if not improving with treatment. Pt agrees with plan. See pt instructions for plan. Doubt pseudofolliculitis barbae.

## 2017-06-10 DIAGNOSIS — M4802 Spinal stenosis, cervical region: Secondary | ICD-10-CM | POA: Insufficient documentation

## 2017-06-10 DIAGNOSIS — Z79899 Other long term (current) drug therapy: Secondary | ICD-10-CM | POA: Insufficient documentation

## 2017-06-10 DIAGNOSIS — M48061 Spinal stenosis, lumbar region without neurogenic claudication: Secondary | ICD-10-CM | POA: Insufficient documentation

## 2017-06-10 DIAGNOSIS — M47812 Spondylosis without myelopathy or radiculopathy, cervical region: Secondary | ICD-10-CM | POA: Insufficient documentation

## 2017-06-10 DIAGNOSIS — M159 Polyosteoarthritis, unspecified: Secondary | ICD-10-CM | POA: Insufficient documentation

## 2017-06-10 DIAGNOSIS — M7918 Myalgia, other site: Secondary | ICD-10-CM

## 2017-06-10 DIAGNOSIS — M150 Primary generalized (osteo)arthritis: Secondary | ICD-10-CM | POA: Insufficient documentation

## 2017-06-10 DIAGNOSIS — F1190 Opioid use, unspecified, uncomplicated: Secondary | ICD-10-CM | POA: Insufficient documentation

## 2017-06-10 DIAGNOSIS — S22080A Wedge compression fracture of T11-T12 vertebra, initial encounter for closed fracture: Secondary | ICD-10-CM | POA: Insufficient documentation

## 2017-06-10 DIAGNOSIS — Z79891 Long term (current) use of opiate analgesic: Secondary | ICD-10-CM | POA: Insufficient documentation

## 2017-06-10 DIAGNOSIS — M5442 Lumbago with sciatica, left side: Secondary | ICD-10-CM

## 2017-06-10 DIAGNOSIS — M899 Disorder of bone, unspecified: Secondary | ICD-10-CM | POA: Insufficient documentation

## 2017-06-10 DIAGNOSIS — Z789 Other specified health status: Secondary | ICD-10-CM | POA: Insufficient documentation

## 2017-06-10 DIAGNOSIS — M6281 Muscle weakness (generalized): Secondary | ICD-10-CM | POA: Insufficient documentation

## 2017-06-10 DIAGNOSIS — M5441 Lumbago with sciatica, right side: Secondary | ICD-10-CM

## 2017-06-10 DIAGNOSIS — M5030 Other cervical disc degeneration, unspecified cervical region: Secondary | ICD-10-CM | POA: Insufficient documentation

## 2017-06-10 DIAGNOSIS — S32040S Wedge compression fracture of fourth lumbar vertebra, sequela: Secondary | ICD-10-CM | POA: Insufficient documentation

## 2017-06-10 DIAGNOSIS — R296 Repeated falls: Secondary | ICD-10-CM | POA: Insufficient documentation

## 2017-06-10 DIAGNOSIS — M47816 Spondylosis without myelopathy or radiculopathy, lumbar region: Secondary | ICD-10-CM | POA: Insufficient documentation

## 2017-06-10 DIAGNOSIS — G8929 Other chronic pain: Secondary | ICD-10-CM | POA: Insufficient documentation

## 2017-06-10 NOTE — Progress Notes (Signed)
Patient's Name: Samuel Burke  MRN: [id number]  Referring Provider: Nathanial Rancher, MD  DOB: January 17, 1961  PCP: Jearld Fenton, NP  DOS: 06/11/2017  Note by: Gaspar Cola, MD  Service setting: Ambulatory outpatient  Specialty: Interventional Pain Management  Location: [facility] (AMB) Pain Management Facility  Visit type: Initial Patient Evaluation  Patient type: New Patient   Primary Reason(s) for Visit: Encounter for initial evaluation of one or more chronic problems (new to examiner) potentially causing chronic pain, and posing a threat to normal musculoskeletal function. (Level of risk: High) CC: Back Pain (complains of pain all over, mostly in back. )  HPI  Samuel Burke is a 56 y.o. year old, male patient, who comes today to see [place] for the first time for an initial evaluation of his chronic pain. He has Essential hypertension; Spinal stenosis of lumbar region without neurogenic claudication; Disease characterized by destruction of skeletal muscle; Insomnia; Folliculitis barbae; AKI (acute kidney injury) ([place]); Cholecystitis; Chronic pain syndrome; Health care maintenance; Chronic sore throat; Hoarseness or changing voice; Hyperkalemia; Sore throat and laryngitis; Swelling of left lower extremity; Neck pain; Cervicogenic headache (Fourth Area of Pain) (Bilateral) (R>L); Frequent falls; Muscle weakness of lower extremity; Chronic low back pain (Bilateral) (Midline) (R>L); Disorder of skeletal system; Pharmacologic therapy; Problems influencing health status; Chronic musculoskeletal pain; Osteoarthritis; Cervical facet arthropathy; Cervical foraminal stenosis (Multilevel) (Bilateral); DDD (degenerative disc disease), cervical; Compression fracture of T12 vertebra ([place]); Compression fracture of L4 lumbar vertebra, sequela; Lumbar facet arthropathy (Bilateral); Lumbar Lateral recess stenosis (Left) (L4-5); Long term prescription benzodiazepine use; Long term prescription opiate use; Long-term use of  high-risk medication; Opiate use (180 MME/day); Consciousness loss of ([place]); Chronic thoracic back pain (Midline) (Primary Area of Pain); Chronic upper back pain (Secondary Area of Pain) (Midline) (Bilateral) (L>R); Chronic foot pain (Tertiary Area of Pain) (Bilateral) (L>R); Occipital headache (Fourth Area of Pain) (Bilateral) (R>L); Chronic thigh pain (Fifth Area of Pain) (Bilateral) (R>L); Chronic groin pain (Fifth Area of Pain) (Bilateral) (R>L); Chronic upper extremity pain (Bilateral) (L>R); Chronic shoulder pain (Right); Chronic neck pain; DDD (degenerative disc disease), thoracic; DDD (degenerative disc disease), lumbar; and Chronic lower extremity pain (Bilateral) (L>R) on their problem list. Today he comes in for evaluation of his Back Pain (complains of pain all over, mostly in back. )  Pain Assessment: Location: Lower, Mid, Upper Back(From base of skull all the way down.  left shoulder ) Onset: More than a month ago Duration: Chronic pain Quality: Sharp, Constant, Numbness Severity: 10-Worst pain ever/10 (self-reported pain score)  Note: Reported level is inconsistent with clinical observations. Clinically the patient looks like a 4/10 A 4/10 is viewed as "Moderately Severe" and described as impossible to ignore for more than a few minutes. With effort, patients may still be able to manage work or participate in some social activities. Very difficult to concentrate. Signs of autonomic nervous system discharge are evident: dilated pupils (mydriasis); mild sweating (diaphoresis); sleep interference. Heart rate becomes elevated (>115 bpm). Diastolic blood pressure (lower number) rises above 100 mmHg. Patients find relief in laying down and not moving. Information on the proper use of the pain scale provided to the patient today. When using our objective Pain Scale, levels between 6 and 10/10 are said to belong in an emergency room, as it progressively worsens from a 6/10, described as severely  limiting, requiring emergency care not usually available at an outpatient pain management facility. At a 6/10 level, communication becomes difficult and requires great effort. Assistance to  reach the emergency department may be required. Facial flushing and profuse sweating along with potentially dangerous increases in heart rate and blood pressure will be evident. Effect on ADL: hurts when standing and walking Timing: Constant Modifying factors: medication, lying on back.   Onset and Duration: Sudden and Present longer than 3 months Cause of pain: Unknown Severity: Getting worse, NAS-11 at its worse: 10/10, NAS-11 at its best: 9/10, NAS-11 now: 10/10 and NAS-11 on the average: 9/10 Timing: Not influenced by the time of the day, During activity or exercise, After activity or exercise and After a period of immobility Aggravating Factors: Bending, Bowel movements, Kneeling, Lifiting, Motion, Prolonged sitting, Prolonged standing, Squatting, Stooping , Twisting, Walking, Walking uphill and Walking downhill Alleviating Factors: Lying down, Medications and being propped up at a certain angle Associated Problems: Constipation, Dizziness, Fatigue, Inability to control bladder (urine), Numbness, Spasms, Tingling, Weakness, Pain that wakes patient up and Pain that does not allow patient to sleep Quality of Pain: Burning, Constant, Cruel, Deep, Disabling, Exhausting, Getting longer, Horrible, Punishing, Sharp, Shooting, Stabbing, Tingling, Tiring, Toothache-like and Uncomfortable Previous Examinations or Tests: Bone scan, CT scan, Spinal tap, X-rays, Nerve conduction test, Neurological evaluation and Neurosurgical evaluation Previous Treatments: Narcotic medications  The patient comes into the clinics today for the first time for a chronic pain management evaluation.  The patient is referred to our clinic by Dr. Nathanial Rancher (Pain Management) for evaluation to determine if the patient is a good candidate for  an intrathecal morphine pump trial and implant.  According to the patient he has multiple areas of pain.  The primary pain is that of the mid (thoracic) back, in the midline, and does not radiate to either side.  The patient indicates that he has never had any surgery in the area but he does admit to having had some nerve blocks done around 2010 at the [facility] in [place].  He also indicates having had physical therapy around the same time, which did not help and in fact made things worse.  Indicates that the physical therapy consisted of 2 sessions per week for approximately 3 months.  The second area of pain is described to be that of the upper back in the midline with pain also being bilateral and worse on the left when compared to the right.  Again he denies any surgeries, nerve blocks, but he does indicate having had an MRI of the area.  He also indicates that he had the same physical therapy session treat that upper back pain as well.  The third area of pain and status of his feet, bilaterally, with the left being worse than the right.  He describes the pain in his feet to be over the top of the foot and what could be an L5 dermatomal distribution.  He denies any surgery in that area but does admit to having had an injury that caused a fracture of 3 different bones in each feet.  He denies any joint injections or nerve blocks in that area.  He denies any recent x-rays or nerve conduction test.  In addition, the patient indicates that the physical therapy that he had around 2010-2012 was meant to cover that area as well.  The fourth area of pain is that of the back of his head (occipital region) where he indicates having occipital headaches bilaterally with most of the pain and some numbness pain in the middle area.  Even though the pain is bilateral the right side  seems to be worse than the left.  He denies any surgeries, nerve blocks, x-rays, or physical therapy of that area.  The fifth  area of pain is that of his thighs and groin, bilaterally, with the right being worse than the left.  The patient denies any surgeries, nerve blocks, x-rays, or physical therapy for that particular pain.  The only physical therapy that he had was that there was for his back which he describes now that it took place more than 2 years ago and it did not help.  The next area of pain is described to be that of the upper extremities, bilaterally, with the left being worse than the right.  In the case of the left upper extremity the pain is in the forearm and it is associated with some weakness.  He denies any surgeries, nerve blocks, joint injections, x-rays, or physical therapy for that pain.  In the case of the right upper extremity the pain is primarily in the area of the shoulder.  He denies any surgeries, nerve blocks, joint injections, x-rays, or physical therapy for that shoulder pain.  The next area pain is described to be that of the lower back, bilaterally, with the pain being in the center of the back but radiating to both sides with the right being worse than the left.  He denies any back surgery, nerve blocks, joint injections, but does indicate that the same physical therapy and MRI/CT scans that were done for his back included the lower back.  So far the patient indicates that his treatment has consisted of pain medications which despite the fact that the pain is described to have started around August 2010, he indicates that he has been on pain medication for the past 3-4 years, nonstop.  He has been to several other pain clinics including the [facility] pain clinic, a pain clinic at [facility], and another pain clinic in [place] which he only went for 2 visits and describes that could not see eye-to-eye with the physician.  Currently his pain is being managed by Dr. Nathanial Rancher.  Areas of major concern include the fact that he says that he will intermittently lose consciousness and  that the triggers for this include prolonged standing, prolonged sitting, shopping, and traveling.  In terms of the predictability, he indicates that he has a sense of when he may be losing consciousness and laid down and abort the episode, but he cannot predict it with certainty.  The frequency is of about 4-5 episodes per week.  The duration is of approximately 4 hours.  The onset of this problem was around 2014.  And residual effects after he regained his consciousness include numbness and paralysis of his upper and lower extremities.  He indicates that he has never had this looked at by a neurologist.  Today I took the time to provide the patient with information regarding my pain practice.  At this point, my biggest concern is his recurrent loss of consciousness. Today I made it very clear in that I do not intend to proceed with any intrathecal trial or implant on until there is a clear explanation as to what is causing this. I am really concerned that he has never had this looked at by a neurologist. Today I took the necessary steps to refer him to a neurologist for full workup. In addition, I have ordered MRIs of his cervical, thoracic, and lumbar spine to evaluate the area before any type of surgery,  to determine if it's even possible to thread an intrathecal catheter into the affected area.  Historic Controlled Substance Pharmacotherapy Review  PMP and historical list of controlled substances: Morphine ER 60 mg; diazepam 5 mg; clonazepam 0.5 mg; Embeda ER 60-2 0.4 mg; oxycodone/APAP 10/325; morphine ER 15 mg; oxycodone 10 mg. Highest opioid analgesic regimen found: Morphine ER 60 mg twice a day + morphine ER 15 mg by mouth twice a day + oxycodone 10 mg 4 times a day (310 MME/day)  Most recent opioid analgesic: Morphine ER 60 mg by mouth 3 times a day (180 MME/day) + diazepam 5 mg every other day.  Current opioid analgesics: Morphine ER 60 mg by mouth 3 times a day (written on 05/19/2017 and failed  on 05/24/2017)  Highest recorded MME/day: 310 mg/day MME/day: 180 mg/day Medications: The patient did not bring the medication(s) to the appointment, as requested in our "New Patient Package" Pharmacodynamics: Desired effects: Analgesia: The patient reports >50% benefit. Reported improvement in function: The patient reports medication allows him to accomplish basic ADLs. Clinically meaningful improvement in function (CMIF): Sustained CMIF goals met Perceived effectiveness: Described as relatively effective, allowing for increase in activities of daily living (ADL) Undesirable effects: Side-effects or Adverse reactions: None reported Historical Monitoring: The patient  reports that he does not use drugs. List of all UDS Test(s): Lab Results  Component Value Date   MDMA NONE DETECTED 01/27/2015   COCAINSCRNUR NONE DETECTED 01/27/2015   PCPSCRNUR NONE DETECTED 01/27/2015   THCU NONE DETECTED 01/27/2015   List of other Serum/Urine Drug Screening Test(s):  Lab Results  Component Value Date   COCAINSCRNUR NONE DETECTED 01/27/2015   THCU NONE DETECTED 01/27/2015   Historical Background Evaluation: [place] PMP: Six (6) year initial data search conducted.             PMP NARX Score Report:  Narcotic: 451 Sedative: 311 Stimulant: 000 [place] Department of public safety, offender search: Editor, commissioning Information) Non-contributory Risk Assessment Profile: Aberrant behavior: None observed or detected today Risk factors for fatal opioid overdose: None identified today PMP NARX Overdose Risk Score: 210 Fatal overdose hazard ratio (HR): Calculation deferred Non-fatal overdose hazard ratio (HR): Calculation deferred Risk of opioid abuse or dependence: 0.7-3.0% with doses ? 36 MME/day and 6.1-26% with doses ? 120 MME/day. Substance use disorder (SUD) risk level: Pending results of Medical Psychology Evaluation for SUD Opioid risk tool (ORT) (Total Score): 6 Opioid Risk Tool - 06/11/17 1104      Family  History of Substance Abuse   Alcohol  Positive Male    Illegal Drugs  Negative    Rx Drugs  Negative      Personal History of Substance Abuse   Alcohol  Positive   Illegal Drugs  Negative    Rx Drugs  Negative      Age   Age between 35-45 years   No      History of Preadolescent Sexual Abuse   History of Preadolescent Sexual Abuse  Negative or Male      Psychological Disease   Psychological Disease  Negative    Depression  Negative      Total Score   Opioid Risk Tool Scoring  6    Opioid Risk Interpretation  Moderate Risk      ORT Scoring interpretation table:  Score <3 = Low Risk for SUD  Score between 4-7 = Moderate Risk for SUD  Score >8 = High Risk for Opioid Abuse   PHQ-2 Depression Scale:  Total score: 0  PHQ-2 Scoring interpretation table: (Score and probability of major depressive disorder)  Score 0 = No depression  Score 1 = 15.4% Probability  Score 2 = 21.1% Probability  Score 3 = 38.4% Probability  Score 4 = 45.5% Probability  Score 5 = 56.4% Probability  Score 6 = 78.6% Probability   PHQ-9 Depression Scale:  Total score: 0  PHQ-9 Scoring interpretation table:  Score 0-4 = No depression  Score 5-9 = Mild depression  Score 10-14 = Moderate depression  Score 15-19 = Moderately severe depression  Score 20-27 = Severe depression (2.4 times higher risk of SUD and 2.89 times higher risk of overuse)   Pharmacologic Plan: I do not intend to take over this patient's medication management.            Initial impression: The patient does appear to have the appropriate indications for the use of opioid analgesics.  However, it is also very clear that over time he has developed tolerance to the medication, which has not been addressed by using any type of "Drug Holiday".  According to the patient's own account he thinks that he has been on these medications, nonstop, for the past 3-4 years.  However, according to a search conducted on the PMP system the patient has  used opioids dating back as far as 06/19/2011.  The patient did indicate that he had moved and therefore there is the possibility that between 2012 and 2016 records may not be available to [place].  However from 07/14/2014, all the way down to the present day, he has been getting high-dose opioid analgesics on a monthly basis, as well as some benzodiazepines.  Currently his MME is 180, but this has been much higher in the past.  Meds   Current Outpatient Medications:  .  baclofen (LIORESAL) 10 MG tablet, TAKE 1 TABLET(10 MG) BY MOUTH TWICE DAILY, Disp: 180 tablet, Rfl: 3 .  Cholecalciferol (RA VITAMIN D-3) 2000 UNITS CAPS, Take 1 capsule by mouth daily. , Disp: , Rfl:  .  Cholecalciferol (VITAMIN D3) 5000 units CAPS, Take 1 capsule by mouth daily., Disp: , Rfl:  .  diazepam (VALIUM) 5 MG tablet, Take 2.5 mg by mouth daily. , Disp: , Rfl: 2 .  diclofenac (VOLTAREN) 50 MG EC tablet, Take 1 tablet (50 mg total) by mouth 3 (three) times daily., Disp: 270 tablet, Rfl: 3 .  docusate sodium (COLACE) 100 MG capsule, Take 100 mg by mouth 2 (two) times daily., Disp: , Rfl:  .  gabapentin (NEURONTIN) 600 MG tablet, TAKE 1 AND 1/2 TABLETS(900 MG) BY MOUTH THREE TIMES DAILY, Disp: 405 tablet, Rfl: 3 .  lisinopril (PRINIVIL,ZESTRIL) 5 MG tablet, TAKE 1 TABLET(5 MG) BY MOUTH DAILY, Disp: 90 tablet, Rfl: 3 .  loratadine (CLARITIN) 10 MG tablet, Take 10 mg by mouth daily., Disp: , Rfl:  .  morphine (MS CONTIN) 60 MG 12 hr tablet, Take 1 tablet (60 mg total) by mouth 3 (three) times daily., Disp: 90 tablet, Rfl: 0 .  nystatin ointment (MYCOSTATIN), Apply 1 application topically 2 (two) times daily., Disp: 30 g, Rfl: 0  Imaging Review  Cervical Imaging: Cervical CT wo contrast:  Results for orders placed during the hospital encounter of 01/27/15  CT Cervical Spine Wo Contrast   Narrative CLINICAL DATA:  Fall last night, bilateral weakness.  EXAM: CT HEAD WITHOUT CONTRAST  CT CERVICAL SPINE WITHOUT  CONTRAST  TECHNIQUE: Multidetector CT imaging of the head and cervical spine was performed  following the standard protocol without intravenous contrast. Multiplanar CT image reconstructions of the cervical spine were also generated.  COMPARISON:  None.  FINDINGS: CT HEAD FINDINGS  Soft tissue swelling over the right forehead. No acute intracranial abnormality. Specifically, no hemorrhage, hydrocephalus, mass lesion, acute infarction, or significant intracranial injury. No acute calvarial abnormality. Visualized paranasal sinuses and mastoids clear. Orbital soft tissues unremarkable.  CT CERVICAL SPINE FINDINGS  Mild degenerative disc disease diffusely throughout the cervical spine with disc space narrowing and spurring. Mild degenerative facet disease bilaterally. Normal alignment. Prevertebral soft tissues are normal.  Mild bilateral multi level neural foraminal narrowing due to uncovertebral spurring. No fracture. No epidural or paraspinal hematoma. Visualized lung apices clear.  IMPRESSION: No acute intracranial abnormality.  Mild degenerative disc and facet disease throughout the cervical spine with mild bilateral multi level neural foraminal narrowing. No acute bony abnormality.   Electronically Signed   By: Rolm Baptise M.D.   On: 01/27/2015 12:04    Thoracic Imaging: Thoracic MR wo contrast:  Results for orders placed during the hospital encounter of 01/27/15  MR Thoracic Spine Wo Contrast   Narrative CLINICAL DATA:  Severe low back pain. Multiple falls. Weakness in both legs.  EXAM: MRI THORACIC SPINE WITHOUT CONTRAST  TECHNIQUE: Multiplanar, multisequence MR imaging of the thoracic spine was performed. No intravenous contrast was administered.  COMPARISON:  Radiographs dated 01/27/2015  FINDINGS: There is slight hypertrophy of the ligamentum flavum in the midline at T3-4 and T4-5 with no neural impingement.  Tiny central disc protrusion at T9-10  which touches the ventral aspect of the spinal cord but there is no myelopathy.  Hypertrophy of the ligamentum flavum to the right and left at T10-11. Old slight deformity of the superior endplate of E94.  Small subacute or old compression fracture of the anterior superior aspect of T12. It is no disc protrusion.  T12-L1 and L1-2 are normal.  The thoracic spinal cord is normal. Paraspinal soft tissues are normal.  IMPRESSION: 1. Tiny subacute or old compression fracture of the anterior superior aspect of T12 with no neural impingement. 2. Old deformity of the superior endplate of M76 with no neural impingement. 3. Small central disc protrusion at T9-10 with no neural impingement.   Electronically Signed   By: Lorriane Shire M.D.   On: 01/27/2015 16:15    Thoracic DG 2-3 views:  Results for orders placed during the hospital encounter of 01/27/15  DG Thoracic Spine 2 View   Narrative CLINICAL DATA:  Fall.  Lower extremity weakness.  EXAM: THORACIC SPINE - 2-3 VIEWS  COMPARISON:  None.  FINDINGS: There is mild depression of the upper endplate of K08. No other evidence of a fracture. No spondylolisthesis. There are minor disc degenerative changes reflected by mild loss of disc height and small endplate osteophytes mostly along the mid thoracic spine.  Soft tissues are unremarkable.  IMPRESSION: 1. Mild depression of the upper endplates of U11 which may reflect a fracture. This could be recent or remote. 2. No other evidence of a fracture.   Electronically Signed   By: Lajean Manes M.D.   On: 01/27/2015 13:35    Lumbosacral Imaging: Lumbar MR wo contrast:  Results for orders placed during the hospital encounter of 01/27/15  MR Lumbar Spine Wo Contrast   Narrative CLINICAL DATA:  Multiple falls last night. Back pain with increased bilateral lower extremity weakness.  EXAM: MRI LUMBAR SPINE WITHOUT CONTRAST  TECHNIQUE: Multiplanar, multisequence MR  imaging of the lumbar  spine was performed. No intravenous contrast was administered.  COMPARISON:  Radiographs dated 01/27/2015  FINDINGS: There is an acute subtle compression fracture of the superior endplate of L4 asymmetric to the left. The fracture does not involve the posterior margin of the vertebral body. There is no bone or disc protrusion into the spinal canal at that level.  Normal conus tip at L1-2.  T12-L1:  Normal.  L1-2:  Tiny broad-based disc bulge with no neural impingement.  L2-3:  Tiny broad-based disc bulge with no neural impingement.  L3-4 disc:  Normal disc.  Slight bilateral facet arthritis.  L4-5: Slight broad-based disc bulge with slight narrowing of the left lateral recess which could affect the left L5 nerve.  L5-S1:  Small broad-based disc bulge with no neural impingement.  IMPRESSION: 1. Acute benign appearing compression fracture of the of the superior endplate of L4 asymmetric to the left. No neural impingement at that level. 2. Left lateral recess compression at L4-5 which could affect the left L5 nerve.   Electronically Signed   By: Lorriane Shire M.D.   On: 01/27/2015 16:35    Lumbar DG (Complete) 4+V:  Results for orders placed during the hospital encounter of 01/27/15  DG Lumbar Spine Complete   Narrative CLINICAL DATA:  Fall.  Lower extremity weakness.  EXAM: LUMBAR SPINE - COMPLETE 4+ VIEW  COMPARISON:  None.  FINDINGS: No fracture of the lumbar spine. No spondylolisthesis. Mild loss of disc height from the lower thoracic spine through L4-L5. There are small endplate osteophytes.  Soft tissues are unremarkable.  IMPRESSION: No fracture or acute finding.   Electronically Signed   By: Lajean Manes M.D.   On: 01/27/2015 13:36    Complexity Note: Imaging results reviewed. Results shared with Samuel Burke, using Layman's terms.                         ROS  Cardiovascular History: High blood pressure Pulmonary or  Respiratory History: No reported pulmonary signs or symptoms such as wheezing and difficulty taking a deep full breath (Asthma), difficulty blowing air out (Emphysema), coughing up mucus (Bronchitis), persistent dry cough, or temporary stoppage of breathing during sleep Neurological History: No reported neurological signs or symptoms such as seizures, abnormal skin sensations, urinary and/or fecal incontinence, being born with an abnormal open spine and/or a tethered spinal cord Review of Past Neurological Studies:  Results for orders placed or performed during the hospital encounter of 01/27/15  CT Head Wo Contrast   Narrative   CLINICAL DATA:  Fall last night, bilateral weakness.  EXAM: CT HEAD WITHOUT CONTRAST  CT CERVICAL SPINE WITHOUT CONTRAST  TECHNIQUE: Multidetector CT imaging of the head and cervical spine was performed following the standard protocol without intravenous contrast. Multiplanar CT image reconstructions of the cervical spine were also generated.  COMPARISON:  None.  FINDINGS: CT HEAD FINDINGS  Soft tissue swelling over the right forehead. No acute intracranial abnormality. Specifically, no hemorrhage, hydrocephalus, mass lesion, acute infarction, or significant intracranial injury. No acute calvarial abnormality. Visualized paranasal sinuses and mastoids clear. Orbital soft tissues unremarkable.  CT CERVICAL SPINE FINDINGS  Mild degenerative disc disease diffusely throughout the cervical spine with disc space narrowing and spurring. Mild degenerative facet disease bilaterally. Normal alignment. Prevertebral soft tissues are normal.  Mild bilateral multi level neural foraminal narrowing due to uncovertebral spurring. No fracture. No epidural or paraspinal hematoma. Visualized lung apices clear.  IMPRESSION: No acute intracranial abnormality.  Mild degenerative  disc and facet disease throughout the cervical spine with mild bilateral multi level neural  foraminal narrowing. No acute bony abnormality.   Electronically Signed   By: Rolm Baptise M.D.   On: 01/27/2015 12:04    Psychological-Psychiatric History: No reported psychological or psychiatric signs or symptoms such as difficulty sleeping, anxiety, depression, delusions or hallucinations (schizophrenial), mood swings (bipolar disorders) or suicidal ideations or attempts Gastrointestinal History: Alternating episodes iof diarrhea and constipation (IBS-Irritable bowe syndrome) Genitourinary History: No reported renal or genitourinary signs or symptoms such as difficulty voiding or producing urine, peeing blood, non-functioning kidney, kidney stones, difficulty emptying the bladder, difficulty controlling the flow of urine, or chronic kidney disease Hematological History: No reported hematological signs or symptoms such as prolonged bleeding, low or poor functioning platelets, bruising or bleeding easily, hereditary bleeding problems, low energy levels due to low hemoglobin or being anemic Endocrine History: No reported endocrine signs or symptoms such as high or low blood sugar, rapid heart rate due to high thyroid levels, obesity or weight gain due to slow thyroid or thyroid disease Rheumatologic History: No reported rheumatological signs and symptoms such as fatigue, joint pain, tenderness, swelling, redness, heat, stiffness, decreased range of motion, with or without associated rash Musculoskeletal History: Negative for myasthenia gravis, muscular dystrophy, multiple sclerosis or malignant hyperthermia Work History: Disabled  Allergies  Samuel Burke is allergic to bee venom and poison ivy extract [poison ivy extract].  Laboratory Chemistry  Inflammation Markers (CRP: Acute Phase) (ESR: Chronic Phase) Lab Results  Component Value Date   ESRSEDRATE 17 01/27/2015                 Renal Function Markers Lab Results  Component Value Date   BUN 24 (H) 05/01/2017   CREATININE 1.35  05/01/2017   GFRAA >60 01/31/2015   GFRNONAA >60 01/31/2015                 Hepatic Function Markers Lab Results  Component Value Date   AST 20 05/01/2017   ALT 35 05/01/2017   ALBUMIN 4.2 05/01/2017   ALKPHOS 65 05/01/2017   HCVAB NEGATIVE 04/29/2016                 Electrolytes Lab Results  Component Value Date   NA 138 05/01/2017   K 4.2 05/01/2017   CL 101 05/01/2017   CALCIUM 9.4 05/01/2017                 Neuropathy Markers No results found for: [id number]               Bone Pathology Markers Lab Results  Component Value Date   ALKPHOS 65 05/01/2017   CALCIUM 9.4 05/01/2017                 Rheumatology Markers No results found for: [facility]              Coagulation Parameters Lab Results  Component Value Date   PLT 221.0 05/01/2017                 Cardiovascular Markers Lab Results  Component Value Date   CKTOTAL 1,466 (H) 01/29/2015   HGB 14.0 05/01/2017   HCT 42.5 05/01/2017                 CA Markers No results found for: CEA, CA125, LABCA2               Note: Lab results reviewed.  PFSH  Drug:  Samuel Burke  reports that he does not use drugs. Alcohol:  reports that he does not drink alcohol. Tobacco:  reports that he quit smoking about 5 years ago. he has never used smokeless tobacco. Medical:  has a past medical history of Frequent headaches, History of colon polyps, Hypertension, and Spinal stenosis. Family: family history includes Alcohol abuse in his father; Cancer in his mother; Heart disease in his brother and father; Hypertension in his brother.  Past Surgical History:  Procedure Laterality Date  . CHOLECYSTECTOMY  2015   Active Ambulatory Problems    Diagnosis Date Noted  . Essential hypertension 10/20/2014  . Spinal stenosis of lumbar region without neurogenic claudication 10/20/2014  . Disease characterized by destruction of skeletal muscle 01/27/2015  . Insomnia 10/27/2015  . Folliculitis barbae 96/75/9163  . AKI (acute  kidney injury) ([place]) 01/20/2013  . Cholecystitis 12/23/2013  . Chronic pain syndrome 04/16/2012  . Health care maintenance 01/19/2013  . Chronic sore throat 10/01/2013  . Hoarseness or changing voice 10/01/2013  . Hyperkalemia 01/20/2013  . Sore throat and laryngitis 08/26/2013  . Swelling of left lower extremity 02/22/2013  . Neck pain 10/27/2013  . Cervicogenic headache (Fourth Area of Pain) (Bilateral) (R>L) 10/20/2014  . Frequent falls 06/10/2017  . Muscle weakness of lower extremity 06/10/2017  . Chronic low back pain (Bilateral) (Midline) (R>L) 06/10/2017  . Disorder of skeletal system 06/10/2017  . Pharmacologic therapy 06/10/2017  . Problems influencing health status 06/10/2017  . Chronic musculoskeletal pain 06/10/2017  . Osteoarthritis 06/10/2017  . Cervical facet arthropathy 06/10/2017  . Cervical foraminal stenosis (Multilevel) (Bilateral) 06/10/2017  . DDD (degenerative disc disease), cervical 06/10/2017  . Compression fracture of T12 vertebra ([place]) 06/10/2017  . Compression fracture of L4 lumbar vertebra, sequela 06/10/2017  . Lumbar facet arthropathy (Bilateral) 06/10/2017  . Lumbar Lateral recess stenosis (Left) (L4-5) 06/10/2017  . Long term prescription benzodiazepine use 06/10/2017  . Long term prescription opiate use 06/10/2017  . Long-term use of high-risk medication 06/10/2017  . Opiate use (180 MME/day) 06/10/2017  . Consciousness loss of ([place]) 06/11/2017  . Chronic thoracic back pain (Midline) (Primary Area of Pain) 06/11/2017  . Chronic upper back pain (Secondary Area of Pain) (Midline) (Bilateral) (L>R) 06/11/2017  . Chronic foot pain [facility] [place] Area of Pain) (Bilateral) (L>R) 06/11/2017  . Occipital headache (Fourth Area of Pain) (Bilateral) (R>L) 06/11/2017  . Chronic thigh pain (Fifth Area of Pain) (Bilateral) (R>L) 06/11/2017  . Chronic groin pain (Fifth Area of Pain) (Bilateral) (R>L) 06/11/2017  . Chronic upper extremity pain (Bilateral) (L>R)  06/11/2017  . Chronic shoulder pain (Right) 06/11/2017  . Chronic neck pain 06/11/2017  . DDD (degenerative disc disease), thoracic 06/11/2017  . DDD (degenerative disc disease), lumbar 06/11/2017  . Chronic lower extremity pain (Bilateral) (L>R) 06/11/2017   Resolved Ambulatory Problems    Diagnosis Date Noted  . No Resolved Ambulatory Problems   Past Medical History:  Diagnosis Date  . Frequent headaches   . History of colon polyps   . Hypertension   . Spinal stenosis    Constitutional Exam  General appearance: Well nourished, well developed, and well hydrated. In no apparent acute distress Vitals:   06/11/17 1056  BP: (!) 152/91  Pulse: 88  Resp: 18  Temp: 97.8 F (36.6 C)  SpO2: 97%  Weight: 181 lb (82.1 kg)  Height: 5' 6" (1.676 m)   BMI Assessment: Estimated body mass index is 29.21 kg/m as calculated from the following:   Height as of  this encounter: 5' 6" (1.676 m).   Weight as of this encounter: 181 lb (82.1 kg).  BMI interpretation table: BMI level Category Range association with higher incidence of chronic pain  <18 kg/m2 Underweight   18.5-24.9 kg/m2 Ideal body weight   25-29.9 kg/m2 Overweight Increased incidence by 20%  30-34.9 kg/m2 Obese (Class I) Increased incidence by 68%  35-39.9 kg/m2 Severe obesity (Class II) Increased incidence by 136%  >40 kg/m2 Extreme obesity (Class III) Increased incidence by 254%   BMI Readings from Last 4 Encounters:  06/11/17 29.21 kg/m  05/27/17 30.34 kg/m  05/01/17 30.34 kg/m  04/11/17 30.67 kg/m   Wt Readings from Last 4 Encounters:  06/11/17 181 lb (82.1 kg)  05/27/17 188 lb (85.3 kg)  05/01/17 188 lb (85.3 kg)  04/11/17 190 lb (86.2 kg)  Psych/Mental status: Alert, oriented x 3 (person, place, & time)       Eyes: PERLA Respiratory: No evidence of acute respiratory distress  Cervical Spine Area Exam  Skin & Axial Inspection: No masses, redness, edema, swelling, or associated skin lesions Alignment:  Symmetrical Functional ROM: Decreased ROM      Stability: No instability detected Muscle Tone/Strength: Functionally intact. No obvious neuro-muscular anomalies detected. Sensory (Neurological): Movement-associated discomfort Palpation: Complains of area being tender to palpation              Upper Extremity (UE) Exam    Side: Right upper extremity  Side: Left upper extremity  Skin & Extremity Inspection: Skin color, temperature, and hair growth are WNL. No peripheral edema or cyanosis. No masses, redness, swelling, asymmetry, or associated skin lesions. No contractures.  Skin & Extremity Inspection: Skin color, temperature, and hair growth are WNL. No peripheral edema or cyanosis. No masses, redness, swelling, asymmetry, or associated skin lesions. No contractures.  Functional ROM: Decreased ROM for all joints of upper extremity  Functional ROM: Decreased ROM for all joints of upper extremity  Muscle Tone/Strength: Generalized upper extremity weakness  Muscle Tone/Strength: Generalized upper extremity weakness  Sensory (Neurological): Unimpaired          Sensory (Neurological): Unimpaired          Palpation: No palpable anomalies              Palpation: No palpable anomalies              Specialized Test(s): Deferred         Specialized Test(s): Deferred          Thoracic Spine Area Exam  Skin & Axial Inspection: No masses, redness, or swelling Alignment: Symmetrical Functional ROM: Unrestricted ROM Stability: No instability detected Muscle Tone/Strength: Functionally intact. No obvious neuro-muscular anomalies detected. Sensory (Neurological): Unimpaired Muscle strength & Tone: No palpable anomalies  Lumbar Spine Area Exam  Skin & Axial Inspection: No masses, redness, or swelling Alignment: Symmetrical Functional ROM: Unrestricted ROM      Stability: No instability detected Muscle Tone/Strength: Functionally intact. No obvious neuro-muscular anomalies detected. Sensory  (Neurological): Unimpaired Palpation: No palpable anomalies       Provocative Tests: Lumbar Hyperextension and rotation test: evaluation deferred today       Lumbar Lateral bending test: evaluation deferred today       Patrick's Maneuver: evaluation deferred today                    Gait & Posture Assessment  Ambulation: Patient ambulates using a wheel chair Gait: Limited. Using assistive device to ambulate Posture: Difficulty standing up straight,  due to pain   Lower Extremity Exam    Side: Right lower extremity  Side: Left lower extremity  Skin & Extremity Inspection: Skin color, temperature, and hair growth are WNL. No peripheral edema or cyanosis. No masses, redness, swelling, asymmetry, or associated skin lesions. No contractures.  Skin & Extremity Inspection: Skin color, temperature, and hair growth are WNL. No peripheral edema or cyanosis. No masses, redness, swelling, asymmetry, or associated skin lesions. No contractures.  Functional ROM: Decreased ROM for all joints of the lower extremity.  He stands up with hips and knees in flexion  Functional ROM: Decreased ROM for all joints of the lower extremity. He stands up with hips and knees in flexion  Muscle Tone/Strength: Functionally intact. No obvious neuro-muscular anomalies detected.  Muscle Tone/Strength: Functionally intact. No obvious neuro-muscular anomalies detected.  Sensory (Neurological): Unimpaired  Sensory (Neurological): Unimpaired  Palpation: No palpable anomalies  Palpation: No palpable anomalies   Assessment  Primary Diagnosis & Pertinent Problem List: The primary encounter diagnosis was Chronic pain syndrome. Diagnoses of Chronic midline back pain (Primary Area of Pain), DDD (degenerative disc disease), thoracic, Compression fracture of T12 vertebra ([place]), Other intervertebral disc degeneration, thoracic region, Chronic upper back pain (Secondary Area of Pain) (Midline) (Bilateral) (L>R), Cervical foraminal stenosis  (Bilateral), Cervical facet arthropathy, DDD (degenerative disc disease), cervical, Cervicalgia, Chronic foot pain (Tertiary Area of Pain) (Bilateral) (L>R), Occipital headache (Fourth Area of Pain) (Bilateral) (R>L), Cervicogenic headache (Fourth Area of Pain) (Bilateral) (R>L), Consciousness loss of (HCC), Chronic upper extremity pain (Bilateral) (L>R), Chronic shoulder pain (Right), Chronic bilateral low back pain with bilateral sciatica, DDD (degenerative disc disease), lumbar, Other intervertebral disc degeneration, lumbar region, Lumbar facet arthropathy (Bilateral), Compression fracture of L4 lumbar vertebra, sequela, Lumbar Lateral recess stenosis (Left) (L4-5), Chronic lower extremity pain (Bilateral) (L>R), Chronic thigh pain (Fifth Area of Pain) (Bilateral) (R>L), Chronic groin pain (Fifth Area of Pain) (Bilateral) (R>L), Muscle weakness of lower extremity, Frequent falls, Chronic musculoskeletal pain, Disorder of skeletal system, Osteoarthritis, Problems influencing health status, Pharmacologic therapy, Long term prescription benzodiazepine use, Long term prescription opiate use, Long-term use of high-risk medication, and Opiate use (180 MME/day) were also pertinent to this visit.  Visit Diagnosis (New problems to examiner): 1. Chronic pain syndrome   2. Chronic midline back pain (Primary Area of Pain)   3. DDD (degenerative disc disease), thoracic   4. Compression fracture of T12 vertebra (HCC)   5. Other intervertebral disc degeneration, thoracic region   6. Chronic upper back pain (Secondary Area of Pain) (Midline) (Bilateral) (L>R)   7. Cervical foraminal stenosis (Bilateral)   8. Cervical facet arthropathy   9. DDD (degenerative disc disease), cervical   10. Cervicalgia   11. Chronic foot pain (Tertiary Area of Pain) (Bilateral) (L>R)   12. Occipital headache (Fourth Area of Pain) (Bilateral) (R>L)   13. Cervicogenic headache (Fourth Area of Pain) (Bilateral) (R>L)   14.  Consciousness loss of ([place])   15. Chronic upper extremity pain (Bilateral) (L>R)   16. Chronic shoulder pain (Right)   17. Chronic bilateral low back pain with bilateral sciatica   18. DDD (degenerative disc disease), lumbar   19. Other intervertebral disc degeneration, lumbar region   20. Lumbar facet arthropathy (Bilateral)   21. Compression fracture of L4 lumbar vertebra, sequela   22. Lumbar Lateral recess stenosis (Left) (L4-5)   23. Chronic lower extremity pain (Bilateral) (L>R)   24. Chronic thigh pain (Fifth Area of Pain) (Bilateral) (R>L)   25. Chronic groin  pain (Fifth Area of Pain) (Bilateral) (R>L)   26. Muscle weakness of lower extremity   27. Frequent falls   28. Chronic musculoskeletal pain   29. Disorder of skeletal system   30. Osteoarthritis   31. Problems influencing health status   32. Pharmacologic therapy   33. Long term prescription benzodiazepine use   34. Long term prescription opiate use   35. Long-term use of high-risk medication   36. Opiate use (180 MME/day)    Plan of Care (Initial workup plan)  Note: Samuel Burke was reminded that as per protocol, today's visit has been an evaluation only. We have not taken over the patient's controlled substance management.  Problem-specific plan: No problem-specific Assessment & Plan notes found for this encounter.   Lab Orders     Compliance Drug Analysis, Ur     Comp. Metabolic Panel (12)     Magnesium     Vitamin B12     Sedimentation rate     25-Hydroxyvitamin D Lcms D2+D3     C-reactive protein  Imaging Orders     DG Lumbar Spine Complete W/Bend     DG Cervical Spine Complete     DG Thoracic Spine 2 View     MR CERVICAL SPINE WO CONTRAST     MR LUMBAR SPINE WO CONTRAST     MR THORACIC SPINE WO CONTRAST  Referral Orders     Ambulatory referral to Psychology: For substance use disorder evaluation.     Ambulatory referral to Neurology: To evaluate this patient has history of loss of consciousness.      Ambulatory referral to Psychology: For implant evaluation (MMPI 2). Procedure Orders    No procedure(s) ordered today   Pharmacotherapy (current): Medications ordered:  No orders of the defined types were placed in this encounter.  Medications administered during this visit: Clemetine Marker had no medications administered during this visit.   Pharmacological management options:  Opioid Analgesics: This referral is a consult only and we do not intend to take over this patient has opioid analgesics.  Membrane stabilizer: I will not be taking over Samuel Burke medication regimen  Muscle relaxant: I will not be taking over Samuel Burke medication regimen  NSAID: I will not be taking over Samuel Burke medication regimen  Other analgesic(s): I will not be taking over Samuel Burke medication regimen   Interventional management options: Samuel Burke was informed that there is no guarantee that he would be a candidate for interventional therapies. The decision will be based on the results of diagnostic studies, as well as Samuel Burke's risk profile.  Procedure(s) to consider:  Diagnostic intrathecal morphine pump trial  Possible intrathecal pump implant  Diagnostic left L4-5 lumbar epidural steroid injection  Diagnostic bilateral lumbar facet block  Possible bilateral lumbar facet RFA  Diagnostic bilateral cervical facet block  Possible bilateral cervical facet RFA  Diagnostic thoracic epidural steroid injection    Provider-requested follow-up: Return for 2nd Visit w/ Dr. Dossie Arbour after [facility] eval.  Future Appointments  Date Time Provider [place]  05/04/2018  2:30 PM Jearld Fenton, NP [facility]    Primary Care Physician: Jearld Fenton, NP Location: [facility] Note by: Gaspar Cola, MD Date: 06/11/2017; Time: 4:39 PM

## 2017-06-11 ENCOUNTER — Ambulatory Visit: Payer: Medicare Other | Attending: Pain Medicine | Admitting: Pain Medicine

## 2017-06-11 ENCOUNTER — Encounter: Payer: Self-pay | Admitting: Pain Medicine

## 2017-06-11 ENCOUNTER — Other Ambulatory Visit: Payer: Self-pay

## 2017-06-11 ENCOUNTER — Ambulatory Visit
Admission: RE | Admit: 2017-06-11 | Discharge: 2017-06-11 | Disposition: A | Discharge status: Home / Self Care | Payer: Medicare Other | Source: Ambulatory Visit | Attending: Pain Medicine | Admitting: Pain Medicine

## 2017-06-11 VITALS — BP 152/91 | HR 88 | Temp 97.8°F | Resp 18 | Ht 66.0 in | Wt 181.0 lb

## 2017-06-11 DIAGNOSIS — Z91048 Other nonmedicinal substance allergy status: Secondary | ICD-10-CM | POA: Diagnosis not present

## 2017-06-11 DIAGNOSIS — M9981 Other biomechanical lesions of cervical region: Secondary | ICD-10-CM | POA: Diagnosis not present

## 2017-06-11 DIAGNOSIS — M51369 Other intervertebral disc degeneration, lumbar region without mention of lumbar back pain or lower extremity pain: Secondary | ICD-10-CM

## 2017-06-11 DIAGNOSIS — E875 Hyperkalemia: Secondary | ICD-10-CM | POA: Insufficient documentation

## 2017-06-11 DIAGNOSIS — M47812 Spondylosis without myelopathy or radiculopathy, cervical region: Secondary | ICD-10-CM

## 2017-06-11 DIAGNOSIS — M47816 Spondylosis without myelopathy or radiculopathy, lumbar region: Secondary | ICD-10-CM | POA: Insufficient documentation

## 2017-06-11 DIAGNOSIS — I129 Hypertensive chronic kidney disease with stage 1 through stage 4 chronic kidney disease, or unspecified chronic kidney disease: Secondary | ICD-10-CM | POA: Diagnosis not present

## 2017-06-11 DIAGNOSIS — S22080A Wedge compression fracture of T11-T12 vertebra, initial encounter for closed fracture: Secondary | ICD-10-CM | POA: Diagnosis not present

## 2017-06-11 DIAGNOSIS — M546 Pain in thoracic spine: Secondary | ICD-10-CM

## 2017-06-11 DIAGNOSIS — G894 Chronic pain syndrome: Secondary | ICD-10-CM

## 2017-06-11 DIAGNOSIS — G4700 Insomnia, unspecified: Secondary | ICD-10-CM | POA: Insufficient documentation

## 2017-06-11 DIAGNOSIS — M159 Polyosteoarthritis, unspecified: Secondary | ICD-10-CM

## 2017-06-11 DIAGNOSIS — Z79899 Other long term (current) drug therapy: Secondary | ICD-10-CM

## 2017-06-11 DIAGNOSIS — M6281 Muscle weakness (generalized): Secondary | ICD-10-CM | POA: Insufficient documentation

## 2017-06-11 DIAGNOSIS — Z811 Family history of alcohol abuse and dependence: Secondary | ICD-10-CM | POA: Insufficient documentation

## 2017-06-11 DIAGNOSIS — M899 Disorder of bone, unspecified: Secondary | ICD-10-CM

## 2017-06-11 DIAGNOSIS — J029 Acute pharyngitis, unspecified: Secondary | ICD-10-CM | POA: Diagnosis not present

## 2017-06-11 DIAGNOSIS — M5442 Lumbago with sciatica, left side: Secondary | ICD-10-CM

## 2017-06-11 DIAGNOSIS — Z789 Other specified health status: Secondary | ICD-10-CM

## 2017-06-11 DIAGNOSIS — M47814 Spondylosis without myelopathy or radiculopathy, thoracic region: Secondary | ICD-10-CM | POA: Diagnosis not present

## 2017-06-11 DIAGNOSIS — M4854XA Collapsed vertebra, not elsewhere classified, thoracic region, initial encounter for fracture: Secondary | ICD-10-CM | POA: Diagnosis not present

## 2017-06-11 DIAGNOSIS — Z87891 Personal history of nicotine dependence: Secondary | ICD-10-CM | POA: Insufficient documentation

## 2017-06-11 DIAGNOSIS — R51 Headache: Secondary | ICD-10-CM

## 2017-06-11 DIAGNOSIS — M79671 Pain in right foot: Secondary | ICD-10-CM | POA: Diagnosis not present

## 2017-06-11 DIAGNOSIS — M4804 Spinal stenosis, thoracic region: Secondary | ICD-10-CM | POA: Diagnosis not present

## 2017-06-11 DIAGNOSIS — M542 Cervicalgia: Secondary | ICD-10-CM

## 2017-06-11 DIAGNOSIS — G8929 Other chronic pain: Secondary | ICD-10-CM

## 2017-06-11 DIAGNOSIS — M5441 Lumbago with sciatica, right side: Secondary | ICD-10-CM | POA: Diagnosis not present

## 2017-06-11 DIAGNOSIS — R079 Chest pain, unspecified: Secondary | ICD-10-CM | POA: Insufficient documentation

## 2017-06-11 DIAGNOSIS — M4802 Spinal stenosis, cervical region: Secondary | ICD-10-CM

## 2017-06-11 DIAGNOSIS — M7918 Myalgia, other site: Secondary | ICD-10-CM

## 2017-06-11 DIAGNOSIS — N179 Acute kidney failure, unspecified: Secondary | ICD-10-CM | POA: Diagnosis not present

## 2017-06-11 DIAGNOSIS — R296 Repeated falls: Secondary | ICD-10-CM

## 2017-06-11 DIAGNOSIS — N189 Chronic kidney disease, unspecified: Secondary | ICD-10-CM | POA: Diagnosis not present

## 2017-06-11 DIAGNOSIS — Z809 Family history of malignant neoplasm, unspecified: Secondary | ICD-10-CM | POA: Insufficient documentation

## 2017-06-11 DIAGNOSIS — M79605 Pain in left leg: Secondary | ICD-10-CM

## 2017-06-11 DIAGNOSIS — M549 Dorsalgia, unspecified: Secondary | ICD-10-CM | POA: Diagnosis not present

## 2017-06-11 DIAGNOSIS — M48061 Spinal stenosis, lumbar region without neurogenic claudication: Secondary | ICD-10-CM | POA: Diagnosis not present

## 2017-06-11 DIAGNOSIS — G4486 Cervicogenic headache: Secondary | ICD-10-CM

## 2017-06-11 DIAGNOSIS — M79672 Pain in left foot: Secondary | ICD-10-CM | POA: Insufficient documentation

## 2017-06-11 DIAGNOSIS — Z79891 Long term (current) use of opiate analgesic: Secondary | ICD-10-CM

## 2017-06-11 DIAGNOSIS — R519 Headache, unspecified: Secondary | ICD-10-CM

## 2017-06-11 DIAGNOSIS — M5134 Other intervertebral disc degeneration, thoracic region: Secondary | ICD-10-CM

## 2017-06-11 DIAGNOSIS — Z91030 Bee allergy status: Secondary | ICD-10-CM | POA: Diagnosis not present

## 2017-06-11 DIAGNOSIS — M5136 Other intervertebral disc degeneration, lumbar region: Secondary | ICD-10-CM | POA: Diagnosis not present

## 2017-06-11 DIAGNOSIS — K589 Irritable bowel syndrome without diarrhea: Secondary | ICD-10-CM | POA: Diagnosis not present

## 2017-06-11 DIAGNOSIS — R4020 Unspecified coma: Secondary | ICD-10-CM | POA: Diagnosis not present

## 2017-06-11 DIAGNOSIS — M79674 Pain in right toe(s): Secondary | ICD-10-CM

## 2017-06-11 DIAGNOSIS — Z87442 Personal history of urinary calculi: Secondary | ICD-10-CM | POA: Insufficient documentation

## 2017-06-11 DIAGNOSIS — Z8249 Family history of ischemic heart disease and other diseases of the circulatory system: Secondary | ICD-10-CM | POA: Insufficient documentation

## 2017-06-11 DIAGNOSIS — Z9049 Acquired absence of other specified parts of digestive tract: Secondary | ICD-10-CM | POA: Insufficient documentation

## 2017-06-11 DIAGNOSIS — Z86010 Personal history of colonic polyps: Secondary | ICD-10-CM | POA: Insufficient documentation

## 2017-06-11 DIAGNOSIS — M150 Primary generalized (osteo)arthritis: Secondary | ICD-10-CM

## 2017-06-11 DIAGNOSIS — M1288 Other specific arthropathies, not elsewhere classified, other specified site: Secondary | ICD-10-CM | POA: Diagnosis not present

## 2017-06-11 DIAGNOSIS — M79601 Pain in right arm: Secondary | ICD-10-CM

## 2017-06-11 DIAGNOSIS — M79652 Pain in left thigh: Secondary | ICD-10-CM

## 2017-06-11 DIAGNOSIS — M79675 Pain in left toe(s): Secondary | ICD-10-CM

## 2017-06-11 DIAGNOSIS — F1190 Opioid use, unspecified, uncomplicated: Secondary | ICD-10-CM

## 2017-06-11 DIAGNOSIS — M5030 Other cervical disc degeneration, unspecified cervical region: Secondary | ICD-10-CM

## 2017-06-11 DIAGNOSIS — R1030 Lower abdominal pain, unspecified: Secondary | ICD-10-CM

## 2017-06-11 DIAGNOSIS — M79651 Pain in right thigh: Secondary | ICD-10-CM

## 2017-06-11 DIAGNOSIS — S32040S Wedge compression fracture of fourth lumbar vertebra, sequela: Secondary | ICD-10-CM

## 2017-06-11 DIAGNOSIS — M25511 Pain in right shoulder: Secondary | ICD-10-CM

## 2017-06-11 DIAGNOSIS — M79602 Pain in left arm: Secondary | ICD-10-CM

## 2017-06-11 DIAGNOSIS — M79604 Pain in right leg: Secondary | ICD-10-CM

## 2017-06-11 NOTE — Patient Instructions (Signed)

## 2017-06-11 NOTE — Progress Notes (Signed)
Safety precautions to be maintained throughout the outpatient stay will include: orient to surroundings, keep bed in low position, maintain call bell within reach at all times, provide assistance with transfer out of bed and ambulation.  

## 2017-06-16 LAB — COMPLIANCE DRUG ANALYSIS, UR

## 2017-06-21 LAB — COMP. METABOLIC PANEL (12)
ALBUMIN: 4.4 g/dL (ref 3.5–5.5)
ALK PHOS: 83 IU/L (ref 39–117)
AST: 31 IU/L (ref 0–40)
Albumin/Globulin Ratio: 1.9 (ref 1.2–2.2)
BILIRUBIN TOTAL: 0.2 mg/dL (ref 0.0–1.2)
BUN/Creatinine Ratio: 14 (ref 9–20)
BUN: 22 mg/dL (ref 6–24)
CHLORIDE: 102 mmol/L (ref 96–106)
Calcium: 9.2 mg/dL (ref 8.7–10.2)
Creatinine, Ser: 1.53 mg/dL — ABNORMAL HIGH (ref 0.76–1.27)
GFR calc Af Amer: 57 mL/min/{1.73_m2} — ABNORMAL LOW (ref >59)
GFR, EST NON AFRICAN AMERICAN: 49 mL/min/{1.73_m2} — AB (ref >59)
GLOBULIN, TOTAL: 2.3 g/dL (ref 1.5–4.5)
GLUCOSE: 94 mg/dL (ref 65–99)
POTASSIUM: 4.7 mmol/L (ref 3.5–5.2)
SODIUM: 143 mmol/L (ref 134–144)
Total Protein: 6.7 g/dL (ref 6.0–8.5)

## 2017-06-21 LAB — MAGNESIUM: MAGNESIUM: 2 mg/dL (ref 1.6–2.3)

## 2017-06-21 LAB — 25-HYDROXY VITAMIN D LCMS D2+D3
25-Hydroxy, Vitamin D-2: <1 ng/mL
25-Hydroxy, Vitamin D-3: 94 ng/mL
25-Hydroxy, Vitamin D: 94 ng/mL

## 2017-06-21 LAB — C-REACTIVE PROTEIN: CRP: 6.1 mg/L — ABNORMAL HIGH (ref 0.0–4.9)

## 2017-06-21 LAB — SEDIMENTATION RATE: SED RATE: 2 mm/h (ref 0–30)

## 2017-06-21 LAB — VITAMIN B12: VITAMIN B 12: 321 pg/mL (ref 232–1245)

## 2017-06-30 DIAGNOSIS — Z79899 Other long term (current) drug therapy: Secondary | ICD-10-CM | POA: Diagnosis not present

## 2017-06-30 DIAGNOSIS — M5440 Lumbago with sciatica, unspecified side: Secondary | ICD-10-CM | POA: Diagnosis not present

## 2017-06-30 DIAGNOSIS — G8921 Chronic pain due to trauma: Secondary | ICD-10-CM | POA: Diagnosis not present

## 2017-06-30 DIAGNOSIS — G894 Chronic pain syndrome: Secondary | ICD-10-CM | POA: Diagnosis not present

## 2017-06-30 DIAGNOSIS — M542 Cervicalgia: Secondary | ICD-10-CM | POA: Diagnosis not present

## 2017-07-04 ENCOUNTER — Ambulatory Visit
Admission: RE | Admit: 2017-07-04 | Discharge: 2017-07-04 | Disposition: A | Discharge status: Home / Self Care | Payer: Medicare Other | Source: Ambulatory Visit | Attending: Pain Medicine | Admitting: Pain Medicine

## 2017-07-04 DIAGNOSIS — R296 Repeated falls: Secondary | ICD-10-CM

## 2017-07-04 DIAGNOSIS — G4486 Cervicogenic headache: Secondary | ICD-10-CM

## 2017-07-04 DIAGNOSIS — M48061 Spinal stenosis, lumbar region without neurogenic claudication: Secondary | ICD-10-CM | POA: Insufficient documentation

## 2017-07-04 DIAGNOSIS — M5146 Schmorl's nodes, lumbar region: Secondary | ICD-10-CM | POA: Diagnosis not present

## 2017-07-04 DIAGNOSIS — M6281 Muscle weakness (generalized): Secondary | ICD-10-CM

## 2017-07-04 DIAGNOSIS — M5134 Other intervertebral disc degeneration, thoracic region: Secondary | ICD-10-CM | POA: Insufficient documentation

## 2017-07-04 DIAGNOSIS — M4804 Spinal stenosis, thoracic region: Secondary | ICD-10-CM | POA: Diagnosis not present

## 2017-07-04 DIAGNOSIS — M5441 Lumbago with sciatica, right side: Secondary | ICD-10-CM

## 2017-07-04 DIAGNOSIS — M47812 Spondylosis without myelopathy or radiculopathy, cervical region: Secondary | ICD-10-CM | POA: Diagnosis not present

## 2017-07-04 DIAGNOSIS — M5124 Other intervertebral disc displacement, thoracic region: Secondary | ICD-10-CM | POA: Diagnosis not present

## 2017-07-04 DIAGNOSIS — M47814 Spondylosis without myelopathy or radiculopathy, thoracic region: Secondary | ICD-10-CM | POA: Diagnosis not present

## 2017-07-04 DIAGNOSIS — M50322 Other cervical disc degeneration at C5-C6 level: Secondary | ICD-10-CM | POA: Diagnosis not present

## 2017-07-04 DIAGNOSIS — S32040S Wedge compression fracture of fourth lumbar vertebra, sequela: Secondary | ICD-10-CM

## 2017-07-04 DIAGNOSIS — M5030 Other cervical disc degeneration, unspecified cervical region: Secondary | ICD-10-CM

## 2017-07-04 DIAGNOSIS — M542 Cervicalgia: Secondary | ICD-10-CM

## 2017-07-04 DIAGNOSIS — M4802 Spinal stenosis, cervical region: Secondary | ICD-10-CM

## 2017-07-04 DIAGNOSIS — M4302 Spondylolysis, cervical region: Secondary | ICD-10-CM | POA: Insufficient documentation

## 2017-07-04 DIAGNOSIS — R51 Headache: Secondary | ICD-10-CM

## 2017-07-04 DIAGNOSIS — S22080A Wedge compression fracture of T11-T12 vertebra, initial encounter for closed fracture: Secondary | ICD-10-CM

## 2017-07-04 DIAGNOSIS — M47816 Spondylosis without myelopathy or radiculopathy, lumbar region: Secondary | ICD-10-CM | POA: Diagnosis not present

## 2017-07-04 DIAGNOSIS — M5136 Other intervertebral disc degeneration, lumbar region: Secondary | ICD-10-CM

## 2017-07-04 DIAGNOSIS — D1809 Hemangioma of other sites: Secondary | ICD-10-CM | POA: Insufficient documentation

## 2017-07-04 DIAGNOSIS — G8929 Other chronic pain: Secondary | ICD-10-CM

## 2017-07-04 DIAGNOSIS — M5442 Lumbago with sciatica, left side: Secondary | ICD-10-CM

## 2017-07-08 DIAGNOSIS — R4020 Unspecified coma: Secondary | ICD-10-CM | POA: Diagnosis not present

## 2017-07-14 DIAGNOSIS — G894 Chronic pain syndrome: Secondary | ICD-10-CM | POA: Diagnosis not present

## 2017-07-14 DIAGNOSIS — M542 Cervicalgia: Secondary | ICD-10-CM | POA: Diagnosis not present

## 2017-07-14 DIAGNOSIS — M5440 Lumbago with sciatica, unspecified side: Secondary | ICD-10-CM | POA: Diagnosis not present

## 2017-07-14 DIAGNOSIS — G8921 Chronic pain due to trauma: Secondary | ICD-10-CM | POA: Diagnosis not present

## 2017-07-14 DIAGNOSIS — Z79899 Other long term (current) drug therapy: Secondary | ICD-10-CM | POA: Diagnosis not present

## 2017-07-17 ENCOUNTER — Other Ambulatory Visit: Payer: Self-pay | Admitting: Neurology

## 2017-07-17 DIAGNOSIS — R4020 Unspecified coma: Secondary | ICD-10-CM

## 2017-07-17 NOTE — Progress Notes (Signed)
Results were reviewed and found to be: mildly abnormal  Surgical consultation may be of benefit  Review would suggest interventional pain management techniques may be of benefit

## 2017-07-18 NOTE — Progress Notes (Signed)
Results were reviewed and found to be: abnormal  Surgical consultation is recommended: Send referral to NS for consideration on T11 & T12 Kyphoplasty  Review would suggest interventional pain management techniques may be of benefit: LESI T11-12.  I also recommend PCP evaluation to treat possible osteoporosis. Consider bone density testing, testosterone levels, etc.

## 2017-07-28 ENCOUNTER — Ambulatory Visit
Admission: RE | Admit: 2017-07-28 | Discharge: 2017-07-28 | Disposition: A | Discharge status: Home / Self Care | Payer: Medicare Other | Source: Ambulatory Visit | Attending: Neurology | Admitting: Neurology

## 2017-07-28 DIAGNOSIS — R55 Syncope and collapse: Secondary | ICD-10-CM | POA: Diagnosis not present

## 2017-07-28 DIAGNOSIS — R4020 Unspecified coma: Secondary | ICD-10-CM | POA: Insufficient documentation

## 2017-07-28 LAB — POCT I-STAT CREATININE: Creatinine, Ser: 1.5 mg/dL — ABNORMAL HIGH (ref 0.61–1.24)

## 2017-07-28 MED ORDER — GADOBENATE DIMEGLUMINE 529 MG/ML IV SOLN
15.0000 mL | Freq: Once | INTRAVENOUS | Status: AC | PRN
  Administered 2017-07-28: 15 mL via INTRAVENOUS

## 2017-08-04 DIAGNOSIS — G894 Chronic pain syndrome: Secondary | ICD-10-CM | POA: Diagnosis not present

## 2017-08-04 DIAGNOSIS — Z79899 Other long term (current) drug therapy: Secondary | ICD-10-CM | POA: Diagnosis not present

## 2017-08-04 DIAGNOSIS — G8921 Chronic pain due to trauma: Secondary | ICD-10-CM | POA: Diagnosis not present

## 2017-08-04 DIAGNOSIS — M5440 Lumbago with sciatica, unspecified side: Secondary | ICD-10-CM | POA: Diagnosis not present

## 2017-08-04 DIAGNOSIS — M542 Cervicalgia: Secondary | ICD-10-CM | POA: Diagnosis not present

## 2017-08-07 DIAGNOSIS — R4020 Unspecified coma: Secondary | ICD-10-CM | POA: Diagnosis not present

## 2017-09-10 ENCOUNTER — Telehealth: Payer: Self-pay | Admitting: Pain Medicine

## 2017-09-10 NOTE — Telephone Encounter (Signed)
Patient is seeing Dr. Manuella Ghazi, neurology and they are checking into some back issues. Wants to wait to see Dr. Dossie Arbour about getting pain pump. But wants to know if Dr. Dossie Arbour wants him to still have nerve conductions done.

## 2017-09-10 NOTE — Telephone Encounter (Signed)
Patient wants to hold off on Nerve conduction study and follow up for  Pump implant until he clears up some things going on with neuro. Instructed patient to call if and when he wants [place] to help him.

## 2017-09-11 DIAGNOSIS — M5440 Lumbago with sciatica, unspecified side: Secondary | ICD-10-CM | POA: Diagnosis not present

## 2017-09-11 DIAGNOSIS — G8921 Chronic pain due to trauma: Secondary | ICD-10-CM | POA: Diagnosis not present

## 2017-09-11 DIAGNOSIS — M542 Cervicalgia: Secondary | ICD-10-CM | POA: Diagnosis not present

## 2017-09-11 DIAGNOSIS — Z79899 Other long term (current) drug therapy: Secondary | ICD-10-CM | POA: Diagnosis not present

## 2017-09-11 DIAGNOSIS — G894 Chronic pain syndrome: Secondary | ICD-10-CM | POA: Diagnosis not present

## 2017-09-15 DIAGNOSIS — G8921 Chronic pain due to trauma: Secondary | ICD-10-CM | POA: Diagnosis not present

## 2017-09-15 DIAGNOSIS — G894 Chronic pain syndrome: Secondary | ICD-10-CM | POA: Diagnosis not present

## 2017-09-15 DIAGNOSIS — Z79899 Other long term (current) drug therapy: Secondary | ICD-10-CM | POA: Diagnosis not present

## 2017-09-15 DIAGNOSIS — M5440 Lumbago with sciatica, unspecified side: Secondary | ICD-10-CM | POA: Diagnosis not present

## 2017-09-15 DIAGNOSIS — M542 Cervicalgia: Secondary | ICD-10-CM | POA: Diagnosis not present

## 2017-09-26 DIAGNOSIS — R569 Unspecified convulsions: Secondary | ICD-10-CM | POA: Diagnosis not present

## 2017-09-27 DIAGNOSIS — R569 Unspecified convulsions: Secondary | ICD-10-CM | POA: Diagnosis not present

## 2017-09-28 DIAGNOSIS — R569 Unspecified convulsions: Secondary | ICD-10-CM | POA: Diagnosis not present

## 2017-10-06 DIAGNOSIS — R4020 Unspecified coma: Secondary | ICD-10-CM | POA: Diagnosis not present

## 2017-10-13 DIAGNOSIS — Z79899 Other long term (current) drug therapy: Secondary | ICD-10-CM | POA: Diagnosis not present

## 2017-10-13 DIAGNOSIS — M542 Cervicalgia: Secondary | ICD-10-CM | POA: Diagnosis not present

## 2017-10-13 DIAGNOSIS — G894 Chronic pain syndrome: Secondary | ICD-10-CM | POA: Diagnosis not present

## 2017-11-10 DIAGNOSIS — M5440 Lumbago with sciatica, unspecified side: Secondary | ICD-10-CM | POA: Diagnosis not present

## 2017-11-10 DIAGNOSIS — G894 Chronic pain syndrome: Secondary | ICD-10-CM | POA: Diagnosis not present

## 2017-11-10 DIAGNOSIS — M542 Cervicalgia: Secondary | ICD-10-CM | POA: Diagnosis not present

## 2017-11-10 DIAGNOSIS — Z79899 Other long term (current) drug therapy: Secondary | ICD-10-CM | POA: Diagnosis not present

## 2017-11-17 DIAGNOSIS — M5440 Lumbago with sciatica, unspecified side: Secondary | ICD-10-CM | POA: Diagnosis not present

## 2017-11-17 DIAGNOSIS — M542 Cervicalgia: Secondary | ICD-10-CM | POA: Diagnosis not present

## 2017-11-17 DIAGNOSIS — G894 Chronic pain syndrome: Secondary | ICD-10-CM | POA: Diagnosis not present

## 2017-11-17 DIAGNOSIS — Z79899 Other long term (current) drug therapy: Secondary | ICD-10-CM | POA: Diagnosis not present

## 2017-12-01 ENCOUNTER — Ambulatory Visit: Payer: Medicare Other | Attending: Pain Medicine | Admitting: Pain Medicine

## 2017-12-01 ENCOUNTER — Other Ambulatory Visit: Payer: Self-pay

## 2017-12-01 ENCOUNTER — Encounter: Payer: Self-pay | Admitting: Pain Medicine

## 2017-12-01 VITALS — BP 125/83 | HR 85 | Temp 98.2°F | Resp 18 | Ht 66.0 in | Wt 190.0 lb

## 2017-12-01 DIAGNOSIS — Z87891 Personal history of nicotine dependence: Secondary | ICD-10-CM | POA: Diagnosis not present

## 2017-12-01 DIAGNOSIS — F1190 Opioid use, unspecified, uncomplicated: Secondary | ICD-10-CM | POA: Diagnosis not present

## 2017-12-01 DIAGNOSIS — G4486 Cervicogenic headache: Secondary | ICD-10-CM

## 2017-12-01 DIAGNOSIS — M5134 Other intervertebral disc degeneration, thoracic region: Secondary | ICD-10-CM | POA: Insufficient documentation

## 2017-12-01 DIAGNOSIS — E875 Hyperkalemia: Secondary | ICD-10-CM | POA: Insufficient documentation

## 2017-12-01 DIAGNOSIS — M79674 Pain in right toe(s): Secondary | ICD-10-CM | POA: Diagnosis not present

## 2017-12-01 DIAGNOSIS — M542 Cervicalgia: Secondary | ICD-10-CM | POA: Insufficient documentation

## 2017-12-01 DIAGNOSIS — M546 Pain in thoracic spine: Secondary | ICD-10-CM

## 2017-12-01 DIAGNOSIS — M5030 Other cervical disc degeneration, unspecified cervical region: Secondary | ICD-10-CM | POA: Diagnosis not present

## 2017-12-01 DIAGNOSIS — M2578 Osteophyte, vertebrae: Secondary | ICD-10-CM | POA: Insufficient documentation

## 2017-12-01 DIAGNOSIS — M79651 Pain in right thigh: Secondary | ICD-10-CM | POA: Diagnosis not present

## 2017-12-01 DIAGNOSIS — M79652 Pain in left thigh: Secondary | ICD-10-CM

## 2017-12-01 DIAGNOSIS — M79675 Pain in left toe(s): Secondary | ICD-10-CM | POA: Diagnosis not present

## 2017-12-01 DIAGNOSIS — M4856XA Collapsed vertebra, not elsewhere classified, lumbar region, initial encounter for fracture: Secondary | ICD-10-CM | POA: Insufficient documentation

## 2017-12-01 DIAGNOSIS — M25511 Pain in right shoulder: Secondary | ICD-10-CM | POA: Diagnosis not present

## 2017-12-01 DIAGNOSIS — G894 Chronic pain syndrome: Secondary | ICD-10-CM

## 2017-12-01 DIAGNOSIS — G8929 Other chronic pain: Secondary | ICD-10-CM

## 2017-12-01 DIAGNOSIS — R519 Headache, unspecified: Secondary | ICD-10-CM

## 2017-12-01 DIAGNOSIS — L739 Follicular disorder, unspecified: Secondary | ICD-10-CM | POA: Diagnosis not present

## 2017-12-01 DIAGNOSIS — M48061 Spinal stenosis, lumbar region without neurogenic claudication: Secondary | ICD-10-CM | POA: Diagnosis not present

## 2017-12-01 DIAGNOSIS — G4700 Insomnia, unspecified: Secondary | ICD-10-CM | POA: Insufficient documentation

## 2017-12-01 DIAGNOSIS — M549 Dorsalgia, unspecified: Secondary | ICD-10-CM | POA: Diagnosis not present

## 2017-12-01 DIAGNOSIS — I10 Essential (primary) hypertension: Secondary | ICD-10-CM | POA: Diagnosis not present

## 2017-12-01 DIAGNOSIS — M5136 Other intervertebral disc degeneration, lumbar region: Secondary | ICD-10-CM | POA: Diagnosis not present

## 2017-12-01 DIAGNOSIS — R51 Headache: Secondary | ICD-10-CM

## 2017-12-01 NOTE — Progress Notes (Signed)
Patient's Name: Samuel Burke  MRN: [id number]  Referring Provider: Jearld Fenton, NP  DOB: 06-26-1961  PCP: Jearld Fenton, NP  DOS: 12/01/2017  Note by: Gaspar Cola, MD  Service setting: Ambulatory outpatient  Specialty: Interventional Pain Management  Location: [facility] (AMB) Pain Management Facility    Patient type: Established   Primary Reason(s) for Visit: Encounter for evaluation before starting new chronic pain management plan of care (Level of risk: moderate) CC: Neck Pain  HPI  Samuel Burke is a 57 y.o. year old, male patient, who comes today for a follow-up evaluation to review the test results and decide on a treatment plan. He has Essential hypertension; Spinal stenosis of lumbar region without neurogenic claudication; Disease characterized by destruction of skeletal muscle; Insomnia; Folliculitis barbae; AKI (acute kidney injury) ([place]); Cholecystitis; Chronic pain syndrome; Health care maintenance; Chronic sore throat; Hoarseness or changing voice; Hyperkalemia; Sore throat and laryngitis; Swelling of left lower extremity; Neck pain; Cervicogenic headache (Fourth Area of Pain) (Bilateral) (R>L); Frequent falls; Muscle weakness of lower extremity; Chronic low back pain (Bilateral) (Midline) (R>L); Disorder of skeletal system; Pharmacologic therapy; Problems influencing health status; Chronic musculoskeletal pain; Osteoarthritis; Cervical facet arthropathy; Cervical foraminal stenosis (Multilevel) (Bilateral); DDD (degenerative disc disease), cervical; Compression fracture of T12 vertebra ([place]); Compression fracture of L4 lumbar vertebra, sequela; Lumbar facet arthropathy (Bilateral); Lumbar Lateral recess stenosis (Left) (L4-5); Long term prescription benzodiazepine use; Long term prescription opiate use; Long-term use of high-risk medication; Opiate use (180 MME/day); Consciousness loss of ([place]); Chronic thoracic back pain (Midline) (Primary Area of Pain); Chronic upper back pain  (Secondary Area of Pain) (Midline) (Bilateral) (L>R); Chronic foot pain (Tertiary Area of Pain) (Bilateral) (L>R); Occipital headache (Fourth Area of Pain) (Bilateral) (R>L); Chronic thigh pain (Fifth Area of Pain) (Bilateral) (R>L); Chronic groin pain (Fifth Area of Pain) (Bilateral) (R>L); Chronic upper extremity pain (Bilateral) (L>R); Chronic shoulder pain (Right); Chronic neck pain; DDD (degenerative disc disease), thoracic; DDD (degenerative disc disease), lumbar; and Chronic lower extremity pain (Bilateral) (L>R) on their problem list. His primarily concern today is the Neck Pain  Pain Assessment: Location:   Neck Radiating: radiates down spine to coccyx Onset: More than a month ago Duration: Chronic pain Quality: Aching, Sharp, Stabbing Severity: 10-Worst pain ever(pattient states 11, reduced to 10 when questioned)/10 (subjective, self-reported pain score)  Note: Reported level is inconsistent with clinical observations. Clinically the patient looks like a 4/10 A 4/10 is viewed as "Moderately Severe" and described as impossible to ignore for more than a few minutes. With effort, patients may still be able to manage work or participate in some social activities. Very difficult to concentrate. Signs of autonomic nervous system discharge are evident: dilated pupils (mydriasis); mild sweating (diaphoresis); sleep interference. Heart rate becomes elevated (>115 bpm). Diastolic blood pressure (lower number) rises above 100 mmHg. Patients find relief in laying down and not moving. Information on the proper use of the pain scale provided to the patient today. When using our objective Pain Scale, levels between 6 and 10/10 are said to belong in an emergency room, as it progressively worsens from a 6/10, described as severely limiting, requiring emergency care not usually available at an outpatient pain management facility. At a 6/10 level, communication becomes difficult and requires great effort. Assistance  to reach the emergency department may be required. Facial flushing and profuse sweating along with potentially dangerous increases in heart rate and blood pressure will be evident. Timing: Constant Modifying factors: denies BP: 125/83  HR:  92  Samuel Burke comes in today for a follow-up visit after his initial evaluation on 06/11/17. Today we went over the results of his tests. These were explained in "L[place]man's terms". During today's appointment we went over my diagnostic impression, as well as the proposed treatment plan.  Since that initial visit the patient had x-rays of the cervical, thoracic, and lumbar spine, followed by MRIs of the cervical, thoracic, and lumbar spines.  These have revealed multiple vertebral body fractures and evidence of osteoporosis, likely to be secondary to the long-term use of opioid analgesics, leading to low testosterone levels and subsequent osteoporosis/osteopenia.  In addition to this, the patient has a significantly high daily opioid use of 180 MME/day.  He denies having had any "Drug Holidays".  He describes that his tolerance has been managed with either opioid rotations or increases in his pain medication.  The patient was referred to our clinic by Dr. Nathanial Rancher (Pain Management) for evaluation to determine if the patient is a good candidate for an intrathecal morphine pump trial and implant.  According to the patient he has multiple areas of pain.  The primary pain is that of the mid (thoracic) back, in the midline, and does not radiate to either side.  The patient indicates that he has never had any surgery in the area but he does admit to having had some nerve blocks done around 2010 at the [facility] in [place].  He also indicates having had physical therapy around the same time, which did not help and in fact made things worse.  Indicates that the physical therapy consisted of 2 sessions per week for approximately 3 months.  The patient indicates  having seen surgeons in the past, with all of them telling him that surgery may make things worse.  The second area of pain is described to be that of the upper back in the midline with pain also being bilateral and worse on the left when compared to the right.  Again he denies any surgeries, nerve blocks, but he does indicate having had an MRI of the area.  He also indicates that he had the same physical therapy session treat that upper back pain as well.  The third area of pain and status of his feet, bilaterally, with the left being worse than the right.  He describes the pain in his feet to be over the top of the foot and what could be an L5 dermatomal distribution.  He denies any surgery in that area but does admit to having had an injury that caused a fracture of 3 different bones in each feet.  He denies any joint injections or nerve blocks in that area.  He denies any recent x-rays or nerve conduction test.  In addition, the patient indicates that the physical therapy that he had around 2010-2012 was meant to cover that area as well.  The fourth area of pain is that of the back of his head (occipital region) where he indicates having occipital headaches bilaterally with most of the pain and some numbness pain in the middle area.  Even though the pain is bilateral the right side seems to be worse than the left.  He denies any surgeries, nerve blocks, x-rays, or physical therapy of that area.  The fifth area of pain is that of his thighs and groin, bilaterally, with the right being worse than the left.  The patient denies any surgeries, nerve blocks, x-rays, or physical therapy for that  particular pain.  The only physical therapy that he had was that there was for his back which he describes now that it took place more than 2 years ago and it did not help.  The next area of pain is described to be that of the upper extremities, bilaterally, with the left being worse than the right.  In the case  of the left upper extremity the pain is in the forearm and it is associated with some weakness.  He denies any surgeries, nerve blocks, joint injections, x-rays, or physical therapy for that pain.  In the case of the right upper extremity the pain is primarily in the area of the shoulder.  He denies any surgeries, nerve blocks, joint injections, x-rays, or physical therapy for that shoulder pain.  The next area pain is described to be that of the lower back, bilaterally, with the pain being in the center of the back but radiating to both sides with the right being worse than the left.  He denies any back surgery, nerve blocks, joint injections, but does indicate that the same physical therapy and MRI/CT scans that were done for his back included the lower back.  So far the patient indicates that his treatment has consisted of pain medications which despite the fact that the pain is described to have started around August 2010, he indicates that he has been on pain medication for the past 3-4 years, nonstop.  He has been to several other pain clinics including the [facility] pain clinic, a pain clinic at [facility], and another pain clinic in [place] which he only went for 2 visits and describes that could not see eye-to-eye with the physician.  Currently his pain is being managed by Dr. Nathanial Rancher.  Areas of major concern include the fact that he says that he will intermittently lose consciousness and that the triggers for this include prolonged standing, prolonged sitting, shopping, and traveling.  In terms of the predictability, he indicates that he has a sense of when he may be losing consciousness and laid down and abort the episode, but he cannot predict it with certainty.  The frequency is of about 4-5 episodes per week.  The duration is of approximately 4 hours.  The onset of this problem was around 2014.  And residual effects after he regained his consciousness include numbness and  paralysis of his upper and lower extremities.  He indicates that he has never had this looked at by a neurologist, until recently when he was evaluated by the [facility] neurology department.  He describes that he was released by Dr. Manuella Ghazi, who indicated that he was cleared to proceed with an intrathecal pump, if he desired.  (According to the patient).  During today's visit, I informed the patient about the process associated with the intrathecal pump trial and implant.  I informed him of the need to have a medical psychology evaluation for implants. I informed him of the risk and possible complications including bleeding, infection, nerve damage (including paralysis), allergic reactions, death, or any other unforeseen complication associated with the interventional portion of the procedure.  We also spoke about the possibility of intrathecal granulomatous, the medications that are currently FDA approved to be used in the pump, they "off label" use of medications, and what that means.  I also asked the patient about his preference in terms of who would be managing his intrathecal pump.  I informed him that should he decide to have [place]  manage the pump, he would need to be familiarized with the concept of "Drug Holidays".  I informed him that this was our way of managing tolerance and what it involved.  I also informed him that because I have had the bad experience of having patients tell me that they will be following our drug holidays' protocol, just to changed their minds once the implants are in, we now require that these be done before the implant.  In considering the treatment plan options, Mr. Parmer was reminded that I no longer take patients for medication management only. I asked him to let me know if he had no intention of taking advantage of the interventional therapies, so that we could make arrangements to provide this space to someone interested. I also made it clear that undergoing  interventional therapies for the purpose of getting pain medications is very inappropriate on the part of a patient, and it will not be tolerated in this practice. This type of behavior would suggest true addiction and therefore it requires referral to an addiction specialist.   After everything was said and done, the patient indicated that he needed some time to think about it and therefore we have instructed him to give [place] a call if he so chooses to proceed with the intrathecal pump trial.  I have informed him that it is very likely that should he decide to proceed with the implant, I will be referring him out for the actual pump implant rather than me doing the implant myself.  He understood and accepted.  Further details on both, my assessment(s), as well as the proposed treatment plan, please see below.  Controlled Substance Pharmacotherapy Assessment REMS (Risk Evaluation and Mitigation Strategy)  Analgesic: Morphine ER 60 mg by mouth 3 times a day (written on 05/19/2017 and failed on 05/24/2017)  Highest recorded MME/day: 310 mg/day MME/day: 180 mg/day Pill Count: None expected due to no prior prescriptions written by our practice. No notes on file Pharmacokinetics: Liberation and absorption (onset of action): WNL Distribution (time to peak effect): WNL Metabolism and excretion (duration of action): WNL         Pharmacodynamics: Desired effects: Analgesia: Mr. Vandam reports >50% benefit. Functional ability: Patient reports that medication allows him to accomplish basic ADLs Clinically meaningful improvement in function (CMIF): Sustained CMIF goals met Perceived effectiveness: Described as relatively effective, allowing for increase in activities of daily living (ADL) Undesirable effects: Side-effects or Adverse reactions: None reported Monitoring: [place] PMP: Online review of the past 65-monthperiod previously conducted. Not applicable at this point since we have not taken over the  patient's medication management yet. List of other Serum/Urine Drug Screening Test(s):  Lab Results  Component Value Date   COCAINSCRNUR NONE DETECTED 01/27/2015   THCU NONE DETECTED 01/27/2015   List of all UDS test(s) done:  Lab Results  Component Value Date   SUMMARY FINAL 06/11/2017   Last UDS on record: Summary  Date Value Ref Range Status  06/11/2017 FINAL  Final    Comment:    ==================================================================== TOXASSURE COMP DRUG ANALYSIS,UR ==================================================================== Test                             Result       Flag       Units Drug Present and Declared for Prescription Verification   Desmethyldiazepam              122  EXPECTED   ng/mg creat   Oxazepam                       322          EXPECTED   ng/mg creat   Temazepam                      299          EXPECTED   ng/mg creat    Desmethyldiazepam, oxazepam, and temazepam are expected    metabolites of diazepam. Desmethyldiazepam and oxazepam are also    expected metabolites of other drugs, including chlordiazepoxide,    prazepam, clorazepate, and halazepam. Oxazepam is an expected    metabolite of temazepam. Oxazepam and temazepam are also    available as scheduled prescription medications.   Morphine                       >3484        EXPECTED   ng/mg creat   Normorphine                    894          EXPECTED   ng/mg creat    Potential sources of large amounts of morphine in the absence of    codeine include administration of morphine or use of heroin.    Normorphine is an expected metabolite of morphine.   Hydromorphone                  576          EXPECTED   ng/mg creat    Hydromorphone may be present as a metabolite of morphine;    concentrations of hydromorphone rarely exceed 5% of the morphine    concentration when this is the source of hydromorphone.   Gabapentin                     PRESENT      EXPECTED   Baclofen                        PRESENT      EXPECTED   Diclofenac                     PRESENT      EXPECTED ==================================================================== Test                      Result    Flag   Units      Ref Range   Creatinine              287              mg/dL      >=20 ==================================================================== Declared Medications:  The flagging and interpretation on this report are based on the  following declared medications.  Unexpected results may arise from  inaccuracies in the declared medications.  **Note: The testing scope of this panel includes these medications:  Baclofen  Diazepam  Gabapentin  Morphine (Morphine Sulfate)  **Note: The testing scope of this panel does not include small to  moderate amounts of these reported medications:  Diclofenac  **Note: The testing scope of this panel does not include following  reported medications:  Cholecalciferol  Docusate  Lisinopril  Loratadine  Nystatin ==================================================================== For clinical consultation, please call [phone]. ====================================================================  UDS interpretation: No unexpected findings. Patient informed of the CDC guidelines and recommendations to stay away from the concomitant use of benzodiazepines and opioids due to the increased risk of respiratory depression and death. Medication Assessment Form: Not applicable. Treatment compliance: Not applicable Risk Assessment Profile: Aberrant behavior: extensive time discussing medicaiton and significant opioid tolerance Comorbid factors increasing risk of overdose: Benzodiazepine use, concomitant use of Benzodiazepines, high daily dosage  and male gender Medical Psychology Evaluation: Moderate Risk Opioid Risk Tool - 12/01/17 1119      Family History of Substance Abuse   Alcohol  Positive Male    Illegal Drugs  Negative    Rx Drugs   Negative      Personal History of Substance Abuse   Alcohol  Positive Male or Male    Illegal Drugs  Negative    Rx Drugs  Negative      Age   Age between 45-45 years   No      History of Preadolescent Sexual Abuse   History of Preadolescent Sexual Abuse  Negative or Male      Psychological Disease   Psychological Disease  Negative    Depression  Negative      Total Score   Opioid Risk Tool Scoring  6    Opioid Risk Interpretation  Moderate Risk      ORT Scoring interpretation table:  Score <3 = Low Risk for SUD  Score between 4-7 = Moderate Risk for SUD  Score >8 = High Risk for Opioid Abuse   Risk Mitigation Strategies:  Patient opioid safety counseling: Covered. Patient-Prescriber Agreement (PPA): No agreement signed.  Controlled substance notification to other providers: Not applicable  Pharmacologic Plan: No opioid analgesic prescribed.             Laboratory Chemistry  Inflammation Markers (CRP: Acute Phase) (ESR: Chronic Phase) Lab Results  Component Value Date   CRP 6.1 (H) 06/11/2017   ESRSEDRATE 2 06/11/2017                         Renal Function Markers Lab Results  Component Value Date   BUN 22 06/11/2017   CREATININE 1.50 (H) 07/28/2017   GFRAA 57 (L) 06/11/2017   GFRNONAA 49 (L) 06/11/2017                              Hepatic Function Markers Lab Results  Component Value Date   AST 31 06/11/2017   ALT 35 05/01/2017   ALBUMIN 4.4 06/11/2017   ALKPHOS 83 06/11/2017   HCVAB NEGATIVE 04/29/2016                        Electrolytes Lab Results  Component Value Date   NA 143 06/11/2017   K 4.7 06/11/2017   CL 102 06/11/2017   CALCIUM 9.2 06/11/2017   MG 2.0 06/11/2017                        Neuropathy Markers Lab Results  Component Value Date   VITAMINB12 321 06/11/2017   HIV NONREACTIVE 04/29/2016                        Bone Pathology Markers Lab Results  Component Value Date   25OHVITD1 94 06/11/2017   25OHVITD2 <1.0  06/11/2017   25OHVITD3 94 06/11/2017  Coagulation Parameters Lab Results  Component Value Date   PLT 221.0 05/01/2017                        Cardiovascular Markers Lab Results  Component Value Date   CKTOTAL 1,466 (H) 01/29/2015   HGB 14.0 05/01/2017   HCT 42.5 05/01/2017                         Note: Lab results reviewed.  Recent Diagnostic Imaging Review  Cervical Imaging: Cervical MR wo contrast:  Results for orders placed during the hospital encounter of 07/04/17  MR CERVICAL SPINE WO CONTRAST   Narrative CLINICAL DATA:  Initial evaluation for chronic pain, spinal stenosis, diffuse numbness, worsened over past 2 years.  EXAM: MRI CERVICAL, THORACIC AND LUMBAR SPINE WITHOUT CONTRAST  TECHNIQUE: Multiplanar and multiecho pulse sequences of the cervical spine, to include the craniocervical junction and cervicothoracic junction, and thoracic and lumbar spine, were obtained without intravenous contrast.  COMPARISON:  Comparison made with prior radiograph from 06/11/2017 as well as previous MRI from 01/27/2015.  FINDINGS: MRI CERVICAL SPINE FINDINGS  Alignment: Smooth reversal of the normal cervical lordosis. No listhesis or malalignment.  Vertebrae: Vertebral body heights are maintained without evidence for acute or chronic fracture. Signal intensity within the vertebral body bone marrow within normal limits. No discrete or worrisome osseous lesions. No abnormal marrow edema.  Cord: Signal intensity within the cervical spinal cord is normal.  Posterior Fossa, vertebral arteries, paraspinal tissues: Visualized brain and posterior fossa within normal limits. Craniocervical junction normal. Paraspinous and prevertebral soft tissues within normal limits. Normal intravascular flow voids present within the vertebral arteries bilaterally.  Disc levels:  C2-C3: Mild annular disc bulge with bilateral uncovertebral hypertrophy. Moderate  bilateral facet degeneration. Mild flattening of the ventral thecal sac without significant spinal stenosis. Mild bilateral C3 foraminal narrowing, slightly greater on the left.  C3-C4: Diffuse disc bulge with bilateral uncovertebral spurring, left greater than right. Broad posterior disc osteophyte flattens the ventral thecal sac and results in mild spinal stenosis. Mild cord flattening without cord signal changes. Moderate left with mild right C4 foraminal narrowing.  C4-C5: Chronic circumferential disc osteophyte. Broad posterior component flattens and largely effaces the ventral thecal sac. Mild to moderate spinal stenosis. Mild cord flattening without cord signal changes. Bilateral facet hypertrophy, greater on the left. Severe left with moderate right C5 foraminal narrowing.  C5-C6: Chronic circumferential degenerative disc osteophyte with intervertebral disc space narrowing. Broad posterior component flattens the ventral thecal sac, slightly worse on the right. Facet and ligament flavum hypertrophy. Moderate spinal stenosis with mild cord flattening. No cord signal changes. Severe bilateral C6 foraminal stenosis, right worse than left.  C6-C7: Diffuse degenerative disc osteophyte with intervertebral disc space narrowing. Broad posterior component flattens and effaces the ventral thecal sac with resultant mild to moderate spinal stenosis. Superimposed mild facet and ligament flavum hypertrophy. Severe bilateral C7 foraminal narrowing.  C7-T1: Mild diffuse disc bulge. Left-sided uncovertebral hypertrophy. Mild facet hypertrophy. No significant canal stenosis. Mild bilateral C8 foraminal narrowing.  MRI THORACIC SPINE FINDINGS  Alignment: Vertebral bodies normally aligned with preservation of the normal thoracic kyphosis. No listhesis or subluxation.  Vertebrae: Chronic height loss with superimposed endplate Schmorl's node at the superior endplate of E45 measures up to 50%  without bony retropulsion. Mild chronic height loss at the superior endplate of W09 also chronic. These are benign/osteoporotic in appearance. No evidence for acute or chronic  fracture. Bone marrow signal intensity within normal limits. Few small benign hemangiomas noted, most notable within the T5 and T7 vertebral bodies. No worrisome osseous lesions.  Cord: Signal intensity within the thoracic spinal cord is normal. Conus medullaris terminates below the T12 level.  Paraspinal and other soft tissues: Paraspinous soft tissues within normal limits. Visualized lungs are clear. Visualized visceral structures unremarkable.  Disc levels:  T3-4: Normal disc. Focal ligamentum flavum thickening at the central aspect of the posterior thecal sac. Resultant mild spinal stenosis.  T4-5: Normal disc. Posterior ligamentum flavum thickening on the left. No significant spinal stenosis.  T9-10: Central disc protrusion indents the ventral thecal sac, abutting the ventral cord without cord deformity or cord signal changes (series 15, image 29). Mild spinal stenosis. Foramina remain patent.  T10-11: Minimal disc bulge. Posterior element hypertrophy. Mild spinal stenosis.  MRI LUMBAR SPINE FINDINGS  Segmentation: Normal segmentation. Lowest well-formed disc labeled the L5-S1 level.  Alignment: Trace retrolisthesis of L1 on L2 and L2 on L3, likely chronic and degenerative. Vertebral bodies otherwise normally aligned with preservation of the normal lumbar lordosis.  Vertebrae: Chronic compression deformity involving the superior endplate of L4 with associated Schmorl's node, chronic and benign in appearance. Degenerative Schmorl's node at the superior endplate of L5 with associated mild height loss on the right. Vertebral body heights otherwise maintained. No evidence for acute or subacute fracture. Bone marrow signal intensity within normal limits. No worrisome osseous lesions.  Conus  medullaris and cauda equina: Conus extends to the L1-2 level. Conus and cauda equina appear normal.  Paraspinal and other soft tissues: Paraspinous soft tissues within normal limits. Visualized visceral structures are normal.  Disc levels:  L1-2: Trace retrolisthesis. Mild diffuse disc bulge with disc desiccation. Mild facet and ligamentum flavum hypertrophy. No stenosis or impingement.  L2-3: Trace retrolisthesis. Mild diffuse disc bulge with disc desiccation. Mild bilateral facet and ligamentum flavum hypertrophy. No significant canal or lateral recess stenosis. Mild bilateral L2 foraminal narrowing. No frank impingement.  L3-4: Diffuse disc bulge with disc desiccation and intervertebral disc space narrowing. Moderate facet and ligament flavum hypertrophy. No significant canal stenosis. Mild-to-moderate bilateral L3 foraminal narrowing related to disc bulge and facet hypertrophy, worse on the right.  L4-5: Diffuse circumferential disc bulge with disc desiccation and intervertebral disc space narrowing. Moderate facet and ligament flavum hypertrophy, slightly worse on the right. Resultant mild canal with left lateral recess narrowing, potentially affecting the descending L5 nerve root, relatively similar to previous. Mild left with mild to moderate right L4 foraminal narrowing, similar to previous.  L5-S1: Mild disc bulge. No significant stenosis or neural impingement.  IMPRESSION: MRI CERVICAL SPINE IMPRESSION:  1. No acute abnormality within the cervical spine. 2. Multilevel cervical spondylolysis with resultant diffuse mild to moderate spinal stenosis extending from C3-4 through C6-7, most severe at C5-6. 3. Multifactorial degenerative changes with resultant multilevel foraminal narrowing as above. Notable findings include moderate left C4 foraminal stenosis, severe left with moderate right C5 foraminal narrowing, with severe bilateral C6 and C7 foraminal  stenosis. MRI THORACIC SPINE IMPRESSION:  1. No acute abnormality within the thoracic spine. 2. Chronic compression deformities involving the superior endplates of Z66 and Q94, benign/osteoporotic in appearance. 3. Central disc protrusion at T9-10, abutting the ventral thoracic spinal core without cord deformity. 4. Additional fairly mild degenerative changes for patient age as above. MRI LUMBAR SPINE IMPRESSION:  1. No acute abnormality within the lumbar spine. 2. Chronic benign appearing compression deformity involving the superior endplate of L4.  3. Multifactorial degenerative changes at L4-5 with resultant mild canal and left lateral recess narrowing, potentially affecting the descending left L5 nerve root, similar to previous. 4. Mild to moderate bilateral L3 and L4 foraminal stenosis due to disc bulge and facet hypertrophy.   Electronically Signed   By: Jeannine Boga M.D.   On: 07/04/2017 15:35    Cervical CT wo contrast:  Results for orders placed during the hospital encounter of 01/27/15  CT Cervical Spine Wo Contrast   Narrative CLINICAL DATA:  Fall last night, bilateral weakness.  EXAM: CT HEAD WITHOUT CONTRAST  CT CERVICAL SPINE WITHOUT CONTRAST  TECHNIQUE: Multidetector CT imaging of the head and cervical spine was performed following the standard protocol without intravenous contrast. Multiplanar CT image reconstructions of the cervical spine were also generated.  COMPARISON:  None.  FINDINGS: CT HEAD FINDINGS  Soft tissue swelling over the right forehead. No acute intracranial abnormality. Specifically, no hemorrhage, hydrocephalus, mass lesion, acute infarction, or significant intracranial injury. No acute calvarial abnormality. Visualized paranasal sinuses and mastoids clear. Orbital soft tissues unremarkable.  CT CERVICAL SPINE FINDINGS  Mild degenerative disc disease diffusely throughout the cervical spine with disc space narrowing and  spurring. Mild degenerative facet disease bilaterally. Normal alignment. Prevertebral soft tissues are normal.  Mild bilateral multi level neural foraminal narrowing due to uncovertebral spurring. No fracture. No epidural or paraspinal hematoma. Visualized lung apices clear.  IMPRESSION: No acute intracranial abnormality.  Mild degenerative disc and facet disease throughout the cervical spine with mild bilateral multi level neural foraminal narrowing. No acute bony abnormality.   Electronically Signed   By: Rolm Baptise M.D.   On: 01/27/2015 12:04    Cervical DG complete:  Results for orders placed during the hospital encounter of 06/11/17  DG Cervical Spine Complete   Narrative CLINICAL DATA:  Neck pain radiating to lower back. Pre evaluation for spinal cord stimulator.  EXAM: CERVICAL SPINE - COMPLETE 4+ VIEW  COMPARISON:  None.  FINDINGS: There is reversal of the normal cervical lordosis. There is moderate spondylosis throughout the cervical spine. Prevertebral soft tissues are normal. There is mild disc space narrowing at the C5-6 and C6-7 levels. There is moderate-to-severe bilateral neural foraminal narrowing throughout the cervical spine from the C2-3 level to the C7-T1 level. There is uncovertebral joint spurring and facet arthropathy. Atlantoaxial articulations unremarkable.  IMPRESSION: Moderate spondylosis throughout the cervical spine with multilevel disc disease as described.  Severe bilateral multilevel neural foraminal narrowing as described.   Electronically Signed   By: Marin Olp M.D.   On: 06/11/2017 21:55    Thoracic Imaging: Thoracic MR wo contrast:  Results for orders placed during the hospital encounter of 07/04/17  MR THORACIC SPINE WO CONTRAST   Narrative CLINICAL DATA:  Initial evaluation for chronic pain, spinal stenosis, diffuse numbness, worsened over past 2 years.  EXAM: MRI CERVICAL, THORACIC AND LUMBAR SPINE WITHOUT  CONTRAST  TECHNIQUE: Multiplanar and multiecho pulse sequences of the cervical spine, to include the craniocervical junction and cervicothoracic junction, and thoracic and lumbar spine, were obtained without intravenous contrast.  COMPARISON:  Comparison made with prior radiograph from 06/11/2017 as well as previous MRI from 01/27/2015.  FINDINGS: MRI CERVICAL SPINE FINDINGS  Alignment: Smooth reversal of the normal cervical lordosis. No listhesis or malalignment.  Vertebrae: Vertebral body heights are maintained without evidence for acute or chronic fracture. Signal intensity within the vertebral body bone marrow within normal limits. No discrete or worrisome osseous lesions. No abnormal marrow edema.  Cord: Signal intensity within the cervical spinal cord is normal.  Posterior Fossa, vertebral arteries, paraspinal tissues: Visualized brain and posterior fossa within normal limits. Craniocervical junction normal. Paraspinous and prevertebral soft tissues within normal limits. Normal intravascular flow voids present within the vertebral arteries bilaterally.  Disc levels:  C2-C3: Mild annular disc bulge with bilateral uncovertebral hypertrophy. Moderate bilateral facet degeneration. Mild flattening of the ventral thecal sac without significant spinal stenosis. Mild bilateral C3 foraminal narrowing, slightly greater on the left.  C3-C4: Diffuse disc bulge with bilateral uncovertebral spurring, left greater than right. Broad posterior disc osteophyte flattens the ventral thecal sac and results in mild spinal stenosis. Mild cord flattening without cord signal changes. Moderate left with mild right C4 foraminal narrowing.  C4-C5: Chronic circumferential disc osteophyte. Broad posterior component flattens and largely effaces the ventral thecal sac. Mild to moderate spinal stenosis. Mild cord flattening without cord signal changes. Bilateral facet hypertrophy, greater on the  left. Severe left with moderate right C5 foraminal narrowing.  C5-C6: Chronic circumferential degenerative disc osteophyte with intervertebral disc space narrowing. Broad posterior component flattens the ventral thecal sac, slightly worse on the right. Facet and ligament flavum hypertrophy. Moderate spinal stenosis with mild cord flattening. No cord signal changes. Severe bilateral C6 foraminal stenosis, right worse than left.  C6-C7: Diffuse degenerative disc osteophyte with intervertebral disc space narrowing. Broad posterior component flattens and effaces the ventral thecal sac with resultant mild to moderate spinal stenosis. Superimposed mild facet and ligament flavum hypertrophy. Severe bilateral C7 foraminal narrowing.  C7-T1: Mild diffuse disc bulge. Left-sided uncovertebral hypertrophy. Mild facet hypertrophy. No significant canal stenosis. Mild bilateral C8 foraminal narrowing.  MRI THORACIC SPINE FINDINGS  Alignment: Vertebral bodies normally aligned with preservation of the normal thoracic kyphosis. No listhesis or subluxation.  Vertebrae: Chronic height loss with superimposed endplate Schmorl's node at the superior endplate of I95 measures up to 50% without bony retropulsion. Mild chronic height loss at the superior endplate of J88 also chronic. These are benign/osteoporotic in appearance. No evidence for acute or chronic fracture. Bone marrow signal intensity within normal limits. Few small benign hemangiomas noted, most notable within the T5 and T7 vertebral bodies. No worrisome osseous lesions.  Cord: Signal intensity within the thoracic spinal cord is normal. Conus medullaris terminates below the T12 level.  Paraspinal and other soft tissues: Paraspinous soft tissues within normal limits. Visualized lungs are clear. Visualized visceral structures unremarkable.  Disc levels:  T3-4: Normal disc. Focal ligamentum flavum thickening at the central aspect of the  posterior thecal sac. Resultant mild spinal stenosis.  T4-5: Normal disc. Posterior ligamentum flavum thickening on the left. No significant spinal stenosis.  T9-10: Central disc protrusion indents the ventral thecal sac, abutting the ventral cord without cord deformity or cord signal changes (series 15, image 29). Mild spinal stenosis. Foramina remain patent.  T10-11: Minimal disc bulge. Posterior element hypertrophy. Mild spinal stenosis.  MRI LUMBAR SPINE FINDINGS  Segmentation: Normal segmentation. Lowest well-formed disc labeled the L5-S1 level.  Alignment: Trace retrolisthesis of L1 on L2 and L2 on L3, likely chronic and degenerative. Vertebral bodies otherwise normally aligned with preservation of the normal lumbar lordosis.  Vertebrae: Chronic compression deformity involving the superior endplate of L4 with associated Schmorl's node, chronic and benign in appearance. Degenerative Schmorl's node at the superior endplate of L5 with associated mild height loss on the right. Vertebral body heights otherwise maintained. No evidence for acute or subacute fracture. Bone marrow signal intensity within normal limits. No worrisome osseous  lesions.  Conus medullaris and cauda equina: Conus extends to the L1-2 level. Conus and cauda equina appear normal.  Paraspinal and other soft tissues: Paraspinous soft tissues within normal limits. Visualized visceral structures are normal.  Disc levels:  L1-2: Trace retrolisthesis. Mild diffuse disc bulge with disc desiccation. Mild facet and ligamentum flavum hypertrophy. No stenosis or impingement.  L2-3: Trace retrolisthesis. Mild diffuse disc bulge with disc desiccation. Mild bilateral facet and ligamentum flavum hypertrophy. No significant canal or lateral recess stenosis. Mild bilateral L2 foraminal narrowing. No frank impingement.  L3-4: Diffuse disc bulge with disc desiccation and intervertebral disc space narrowing. Moderate  facet and ligament flavum hypertrophy. No significant canal stenosis. Mild-to-moderate bilateral L3 foraminal narrowing related to disc bulge and facet hypertrophy, worse on the right.  L4-5: Diffuse circumferential disc bulge with disc desiccation and intervertebral disc space narrowing. Moderate facet and ligament flavum hypertrophy, slightly worse on the right. Resultant mild canal with left lateral recess narrowing, potentially affecting the descending L5 nerve root, relatively similar to previous. Mild left with mild to moderate right L4 foraminal narrowing, similar to previous.  L5-S1: Mild disc bulge. No significant stenosis or neural impingement.  IMPRESSION: MRI CERVICAL SPINE IMPRESSION:  1. No acute abnormality within the cervical spine. 2. Multilevel cervical spondylolysis with resultant diffuse mild to moderate spinal stenosis extending from C3-4 through C6-7, most severe at C5-6. 3. Multifactorial degenerative changes with resultant multilevel foraminal narrowing as above. Notable findings include moderate left C4 foraminal stenosis, severe left with moderate right C5 foraminal narrowing, with severe bilateral C6 and C7 foraminal stenosis. MRI THORACIC SPINE IMPRESSION:  1. No acute abnormality within the thoracic spine. 2. Chronic compression deformities involving the superior endplates of W97 and V15, benign/osteoporotic in appearance. 3. Central disc protrusion at T9-10, abutting the ventral thoracic spinal core without cord deformity. 4. Additional fairly mild degenerative changes for patient age as above. MRI LUMBAR SPINE IMPRESSION:  1. No acute abnormality within the lumbar spine. 2. Chronic benign appearing compression deformity involving the superior endplate of L4. 3. Multifactorial degenerative changes at L4-5 with resultant mild canal and left lateral recess narrowing, potentially affecting the descending left L5 nerve root, similar to  previous. 4. Mild to moderate bilateral L3 and L4 foraminal stenosis due to disc bulge and facet hypertrophy.   Electronically Signed   By: Jeannine Boga M.D.   On: 07/04/2017 15:35    Thoracic DG 2-3 views:  Results for orders placed during the hospital encounter of 06/11/17  DG Thoracic Spine 2 View   Narrative CLINICAL DATA:  Degenerate disc disease with neck pain radiating to low back. Pre evaluation for spinal cord stimulator.  EXAM: THORACIC SPINE 2 VIEWS  COMPARISON:  MRI thoracic spine 01/27/2015.  FINDINGS: Vertebral body alignment is within normal. Pedicles are intact. There is mild spondylosis throughout the thoracic spine. There is mild anterior wedging of T11 and T12 unchanged. No acute fracture or subluxation.  IMPRESSION: No acute findings.  Mild spondylosis of the thoracic spine. Anterior wedge compression deformities of T11 and T12 unchanged.   Electronically Signed   By: Marin Olp M.D.   On: 06/11/2017 21:57    Lumbosacral Imaging: Lumbar MR wo contrast:  Results for orders placed during the hospital encounter of 07/04/17  MR LUMBAR SPINE WO CONTRAST   Narrative CLINICAL DATA:  Initial evaluation for chronic pain, spinal stenosis, diffuse numbness, worsened over past 2 years.  EXAM: MRI CERVICAL, THORACIC AND LUMBAR SPINE WITHOUT CONTRAST  TECHNIQUE: Multiplanar  and multiecho pulse sequences of the cervical spine, to include the craniocervical junction and cervicothoracic junction, and thoracic and lumbar spine, were obtained without intravenous contrast.  COMPARISON:  Comparison made with prior radiograph from 06/11/2017 as well as previous MRI from 01/27/2015.  FINDINGS: MRI CERVICAL SPINE FINDINGS  Alignment: Smooth reversal of the normal cervical lordosis. No listhesis or malalignment.  Vertebrae: Vertebral body heights are maintained without evidence for acute or chronic fracture. Signal intensity within the  vertebral body bone marrow within normal limits. No discrete or worrisome osseous lesions. No abnormal marrow edema.  Cord: Signal intensity within the cervical spinal cord is normal.  Posterior Fossa, vertebral arteries, paraspinal tissues: Visualized brain and posterior fossa within normal limits. Craniocervical junction normal. Paraspinous and prevertebral soft tissues within normal limits. Normal intravascular flow voids present within the vertebral arteries bilaterally.  Disc levels:  C2-C3: Mild annular disc bulge with bilateral uncovertebral hypertrophy. Moderate bilateral facet degeneration. Mild flattening of the ventral thecal sac without significant spinal stenosis. Mild bilateral C3 foraminal narrowing, slightly greater on the left.  C3-C4: Diffuse disc bulge with bilateral uncovertebral spurring, left greater than right. Broad posterior disc osteophyte flattens the ventral thecal sac and results in mild spinal stenosis. Mild cord flattening without cord signal changes. Moderate left with mild right C4 foraminal narrowing.  C4-C5: Chronic circumferential disc osteophyte. Broad posterior component flattens and largely effaces the ventral thecal sac. Mild to moderate spinal stenosis. Mild cord flattening without cord signal changes. Bilateral facet hypertrophy, greater on the left. Severe left with moderate right C5 foraminal narrowing.  C5-C6: Chronic circumferential degenerative disc osteophyte with intervertebral disc space narrowing. Broad posterior component flattens the ventral thecal sac, slightly worse on the right. Facet and ligament flavum hypertrophy. Moderate spinal stenosis with mild cord flattening. No cord signal changes. Severe bilateral C6 foraminal stenosis, right worse than left.  C6-C7: Diffuse degenerative disc osteophyte with intervertebral disc space narrowing. Broad posterior component flattens and effaces the ventral thecal sac with resultant  mild to moderate spinal stenosis. Superimposed mild facet and ligament flavum hypertrophy. Severe bilateral C7 foraminal narrowing.  C7-T1: Mild diffuse disc bulge. Left-sided uncovertebral hypertrophy. Mild facet hypertrophy. No significant canal stenosis. Mild bilateral C8 foraminal narrowing.  MRI THORACIC SPINE FINDINGS  Alignment: Vertebral bodies normally aligned with preservation of the normal thoracic kyphosis. No listhesis or subluxation.  Vertebrae: Chronic height loss with superimposed endplate Schmorl's node at the superior endplate of J18 measures up to 50% without bony retropulsion. Mild chronic height loss at the superior endplate of A41 also chronic. These are benign/osteoporotic in appearance. No evidence for acute or chronic fracture. Bone marrow signal intensity within normal limits. Few small benign hemangiomas noted, most notable within the T5 and T7 vertebral bodies. No worrisome osseous lesions.  Cord: Signal intensity within the thoracic spinal cord is normal. Conus medullaris terminates below the T12 level.  Paraspinal and other soft tissues: Paraspinous soft tissues within normal limits. Visualized lungs are clear. Visualized visceral structures unremarkable.  Disc levels:  T3-4: Normal disc. Focal ligamentum flavum thickening at the central aspect of the posterior thecal sac. Resultant mild spinal stenosis.  T4-5: Normal disc. Posterior ligamentum flavum thickening on the left. No significant spinal stenosis.  T9-10: Central disc protrusion indents the ventral thecal sac, abutting the ventral cord without cord deformity or cord signal changes (series 15, image 29). Mild spinal stenosis. Foramina remain patent.  T10-11: Minimal disc bulge. Posterior element hypertrophy. Mild spinal stenosis.  MRI LUMBAR SPINE FINDINGS  Segmentation: Normal segmentation. Lowest well-formed disc labeled the L5-S1 level.  Alignment: Trace retrolisthesis of L1  on L2 and L2 on L3, likely chronic and degenerative. Vertebral bodies otherwise normally aligned with preservation of the normal lumbar lordosis.  Vertebrae: Chronic compression deformity involving the superior endplate of L4 with associated Schmorl's node, chronic and benign in appearance. Degenerative Schmorl's node at the superior endplate of L5 with associated mild height loss on the right. Vertebral body heights otherwise maintained. No evidence for acute or subacute fracture. Bone marrow signal intensity within normal limits. No worrisome osseous lesions.  Conus medullaris and cauda equina: Conus extends to the L1-2 level. Conus and cauda equina appear normal.  Paraspinal and other soft tissues: Paraspinous soft tissues within normal limits. Visualized visceral structures are normal.  Disc levels:  L1-2: Trace retrolisthesis. Mild diffuse disc bulge with disc desiccation. Mild facet and ligamentum flavum hypertrophy. No stenosis or impingement.  L2-3: Trace retrolisthesis. Mild diffuse disc bulge with disc desiccation. Mild bilateral facet and ligamentum flavum hypertrophy. No significant canal or lateral recess stenosis. Mild bilateral L2 foraminal narrowing. No frank impingement.  L3-4: Diffuse disc bulge with disc desiccation and intervertebral disc space narrowing. Moderate facet and ligament flavum hypertrophy. No significant canal stenosis. Mild-to-moderate bilateral L3 foraminal narrowing related to disc bulge and facet hypertrophy, worse on the right.  L4-5: Diffuse circumferential disc bulge with disc desiccation and intervertebral disc space narrowing. Moderate facet and ligament flavum hypertrophy, slightly worse on the right. Resultant mild canal with left lateral recess narrowing, potentially affecting the descending L5 nerve root, relatively similar to previous. Mild left with mild to moderate right L4 foraminal narrowing, similar to previous.  L5-S1:  Mild disc bulge. No significant stenosis or neural impingement.  IMPRESSION: MRI CERVICAL SPINE IMPRESSION:  1. No acute abnormality within the cervical spine. 2. Multilevel cervical spondylolysis with resultant diffuse mild to moderate spinal stenosis extending from C3-4 through C6-7, most severe at C5-6. 3. Multifactorial degenerative changes with resultant multilevel foraminal narrowing as above. Notable findings include moderate left C4 foraminal stenosis, severe left with moderate right C5 foraminal narrowing, with severe bilateral C6 and C7 foraminal stenosis. MRI THORACIC SPINE IMPRESSION:  1. No acute abnormality within the thoracic spine. 2. Chronic compression deformities involving the superior endplates of R94 and V85, benign/osteoporotic in appearance. 3. Central disc protrusion at T9-10, abutting the ventral thoracic spinal core without cord deformity. 4. Additional fairly mild degenerative changes for patient age as above. MRI LUMBAR SPINE IMPRESSION:  1. No acute abnormality within the lumbar spine. 2. Chronic benign appearing compression deformity involving the superior endplate of L4. 3. Multifactorial degenerative changes at L4-5 with resultant mild canal and left lateral recess narrowing, potentially affecting the descending left L5 nerve root, similar to previous. 4. Mild to moderate bilateral L3 and L4 foraminal stenosis due to disc bulge and facet hypertrophy.   Electronically Signed   By: Jeannine Boga M.D.   On: 07/04/2017 15:35    Lumbar DG (Complete) 4+V:  Results for orders placed during the hospital encounter of 01/27/15  DG Lumbar Spine Complete   Narrative CLINICAL DATA:  Fall.  Lower extremity weakness.  EXAM: LUMBAR SPINE - COMPLETE 4+ VIEW  COMPARISON:  None.  FINDINGS: No fracture of the lumbar spine. No spondylolisthesis. Mild loss of disc height from the lower thoracic spine through L4-L5. There are small endplate  osteophytes.  Soft tissues are unremarkable.  IMPRESSION: No fracture or acute finding.   Electronically Signed  By: Lajean Manes M.D.   On: 01/27/2015 13:36    Lumbar DG Bending views:  Results for orders placed during the hospital encounter of 06/11/17  DG Lumbar Spine Complete W/Bend   Narrative CLINICAL DATA:  Degenerate disc disease and compression fracture. Neck pain radiating to low back. Pre evaluation for spinal cord stimulator.  EXAM: LUMBAR SPINE - COMPLETE WITH BENDING VIEWS  COMPARISON:  01/27/2015  FINDINGS: Examination demonstrates mild to moderate spondylosis of the lumbar spine to include moderate facet arthropathy over the mid to lower lumbar spine. Vertebral body alignment is within normal. There is mild interval progression of superior endplate compression of L4. There is mild anterior wedging of T11 and T12 slightly worse. There is disc space narrowing at the L3-4 and L4-5 levels. No evidence of instability upon flexion and extension.  IMPRESSION: Mild-to-moderate spondylosis of the lumbar spine with mild disc disease at the L3-4 and L4-5 levels.  Interval progression of superior endplate compression of L4. Mild anterior wedging of T11 and T12 slightly worse.   Electronically Signed   By: Marin Olp M.D.   On: 06/11/2017 21:53    Foot Imaging: Foot-R DG Complete:  Results for orders placed in visit on 05/30/15  DG Foot Complete Right   Narrative 3 views of the right foot demonstrates moderate to severe osteoarthritic  changes with Lisfranc's joints right foot with multiple fractures which  have gone on to heal second third fourth metatarsal necks.   Foot-L DG Complete:  Results for orders placed in visit on 05/30/15  DG Foot Complete Left   Narrative 3 views of the left produces osseously mature foot with what appears to be  an old Fleck fracture and Lisfranc separation base of the second  metatarsal. There appears to be some  degenerative changes to the head of  the first metatarsal at the level of the metatarsophalangeal joint.   Complexity Note: Imaging results reviewed. Results shared with Mr. Hakeem, using Layman's terms.                         Meds   Current Outpatient Medications:  .  baclofen (LIORESAL) 10 MG tablet, TAKE 1 TABLET(10 MG) BY MOUTH TWICE DAILY, Disp: 180 tablet, Rfl: 3 .  Cholecalciferol (RA VITAMIN D-3) 2000 UNITS CAPS, Take 1 capsule by mouth daily. , Disp: , Rfl:  .  Cholecalciferol (VITAMIN D3) 5000 units CAPS, Take 1 capsule by mouth daily., Disp: , Rfl:  .  clonazePAM (KLONOPIN) 0.5 MG tablet, Take 0.5 mg by mouth daily., Disp: , Rfl:  .  diclofenac (VOLTAREN) 50 MG EC tablet, Take 1 tablet (50 mg total) by mouth 3 (three) times daily., Disp: 270 tablet, Rfl: 3 .  docusate sodium (COLACE) 100 MG capsule, Take 100 mg by mouth 2 (two) times daily., Disp: , Rfl:  .  gabapentin (NEURONTIN) 600 MG tablet, TAKE 1 AND 1/2 TABLETS(900 MG) BY MOUTH THREE TIMES DAILY, Disp: 405 tablet, Rfl: 3 .  lisinopril (PRINIVIL,ZESTRIL) 5 MG tablet, TAKE 1 TABLET(5 MG) BY MOUTH DAILY, Disp: 90 tablet, Rfl: 3 .  loratadine (CLARITIN) 10 MG tablet, Take 10 mg by mouth daily., Disp: , Rfl:  .  morphine (MS CONTIN) 60 MG 12 hr tablet, Take 1 tablet (60 mg total) by mouth 3 (three) times daily., Disp: 90 tablet, Rfl: 0 .  nystatin ointment (MYCOSTATIN), Apply 1 application topically 2 (two) times daily., Disp: 30 g, Rfl: 0  ROS  Constitutional:  Denies any fever or chills Gastrointestinal: No reported hemesis, hematochezia, vomiting, or acute GI distress Musculoskeletal: Denies any acute onset joint swelling, redness, loss of ROM, or weakness Neurological: No reported episodes of acute onset apraxia, aphasia, dysarthria, agnosia, amnesia, paralysis, loss of coordination, or loss of consciousness  Allergies  Mr. Bachtel is allergic to bee venom and poison ivy extract [poison ivy extract].  PFSH  Drug:  Mr. Doescher  reports that he does not use drugs. Alcohol:  reports that he does not drink alcohol. Tobacco:  reports that he quit smoking about 6 years ago. He has never used smokeless tobacco. Medical:  has a past medical history of Frequent headaches, History of colon polyps, Hypertension, and Spinal stenosis. Surgical: Mr. Cowher  has a past surgical history that includes Cholecystectomy (2015). Family: family history includes Alcohol abuse in his father; Cancer in his mother; Heart disease in his brother and father; Hypertension in his brother.  Constitutional Exam  General appearance: Well nourished, well developed, and well hydrated. In no apparent acute distress Vitals:   12/01/17 1112  BP: 125/83  Pulse: 85  Resp: 18  Temp: 98.2 F (36.8 C)  SpO2: 98%  Weight: 190 lb (86.2 kg)  Height: _0  (1.676 m)   BMI Assessment: Estimated body mass index is 30.67 kg/m as calculated from the following:   Height as of this encounter: _1  (1.676 m).   Weight as of this encounter: 190 lb (86.2 kg).  BMI interpretation table: BMI level Category Range association with higher incidence of chronic pain  <18 kg/m2 Underweight   18.5-24.9 kg/m2 Ideal body weight   25-29.9 kg/m2 Overweight Increased incidence by 20%  30-34.9 kg/m2 Obese (Class I) Increased incidence by 68%  35-39.9 kg/m2 Severe obesity (Class II) Increased incidence by 136%  >40 kg/m2 Extreme obesity (Class III) Increased incidence by 254%   Patient's current BMI Ideal Body weight  Body mass index is 30.67 kg/m. Ideal body weight: 63.8 kg (140 lb 10.5 oz) Adjusted ideal body weight: 72.8 kg (160 lb 6.3 oz)   BMI Readings from Last 4 Encounters:  12/01/17 30.67 kg/m  06/11/17 29.21 kg/m  05/27/17 30.34 kg/m  05/01/17 30.34 kg/m   Wt Readings from Last 4 Encounters:  12/01/17 190 lb (86.2 kg)  06/11/17 181 lb (82.1 kg)  05/27/17 188 lb (85.3 kg)  05/01/17 188 lb (85.3 kg)  Psych/Mental status: Alert,  oriented x 3 (person, place, & time)       Eyes: PERLA Respiratory: No evidence of acute respiratory distress  Cervical Spine Area Exam  Skin & Axial Inspection: No masses, redness, edema, swelling, or associated skin lesions Alignment: Asymmetric Functional ROM: Minimal ROM, bilaterally Stability: No instability detected Muscle Tone/Strength: Guarding observed Sensory (Neurological): Movement-associated pain Palpation: Complains of area being tender to palpation              Upper Extremity (UE) Exam    Side: Right upper extremity  Side: Left upper extremity  Skin & Extremity Inspection: Skin color, temperature, and hair growth are WNL. No peripheral edema or cyanosis. No masses, redness, swelling, asymmetry, or associated skin lesions. No contractures.  Skin & Extremity Inspection: Skin color, temperature, and hair growth are WNL. No peripheral edema or cyanosis. No masses, redness, swelling, asymmetry, or associated skin lesions. No contractures.  Functional ROM: Unrestricted ROM          Functional ROM: Unrestricted ROM          Muscle Tone/Strength: Functionally intact.  No obvious neuro-muscular anomalies detected.  Muscle Tone/Strength: Functionally intact. No obvious neuro-muscular anomalies detected.  Sensory (Neurological): Unimpaired          Sensory (Neurological): Unimpaired          Palpation: No palpable anomalies              Palpation: No palpable anomalies              Specialized Test(s): Deferred         Specialized Test(s): Deferred          Thoracic Spine Area Exam  Skin & Axial Inspection: No masses, redness, or swelling Alignment: Symmetrical Functional ROM: Unrestricted ROM Stability: No instability detected Muscle Tone/Strength: Functionally intact. No obvious neuro-muscular anomalies detected. Sensory (Neurological): Unimpaired Muscle strength & Tone: No palpable anomalies  Lumbar Spine Area Exam  Skin & Axial Inspection: No masses, redness, or  swelling Alignment: Symmetrical Functional ROM: Decreased ROM       Stability: No instability detected Muscle Tone/Strength: Functionally intact. No obvious neuro-muscular anomalies detected. Sensory (Neurological): Movement-associated pain Palpation: Complains of area being tender to palpation       Provocative Tests: Lumbar Hyperextension and rotation test: Positive bilaterally for facet joint pain. Lumbar Lateral bending test: evaluation deferred today       Patrick's Maneuver: evaluation deferred today                    Gait & Posture Assessment  Ambulation: Patient ambulates using a walker Gait: Significantly limited. Dependent on assistive device to ambulate Posture: Antalgic   Lower Extremity Exam    Side: Right lower extremity  Side: Left lower extremity  Stability: No instability observed          Stability: No instability observed          Skin & Extremity Inspection: Skin color, temperature, and hair growth are WNL. No peripheral edema or cyanosis. No masses, redness, swelling, asymmetry, or associated skin lesions. No contractures.  Skin & Extremity Inspection: Skin color, temperature, and hair growth are WNL. No peripheral edema or cyanosis. No masses, redness, swelling, asymmetry, or associated skin lesions. No contractures.  Functional ROM: Unrestricted ROM                  Functional ROM: Unrestricted ROM                  Muscle Tone/Strength: Functionally intact. No obvious neuro-muscular anomalies detected.  Muscle Tone/Strength: Functionally intact. No obvious neuro-muscular anomalies detected.  Sensory (Neurological): Unimpaired  Sensory (Neurological): Unimpaired  Palpation: No palpable anomalies  Palpation: No palpable anomalies   Assessment & Plan  Primary Diagnosis & Pertinent Problem List: The primary encounter diagnosis was Chronic thoracic back pain (Midline) (Primary Area of Pain). Diagnoses of Chronic upper back pain (Secondary Area of Pain) (Midline)  (Bilateral) (L>R), Chronic foot pain (Tertiary Area of Pain) (Bilateral) (L>R), Cervicogenic headache (Fourth Area of Pain) (Bilateral) (R>L), Occipital headache (Fourth Area of Pain) (Bilateral) (R>L), Chronic thigh pain (Fifth Area of Pain) (Bilateral) (R>L), Chronic pain syndrome, and Opiate use (180 MME/day) were also pertinent to this visit.  Visit Diagnosis: 1. Chronic thoracic back pain (Midline) (Primary Area of Pain)   2. Chronic upper back pain (Secondary Area of Pain) (Midline) (Bilateral) (L>R)   3. Chronic foot pain (Tertiary Area of Pain) (Bilateral) (L>R)   4. Cervicogenic headache (Fourth Area of Pain) (Bilateral) (R>L)   5. Occipital headache (Fourth Area of Pain) (Bilateral) (  R>L)   6. Chronic thigh pain (Fifth Area of Pain) (Bilateral) (R>L)   7. Chronic pain syndrome   8. Opiate use (180 MME/day)    Problems updated and reviewed during this visit: No problems updated.  Plan of Care  Pharmacotherapy (Medications Ordered): No orders of the defined types were placed in this encounter.  Procedure Orders    No procedure(s) ordered today   Lab Orders  No laboratory test(s) ordered today   Imaging Orders  No imaging studies ordered today   Referral Orders  No referral(s) requested today    Pharmacological management options:  Opioid Analgesics: I will not be prescribing any opioids at this time Membrane stabilizer: I will not be prescribing any at this time Muscle relaxant: I will not be prescribing any at this time NSAID: I will not be prescribing any at this time Other analgesic(s): None prescribed at this time   Interventional management options: Planned, scheduled, and/or pending:    None at this time.   Considering:   Diagnostic intrathecal morphine pump trial  Possible intrathecal pump implant  Diagnostic left L4-5 lumbar epidural steroid injection  Diagnostic bilateral lumbar facet block  Possible bilateral lumbar facet RFA  Diagnostic bilateral  cervical facet block  Possible bilateral cervical facet RFA  Diagnostic thoracic epidural steroid injection    PRN Procedures:   None at this time   Provider-requested follow-up: Return for The patient will call when ready..  Future Appointments  Date Time Provider [place]  05/04/2018  2:30 PM Jearld Fenton, NP [facility]    Primary Care Physician: Jearld Fenton, NP Location: [facility] Note by: Gaspar Cola, MD Date: 12/01/2017; Time: 1:34 PM

## 2017-12-01 NOTE — Patient Instructions (Addendum)
____________________________________________________________________________________________  Pain Scale  Introduction: The pain score used by this practice is the Verbal Numerical Rating Scale (VNRS-11). This is an 11-point scale. It is for adults and children 10 years or older. There are significant differences in how the pain score is reported, used, and applied. Forget everything you learned in the past and learn this scoring system.  General Information: The scale should reflect your current level of pain. Unless you are specifically asked for the level of your worst pain, or your average pain. If you are asked for one of these two, then it should be understood that it is over the past 24 hours.  Basic Activities of Daily Living (ADL): Personal hygiene, dressing, eating, transferring, and using restroom.  Instructions: Most patients tend to report their level of pain as a combination of two factors, their physical pain and their psychosocial pain. This last one is also known as "suffering" and it is reflection of how physical pain affects you socially and psychologically. From now on, report them separately. From this point on, when asked to report your pain level, report only your physical pain. Use the following table for reference.  Pain Clinic Pain Levels (0-5/10)  Pain Level Score  Description  No Pain 0   Mild pain 1 Nagging, annoying, but does not interfere with basic activities of daily living (ADL). Patients are able to eat, bathe, get dressed, toileting (being able to get on and off the toilet and perform personal hygiene functions), transfer (move in and out of bed or a chair without assistance), and maintain continence (able to control bladder and bowel functions). Blood pressure and heart rate are unaffected. A normal heart rate for a healthy adult ranges from 60 to 100 bpm (beats per minute).   Mild to moderate pain 2 Noticeable and distracting. Impossible to hide from other  people. More frequent flare-ups. Still possible to adapt and function close to normal. It can be very annoying and may have occasional stronger flare-ups. With discipline, patients may get used to it and adapt.   Moderate pain 3 Interferes significantly with activities of daily living (ADL). It becomes difficult to feed, bathe, get dressed, get on and off the toilet or to perform personal hygiene functions. Difficult to get in and out of bed or a chair without assistance. Very distracting. With effort, it can be ignored when deeply involved in activities.   Moderately severe pain 4 Impossible to ignore for more than a few minutes. With effort, patients may still be able to manage work or participate in some social activities. Very difficult to concentrate. Signs of autonomic nervous system discharge are evident: dilated pupils (mydriasis); mild sweating (diaphoresis); sleep interference. Heart rate becomes elevated (>115 bpm). Diastolic blood pressure (lower number) rises above 100 mmHg. Patients find relief in laying down and not moving.   Severe pain 5 Intense and extremely unpleasant. Associated with frowning face and frequent crying. Pain overwhelms the senses.  Ability to do any activity or maintain social relationships becomes significantly limited. Conversation becomes difficult. Pacing back and forth is common, as getting into a comfortable position is nearly impossible. Pain wakes you up from deep sleep. Physical signs will be obvious: pupillary dilation; increased sweating; goosebumps; brisk reflexes; cold, clammy hands and feet; nausea, vomiting or dry heaves; loss of appetite; significant sleep disturbance with inability to fall asleep or to remain asleep. When persistent, significant weight loss is observed due to the complete loss of appetite and sleep deprivation.  Blood   pressure and heart rate becomes significantly elevated. Caution: If elevated blood pressure triggers a pounding headache  associated with blurred vision, then the patient should immediately seek attention at an urgent or emergency care unit, as these may be signs of an impending stroke.    Emergency Department Pain Levels (6-10/10)  Emergency Room Pain 6 Severely limiting. Requires emergency care and should not be seen or managed at an outpatient pain management facility. Communication becomes difficult and requires great effort. Assistance to reach the emergency department may be required. Facial flushing and profuse sweating along with potentially dangerous increases in heart rate and blood pressure will be evident.   Distressing pain 7 Self-care is very difficult. Assistance is required to transport, or use restroom. Assistance to reach the emergency department will be required. Tasks requiring coordination, such as bathing and getting dressed become very difficult.   Disabling pain 8 Self-care is no longer possible. At this level, pain is disabling. The individual is unable to do even the most "basic" activities such as walking, eating, bathing, dressing, transferring to a bed, or toileting. Fine motor skills are lost. It is difficult to think clearly.   Incapacitating pain 9 Pain becomes incapacitating. Thought processing is no longer possible. Difficult to remember your own name. Control of movement and coordination are lost.   The worst pain imaginable 10 At this level, most patients pass out from pain. When this level is reached, collapse of the autonomic nervous system occurs, leading to a sudden drop in blood pressure and heart rate. This in turn results in a temporary and dramatic drop in blood flow to the brain, leading to a loss of consciousness. Fainting is one of the body's self defense mechanisms. Passing out puts the brain in a calmed state and causes it to shut down for a while, in order to begin the healing process.    Summary: 1. Refer to this scale when providing [place] with your pain level. 2. Be  accurate and careful when reporting your pain level. This will help with your care. 3. Over-reporting your pain level will lead to loss of credibility. 4. Even a level of 1/10 means that there is pain and will be treated at our facility. 5. High, inaccurate reporting will be documented as "Symptom Exaggeration", leading to loss of credibility and suspicions of possible secondary gains such as obtaining more narcotics, or wanting to appear disabled, for fraudulent reasons. 6. Only pain levels of 5 or below will be seen at our facility. 7. Pain levels of 6 and above will be sent to the Emergency Department and the appointment cancelled. ____________________________________________________________________________________________   ____________________________________________________________________________________________  Drug Holidays (Slow)  What is a "Drug Holiday"? Drug Holiday: is the name given to the period of time during which a patient stops taking a medication(s) for the purpose of eliminating tolerance to the drug.  Benefits . Improved effectiveness of opioids. . Decreased opioid dose needed to achieve benefits. . Improved pain with lesser dose.  What is tolerance? Tolerance: is the progressive decreased in effectiveness of a drug due to its repetitive use. With repetitive use, the body gets use to the medication and as a consequence, it loses its effectiveness. This is a common problem seen with opioid pain medications. As a result, a larger dose of the drug is needed to achieve the same effect that used to be obtained with a smaller dose.  How long should a "Drug Holiday" last? At least 14 consecutive days. (2 weeks)  What  are withdrawals? Withdrawals: refers to the wide range of symptoms that occur after stopping or dramatically reducing opiate drugs after heavy and prolonged use. Withdrawal symptoms do not occur to patients that use low dose opioids, or those who take the  medication sporadically. Contrary to benzodiazepine (example: Valium, Xanax, etc.) or alcohol withdrawals ("Delirium Tremens"), opioid withdrawals are not lethal. Withdrawals are the physical manifestation of the body getting rid of the excess receptors.  Expected Symptoms Early symptoms of withdrawal may include: . Agitation . Anxiety . Muscle aches . Increased tearing . Insomnia . Runny nose . Sweating . Yawning  Late symptoms of withdrawal may include: . Abdominal cramping . Diarrhea . Dilated pupils . Goose bumps . Nausea . Vomiting  Will I experience withdrawals? Due to the slow nature of the taper, it is very unlikely that you will experience any.  What is a slow taper? Taper: refers to the gradual decrease in dose. ___________________________________________________________________________________________  ____________________________________________________________________________________________  General Risks and Possible Complications  Patient Responsibilities: It is important that you read this as it is part of your informed consent. It is our duty to inform you of the risks and possible complications associated with treatments offered to you. It is your responsibility as a patient to read this and to ask questions about anything that is not clear or that you believe was not covered in this document.  Patient's Rights: You have the right to refuse treatment. You also have the right to change your mind, even after initially having agreed to have the treatment done. However, under this last option, if you wait until the last second to change your mind, you may be charged for the materials used up to that point.  Introduction: Medicine is not an Chief Strategy Officer. Everything in Medicine, including the lack of treatment(s), carries the potential for danger, harm, or loss (which is by definition: Risk). In Medicine, a complication is a secondary problem, condition, or disease  that can aggravate an already existing one. All treatments carry the risk of possible complications. The fact that a side effects or complications occurs, does not imply that the treatment was conducted incorrectly. It must be clearly understood that these can happen even when everything is done following the highest safety standards.  No treatment: You can choose not to proceed with the proposed treatment alternative. The "PRO(s)" would include: avoiding the risk of complications associated with the therapy. The "CON(s)" would include: not getting any of the treatment benefits. These benefits fall under one of three categories: diagnostic; therapeutic; and/or palliative. Diagnostic benefits include: getting information which can ultimately lead to improvement of the disease or symptom(s). Therapeutic benefits are those associated with the successful treatment of the disease. Finally, palliative benefits are those related to the decrease of the primary symptoms, without necessarily curing the condition (example: decreasing the pain from a flare-up of a chronic condition, such as incurable terminal cancer).  General Risks and Complications: These are associated to most interventional treatments. They can occur alone, or in combination. They fall under one of the following six (6) categories: no benefit or worsening of symptoms; bleeding; infection; nerve damage; allergic reactions; and/or death. 1. No benefits or worsening of symptoms: In Medicine there are no guarantees, only probabilities. No healthcare provider can ever guarantee that a medical treatment will work, they can only state the probability that it may. Furthermore, there is always the possibility that the condition may worsen, either directly, or indirectly, as a consequence of the treatment. 2.  Bleeding: This is more common if the patient is taking a blood thinner, either prescription or over the counter (example: Goody Powders, Fish oil,  Aspirin, Garlic, etc.), or if suffering a condition associated with impaired coagulation (example: Hemophilia, cirrhosis of the liver, low platelet counts, etc.). However, even if you do not have one on these, it can still happen. If you have any of these conditions, or take one of these drugs, make sure to notify your treating physician. 3. Infection: This is more common in patients with a compromised immune system, either due to disease (example: diabetes, cancer, human immunodeficiency virus [HIV], etc.), or due to medications or treatments (example: therapies used to treat cancer and rheumatological diseases). However, even if you do not have one on these, it can still happen. If you have any of these conditions, or take one of these drugs, make sure to notify your treating physician. 4. Nerve Damage: This is more common when the treatment is an invasive one, but it can also happen with the use of medications, such as those used in the treatment of cancer. The damage can occur to small secondary nerves, or to large primary ones, such as those in the spinal cord and brain. This damage may be temporary or permanent and it may lead to impairments that can range from temporary numbness to permanent paralysis and/or brain death. 5. Allergic Reactions: Any time a substance or material comes in contact with our body, there is the possibility of an allergic reaction. These can range from a mild skin rash (contact dermatitis) to a severe systemic reaction (anaphylactic reaction), which can result in death. 6. Death: In general, any medical intervention can result in death, most of the time due to an unforeseen complication. ____________________________________________________________________________________________

## 2017-12-22 IMAGING — CR DG LUMBAR SPINE COMPLETE W/ BEND
7 series · 7 of 7 positions shown · non-contrast
Comparison: 01/27/2015

CLINICAL DATA: Degenerate disc disease and compression fracture.
Neck pain radiating to low back. Pre evaluation for spinal cord
stimulator.

EXAM:
LUMBAR SPINE - COMPLETE WITH BENDING VIEWS

[l-spine ap]
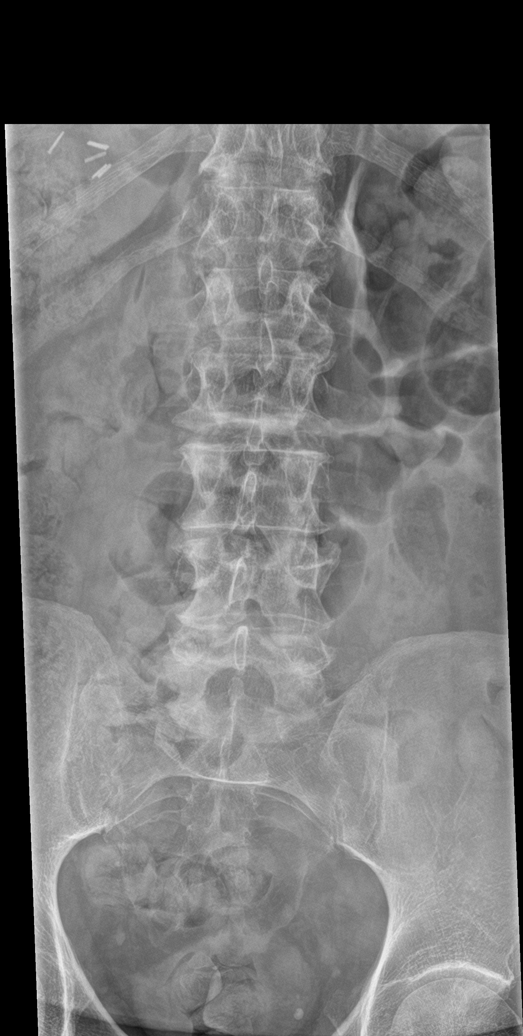

[l-spine obl (1 of 2)]
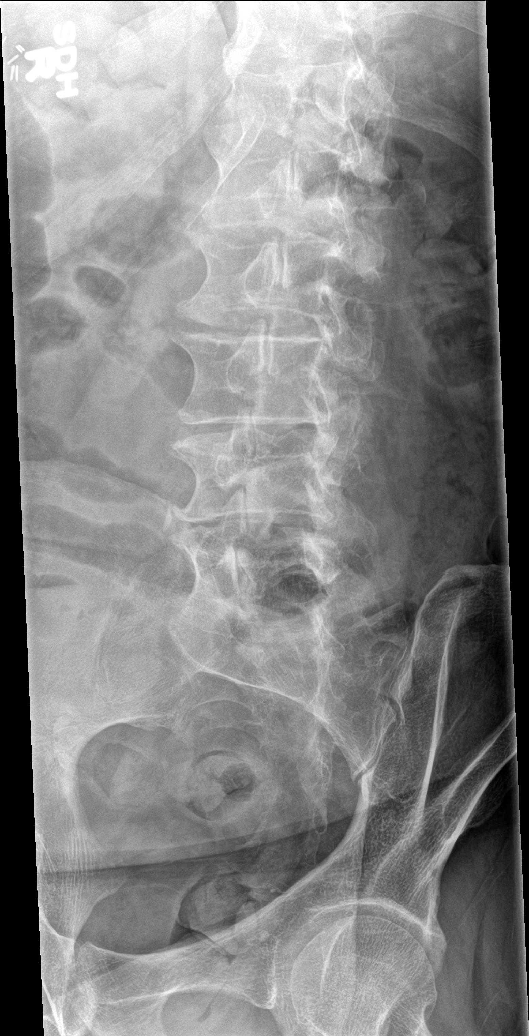

[l-spine obl (2 of 2)]
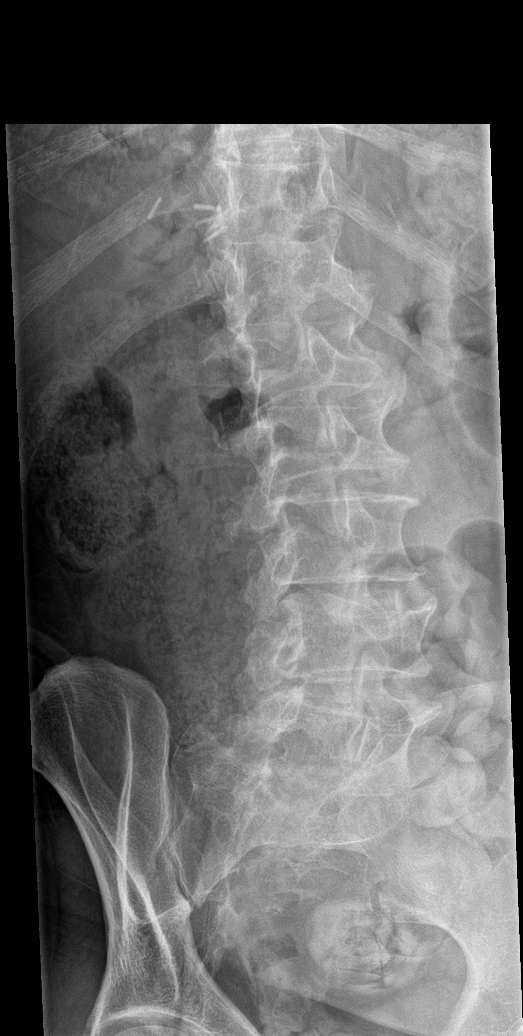

[l-spine lat (1 of 2)]
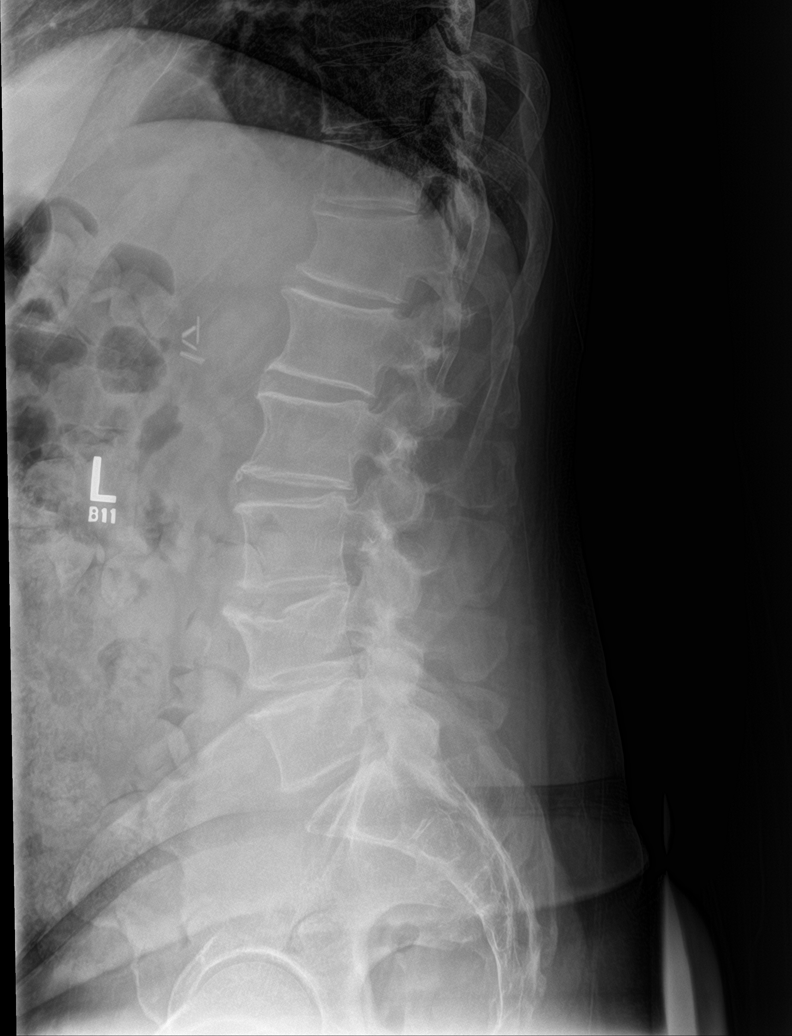

[l-spine lat (2 of 2)]
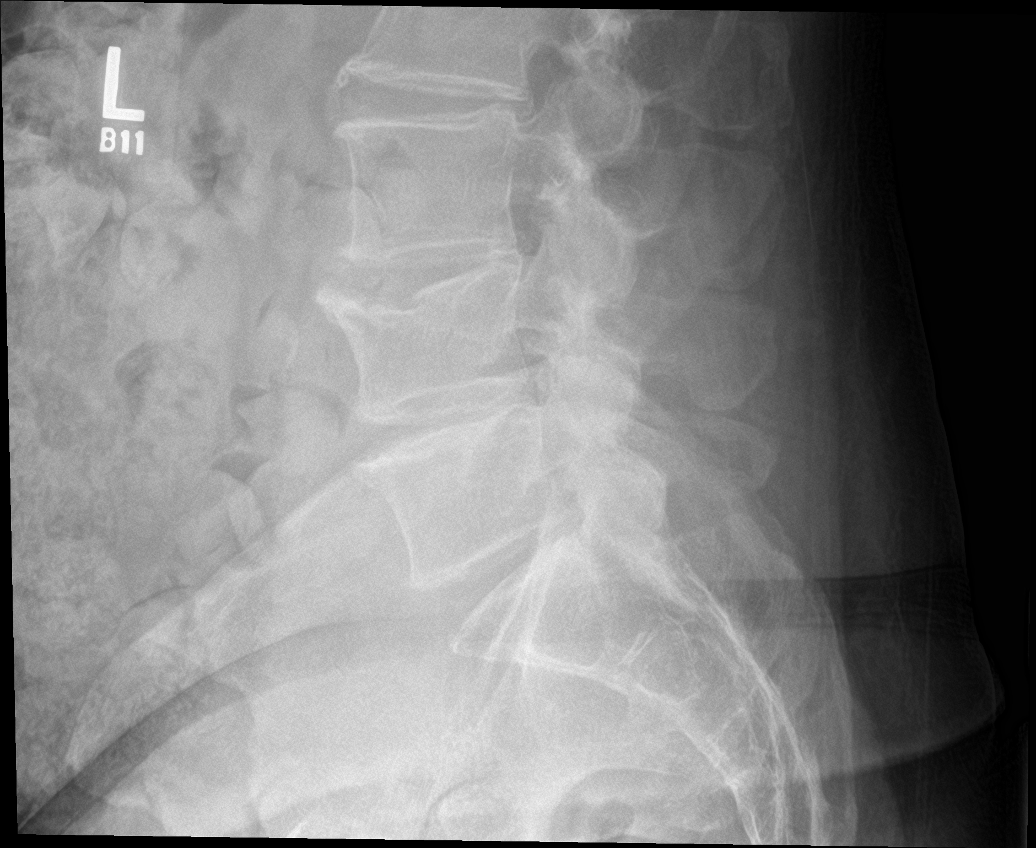

[l-spine flex]
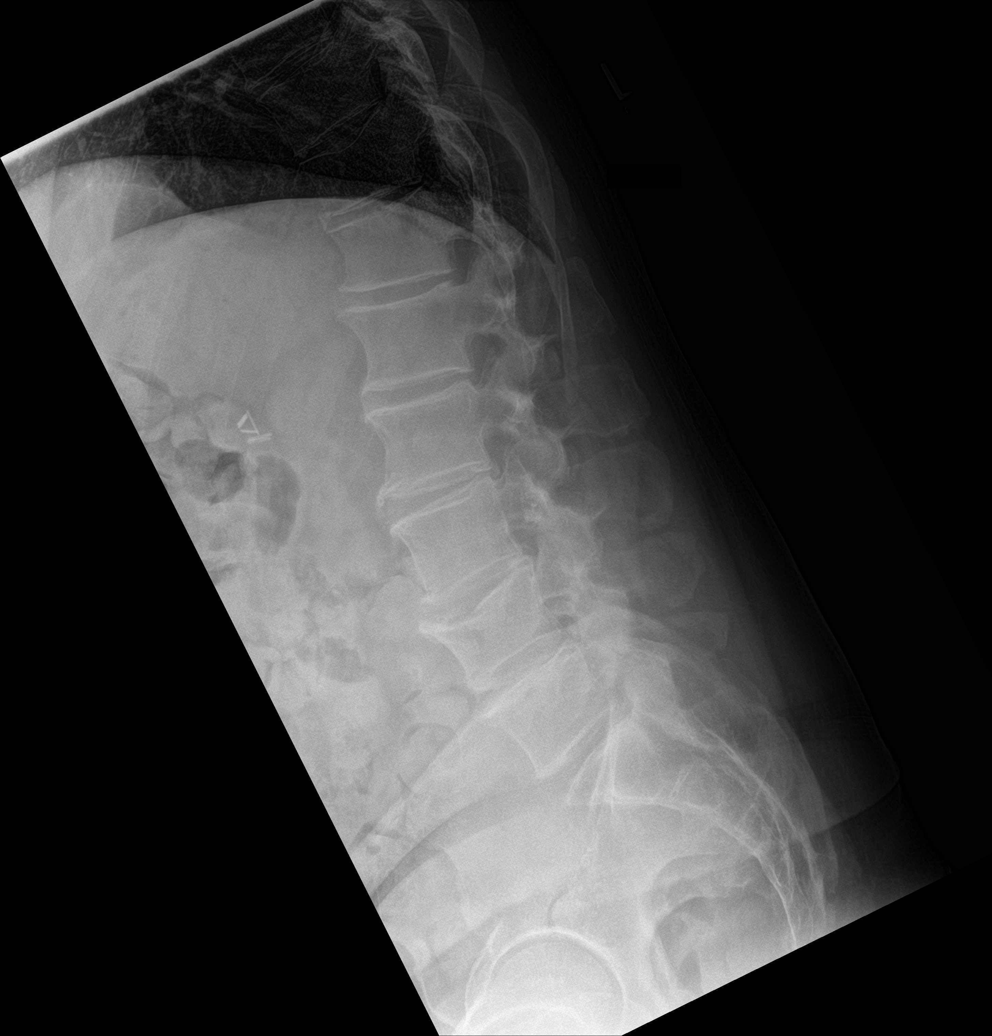

[l-spine ext]
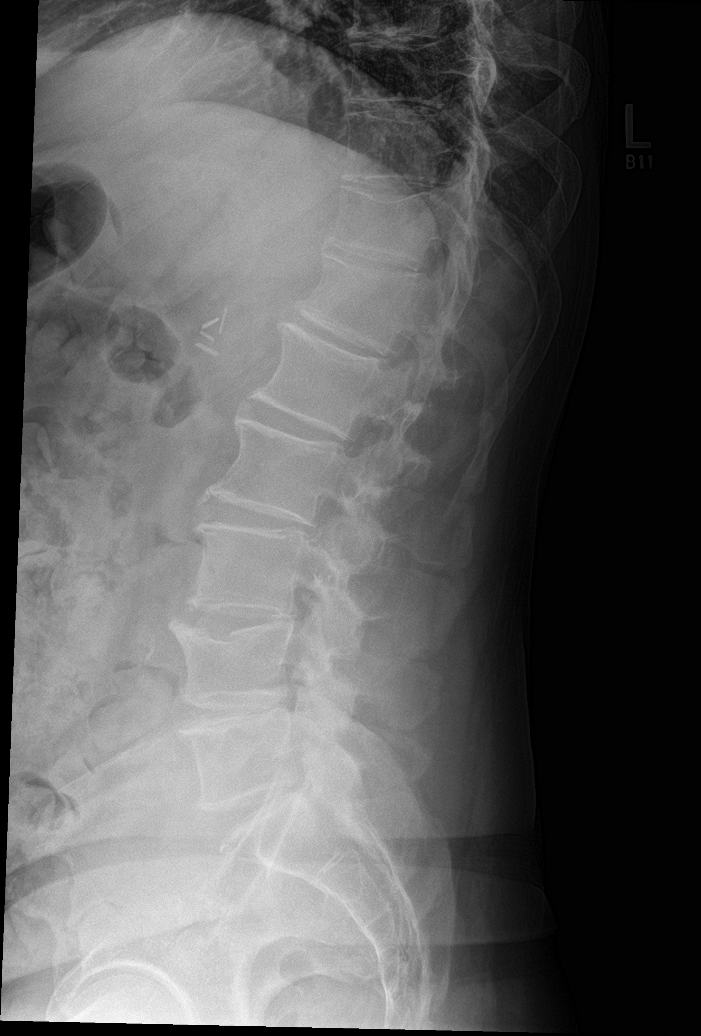

[7 of 7 positions shown; findings below may reference images not displayed]

FINDINGS: Examination demonstrates mild to moderate spondylosis of the lumbar
spine to include moderate facet arthropathy over the mid to lower
lumbar spine. Vertebral body alignment is within normal. There is
mild interval progression of superior endplate compression of L4.
There is mild anterior wedging of T11 and T12 slightly worse. There
is disc space narrowing at the L3-4 and L4-5 levels. No evidence of
instability upon flexion and extension.
IMPRESSION: Mild-to-moderate spondylosis of the lumbar spine with mild disc
disease at the L3-4 and L4-5 levels.

Interval progression of superior endplate compression of L4. Mild
anterior wedging of T11 and T12 slightly worse.

## 2018-01-12 DIAGNOSIS — Z79899 Other long term (current) drug therapy: Secondary | ICD-10-CM | POA: Diagnosis not present

## 2018-01-12 DIAGNOSIS — G894 Chronic pain syndrome: Secondary | ICD-10-CM | POA: Diagnosis not present

## 2018-01-12 DIAGNOSIS — M542 Cervicalgia: Secondary | ICD-10-CM | POA: Diagnosis not present

## 2018-01-19 DIAGNOSIS — G894 Chronic pain syndrome: Secondary | ICD-10-CM | POA: Diagnosis not present

## 2018-01-19 DIAGNOSIS — M542 Cervicalgia: Secondary | ICD-10-CM | POA: Diagnosis not present

## 2018-01-19 DIAGNOSIS — Z79899 Other long term (current) drug therapy: Secondary | ICD-10-CM | POA: Diagnosis not present

## 2018-01-19 DIAGNOSIS — M5440 Lumbago with sciatica, unspecified side: Secondary | ICD-10-CM | POA: Diagnosis not present

## 2018-02-16 ENCOUNTER — Encounter: Payer: Self-pay | Admitting: Internal Medicine

## 2018-02-16 ENCOUNTER — Ambulatory Visit (INDEPENDENT_AMBULATORY_CARE_PROVIDER_SITE_OTHER): Payer: Medicare Other | Admitting: Internal Medicine

## 2018-02-16 VITALS — BP 108/64 | HR 87 | Temp 97.9°F | Resp 16 | Ht 66.0 in | Wt 196.0 lb

## 2018-02-16 DIAGNOSIS — L738 Other specified follicular disorders: Secondary | ICD-10-CM

## 2018-02-16 MED ORDER — DOXYCYCLINE HYCLATE 100 MG PO TABS: 100.0000 mg | ORAL_TABLET | Freq: Two times a day (BID) | ORAL | 0 refills | Status: DC

## 2018-02-16 NOTE — Progress Notes (Signed)
Subjective:    Patient ID: Samuel Burke, male    DOB: 04-18-1961, 57 y.o.   MRN: [id number]  HPI  Pt presents to the clinic today with c/o a rash to the right side of his face/neck  He noticed this 1 week ago. The rash burns. He noticed it after shaving. It has not seem to spread. He has been putting triple antibiotic ointment on it with minimal relief.  Review of Systems      Past Medical History:  Diagnosis Date  . Frequent headaches   . History of colon polyps   . Hypertension   . Spinal stenosis     Current Outpatient Medications  Medication Sig Dispense Refill  . baclofen (LIORESAL) 10 MG tablet TAKE 1 TABLET(10 MG) BY MOUTH TWICE DAILY 180 tablet 3  . Cholecalciferol (RA VITAMIN D-3) 2000 UNITS CAPS Take 1 capsule by mouth daily.     . Cholecalciferol (VITAMIN D3) 5000 units CAPS Take 1 capsule by mouth daily.    . clonazePAM (KLONOPIN) 0.5 MG tablet Take 0.5 mg by mouth daily.    . diclofenac (VOLTAREN) 50 MG EC tablet Take 1 tablet (50 mg total) by mouth 3 (three) times daily. 270 tablet 3  . docusate sodium (COLACE) 100 MG capsule Take 100 mg by mouth 2 (two) times daily.    Marland Kitchen gabapentin (NEURONTIN) 600 MG tablet TAKE 1 AND 1/2 TABLETS(900 MG) BY MOUTH THREE TIMES DAILY 405 tablet 3  . lisinopril (PRINIVIL,ZESTRIL) 5 MG tablet TAKE 1 TABLET(5 MG) BY MOUTH DAILY 90 tablet 3  . loratadine (CLARITIN) 10 MG tablet Take 10 mg by mouth daily.    Marland Kitchen morphine (MS CONTIN) 60 MG 12 hr tablet Take 1 tablet (60 mg total) by mouth 3 (three) times daily. 90 tablet 0  . nystatin ointment (MYCOSTATIN) Apply 1 application topically 2 (two) times daily. 30 g 0   No current facility-administered medications for this visit.     Allergies  Allergen Reactions  . Bee Venom Swelling  . Poison Ivy Extract [Poison Ivy Extract] Swelling    Family History  Problem Relation Age of Onset  . Cancer Mother        breast  . Alcohol abuse Father   . Heart disease Father   . Heart  disease Brother   . Hypertension Brother     Social History   Socioeconomic History  . Marital status: Widowed    Spouse name: Not on file  . Number of children: Not on file  . Years of education: Not on file  . Highest education level: Not on file  Occupational History  . Not on file  Social Needs  . Financial resource strain: Not on file  . Food insecurity:    Worry: Not on file    Inability: Not on file  . Transportation needs:    Medical: Not on file    Non-medical: Not on file  Tobacco Use  . Smoking status: Former Smoker    Last attempt to quit: 08/15/2011    Years since quitting: 6.5  . Smokeless tobacco: Never Used  Substance and Sexual Activity  . Alcohol use: No    Alcohol/week: 0.0 oz    Comment: sober--since 2002  . Drug use: No  . Sexual activity: Not Currently  Lifestyle  . Physical activity:    Days per week: Not on file    Minutes per session: Not on file  . Stress: Not on file  Relationships  .  Social connections:    Talks on phone: Not on file    Gets together: Not on file    Attends religious service: Not on file    Active member of club or organization: Not on file    Attends meetings of clubs or organizations: Not on file    Relationship status: Not on file  . Intimate partner violence:    Fear of current or ex partner: Not on file    Emotionally abused: Not on file    Physically abused: Not on file    Forced sexual activity: Not on file  Other Topics Concern  . Not on file  Social History Narrative  . Not on file     Constitutional: Denies fever, malaise, fatigue, headache or abrupt weight changes.  Skin: Pt reports rash of face. Denies ulcercations.   No other specific complaints in a complete review of systems (except as listed in HPI above).  Objective:   Physical Exam   BP 108/64 (BP Location: Right Arm, Patient Position: Sitting, Cuff Size: Small)   Pulse 87   Temp 97.9 F (36.6 C) (Oral)   Resp 16   Ht 5\' 6"  (1.676 m)    Wt 196 lb (88.9 kg)   SpO2 98%   BMI 31.64 kg/m  Wt Readings from Last 3 Encounters:  02/16/18 196 lb (88.9 kg)  12/01/17 190 lb (86.2 kg)  06/11/17 181 lb (82.1 kg)    General: Appears his stated age, chronically ill appearing, in NAD. Skin: Folliculitis noted under beard, right side of chin.  BMET    Component Value Date/Time   NA 143 06/11/2017 1257   K 4.7 06/11/2017 1257   CL 102 06/11/2017 1257   CO2 31 05/01/2017 1458   GLUCOSE 94 06/11/2017 1257   GLUCOSE 103 (H) 05/01/2017 1458   BUN 22 06/11/2017 1257   CREATININE 1.50 (H) 07/28/2017 1627   CALCIUM 9.2 06/11/2017 1257   GFRNONAA 49 (L) 06/11/2017 1257   GFRAA 57 (L) 06/11/2017 1257    Lipid Panel     Component Value Date/Time   CHOL 157 05/01/2017 1458   TRIG 139.0 05/01/2017 1458   HDL 35.30 (L) 05/01/2017 1458   CHOLHDL 4 05/01/2017 1458   VLDL 27.8 05/01/2017 1458   LDLCALC 94 05/01/2017 1458    CBC    Component Value Date/Time   WBC 6.7 05/01/2017 1458   RBC 4.57 05/01/2017 1458   HGB 14.0 05/01/2017 1458   HCT 42.5 05/01/2017 1458   PLT 221.0 05/01/2017 1458   MCV 93.0 05/01/2017 1458   MCH 30.5 01/27/2015 1118   MCHC 33.0 05/01/2017 1458   RDW 13.5 05/01/2017 1458   LYMPHSABS 1.4 01/27/2015 1118   MONOABS 1.0 01/27/2015 1118   EOSABS 0.0 01/27/2015 1118   BASOSABS 0.1 01/27/2015 1118    Hgb A1C No results found for: HGBA1C         Assessment & Plan:   Folliculitis Barbae:  Appears infected Will treat with Doxycycline 100 mg BID x 10 days- discussed sun sensitivity Continue Triple Antibiotic Ointment Avoid shaving, discussed trimming Advised him to throw away replace blade he shaved with as this is likely contaminated  Return precautions discussed Webb Silversmith, NP

## 2018-02-16 NOTE — Patient Instructions (Signed)

## 2018-03-02 ENCOUNTER — Other Ambulatory Visit: Payer: Self-pay | Admitting: Internal Medicine

## 2018-03-02 DIAGNOSIS — M48061 Spinal stenosis, lumbar region without neurogenic claudication: Secondary | ICD-10-CM

## 2018-03-09 DIAGNOSIS — M542 Cervicalgia: Secondary | ICD-10-CM | POA: Diagnosis not present

## 2018-03-09 DIAGNOSIS — Z79899 Other long term (current) drug therapy: Secondary | ICD-10-CM | POA: Diagnosis not present

## 2018-03-09 DIAGNOSIS — G894 Chronic pain syndrome: Secondary | ICD-10-CM | POA: Diagnosis not present

## 2018-03-09 DIAGNOSIS — M5440 Lumbago with sciatica, unspecified side: Secondary | ICD-10-CM | POA: Diagnosis not present

## 2018-03-17 DIAGNOSIS — Z79899 Other long term (current) drug therapy: Secondary | ICD-10-CM | POA: Diagnosis not present

## 2018-03-17 DIAGNOSIS — G894 Chronic pain syndrome: Secondary | ICD-10-CM | POA: Diagnosis not present

## 2018-03-17 DIAGNOSIS — M542 Cervicalgia: Secondary | ICD-10-CM | POA: Diagnosis not present

## 2018-03-17 DIAGNOSIS — M5440 Lumbago with sciatica, unspecified side: Secondary | ICD-10-CM | POA: Diagnosis not present

## 2018-03-31 ENCOUNTER — Other Ambulatory Visit: Payer: Self-pay | Admitting: Internal Medicine

## 2018-04-09 ENCOUNTER — Ambulatory Visit: Payer: Medicare Other | Admitting: Internal Medicine

## 2018-04-09 ENCOUNTER — Encounter: Payer: Self-pay | Admitting: Internal Medicine

## 2018-04-09 VITALS — BP 112/66 | HR 85 | Temp 98.2°F | Wt 199.0 lb

## 2018-04-09 DIAGNOSIS — L738 Other specified follicular disorders: Secondary | ICD-10-CM

## 2018-04-09 MED ORDER — SULFAMETHOXAZOLE-TRIMETHOPRIM 800-160 MG PO TABS: 1.0000 | ORAL_TABLET | Freq: Two times a day (BID) | ORAL | 1 refills | Status: DC

## 2018-04-09 NOTE — Progress Notes (Signed)
Subjective:    Patient ID: Samuel Burke, male    DOB: 1961-03-03, 57 y.o.   MRN: [id number]  HPI  Pt presents to the clinic today with c/o rash and swelling under beard. This is a recurrent issue for him and often happens after he shaves and trims. He was last treated for folliculitis 3/23 with Doxycycline. He reports symptoms completely resolved after abx course.  Review of Systems  Past Medical History:  Diagnosis Date  . Frequent headaches   . History of colon polyps   . Hypertension   . Spinal stenosis     Current Outpatient Medications  Medication Sig Dispense Refill  . baclofen (LIORESAL) 10 MG tablet TAKE 1 TABLET(10 MG) BY MOUTH TWICE DAILY 180 tablet 3  . Cholecalciferol (RA VITAMIN D-3) 2000 UNITS CAPS Take 1 capsule by mouth daily.     . Cholecalciferol (VITAMIN D3) 5000 units CAPS Take 1 capsule by mouth daily.    . clonazePAM (KLONOPIN) 0.5 MG tablet Take 0.5 mg by mouth daily.    . diclofenac (VOLTAREN) 50 MG EC tablet Take 1 tablet (50 mg total) by mouth 3 (three) times daily. 270 tablet 3  . docusate sodium (COLACE) 100 MG capsule Take 100 mg by mouth 2 (two) times daily.    Marland Kitchen gabapentin (NEURONTIN) 600 MG tablet TAKE 1 AND 1/2 TABLETS(900 MG) BY MOUTH THREE TIMES DAILY 405 tablet 3  . lisinopril (PRINIVIL,ZESTRIL) 5 MG tablet TAKE 1 TABLET(5 MG) BY MOUTH DAILY 90 tablet 3  . loratadine (CLARITIN) 10 MG tablet Take 10 mg by mouth daily.    Marland Kitchen morphine (MS CONTIN) 60 MG 12 hr tablet Take 1 tablet (60 mg total) by mouth 3 (three) times daily. 90 tablet 0  . nystatin ointment (MYCOSTATIN) Apply 1 application topically 2 (two) times daily. 30 g 0  . sulfamethoxazole-trimethoprim (BACTRIM DS,SEPTRA DS) 800-160 MG tablet Take 1 tablet by mouth 2 (two) times daily. 28 tablet 1   No current facility-administered medications for this visit.     Allergies  Allergen Reactions  . Bee Venom Swelling  . Poison Ivy Extract [Poison Ivy Extract] Swelling    Family  History  Problem Relation Age of Onset  . Cancer Mother        breast  . Alcohol abuse Father   . Heart disease Father   . Heart disease Brother   . Hypertension Brother     Social History   Socioeconomic History  . Marital status: Widowed    Spouse name: Not on file  . Number of children: Not on file  . Years of education: Not on file  . Highest education level: Not on file  Occupational History  . Not on file  Social Needs  . Financial resource strain: Not on file  . Food insecurity:    Worry: Not on file    Inability: Not on file  . Transportation needs:    Medical: Not on file    Non-medical: Not on file  Tobacco Use  . Smoking status: Former Smoker    Last attempt to quit: 08/15/2011    Years since quitting: 6.6  . Smokeless tobacco: Never Used  Substance and Sexual Activity  . Alcohol use: No    Alcohol/week: 0.0 standard drinks    Comment: sober--since 2002  . Drug use: No  . Sexual activity: Not Currently  Lifestyle  . Physical activity:    Days per week: Not on file    Minutes per  session: Not on file  . Stress: Not on file  Relationships  . Social connections:    Talks on phone: Not on file    Gets together: Not on file    Attends religious service: Not on file    Active member of club or organization: Not on file    Attends meetings of clubs or organizations: Not on file    Relationship status: Not on file  . Intimate partner violence:    Fear of current or ex partner: Not on file    Emotionally abused: Not on file    Physically abused: Not on file    Forced sexual activity: Not on file  Other Topics Concern  . Not on file  Social History Narrative  . Not on file     Constitutional: Denies fever, malaise, fatigue, headache or abrupt weight changes.  Skin: Pt reports red, raised bumps of chin. Denie ulcercations.    No other specific complaints in a complete review of systems (except as listed in HPI above).     Objective:   Physical  Exam  BP 112/66   Pulse 85   Temp 98.2 F (36.8 C) (Oral)   Wt 199 lb (90.3 kg)   SpO2 97%   BMI 32.12 kg/m  Wt Readings from Last 3 Encounters:  04/09/18 199 lb (90.3 kg)  02/16/18 196 lb (88.9 kg)  12/01/17 190 lb (86.2 kg)    General: Appears his stated age, well developed, well nourished in NAD. Skin: Folliculitis noted under beard.  BMET    Component Value Date/Time   NA 143 06/11/2017 1257   K 4.7 06/11/2017 1257   CL 102 06/11/2017 1257   CO2 31 05/01/2017 1458   GLUCOSE 94 06/11/2017 1257   GLUCOSE 103 (H) 05/01/2017 1458   BUN 22 06/11/2017 1257   CREATININE 1.50 (H) 07/28/2017 1627   CALCIUM 9.2 06/11/2017 1257   GFRNONAA 49 (L) 06/11/2017 1257   GFRAA 57 (L) 06/11/2017 1257    Lipid Panel     Component Value Date/Time   CHOL 157 05/01/2017 1458   TRIG 139.0 05/01/2017 1458   HDL 35.30 (L) 05/01/2017 1458   CHOLHDL 4 05/01/2017 1458   VLDL 27.8 05/01/2017 1458   LDLCALC 94 05/01/2017 1458    CBC    Component Value Date/Time   WBC 6.7 05/01/2017 1458   RBC 4.57 05/01/2017 1458   HGB 14.0 05/01/2017 1458   HCT 42.5 05/01/2017 1458   PLT 221.0 05/01/2017 1458   MCV 93.0 05/01/2017 1458   MCH 30.5 01/27/2015 1118   MCHC 33.0 05/01/2017 1458   RDW 13.5 05/01/2017 1458   LYMPHSABS 1.4 01/27/2015 1118   MONOABS 1.0 01/27/2015 1118   EOSABS 0.0 01/27/2015 1118   BASOSABS 0.1 01/27/2015 1118    Hgb A1C No results found for: HGBA1C          Assessment & Plan:   Recurrent Folliculitis Barbae:  eRx for Septra DS 1 tab BID x  14 days Discussed avoiding shaving. If you do, use new razor every time Discussed trimming beard short instead of shaving  Return precautions discussed Webb Silversmith, NP

## 2018-04-09 NOTE — Patient Instructions (Signed)

## 2018-05-04 ENCOUNTER — Encounter: Payer: Medicare Other | Admitting: Internal Medicine

## 2018-05-04 DIAGNOSIS — M5440 Lumbago with sciatica, unspecified side: Secondary | ICD-10-CM | POA: Diagnosis not present

## 2018-05-04 DIAGNOSIS — Z79899 Other long term (current) drug therapy: Secondary | ICD-10-CM | POA: Diagnosis not present

## 2018-05-04 DIAGNOSIS — G894 Chronic pain syndrome: Secondary | ICD-10-CM | POA: Diagnosis not present

## 2018-05-04 DIAGNOSIS — M542 Cervicalgia: Secondary | ICD-10-CM | POA: Diagnosis not present

## 2018-05-08 ENCOUNTER — Other Ambulatory Visit: Payer: Self-pay | Admitting: Internal Medicine

## 2018-05-11 DIAGNOSIS — G894 Chronic pain syndrome: Secondary | ICD-10-CM | POA: Diagnosis not present

## 2018-05-11 DIAGNOSIS — M542 Cervicalgia: Secondary | ICD-10-CM | POA: Diagnosis not present

## 2018-05-11 DIAGNOSIS — M5440 Lumbago with sciatica, unspecified side: Secondary | ICD-10-CM | POA: Diagnosis not present

## 2018-05-11 DIAGNOSIS — Z79899 Other long term (current) drug therapy: Secondary | ICD-10-CM | POA: Diagnosis not present

## 2018-05-26 ENCOUNTER — Other Ambulatory Visit: Payer: Self-pay | Admitting: Family Medicine

## 2018-05-26 NOTE — Telephone Encounter (Signed)
Samuel Burke pt

## 2018-06-02 ENCOUNTER — Encounter: Payer: Self-pay | Admitting: Internal Medicine

## 2018-06-02 ENCOUNTER — Ambulatory Visit (INDEPENDENT_AMBULATORY_CARE_PROVIDER_SITE_OTHER): Payer: Medicare Other | Admitting: Internal Medicine

## 2018-06-02 VITALS — BP 116/74 | HR 76 | Temp 97.7°F | Ht 66.0 in | Wt 193.0 lb

## 2018-06-02 DIAGNOSIS — M5442 Lumbago with sciatica, left side: Secondary | ICD-10-CM

## 2018-06-02 DIAGNOSIS — N401 Enlarged prostate with lower urinary tract symptoms: Secondary | ICD-10-CM

## 2018-06-02 DIAGNOSIS — Z0000 Encounter for general adult medical examination without abnormal findings: Secondary | ICD-10-CM | POA: Diagnosis not present

## 2018-06-02 DIAGNOSIS — F5101 Primary insomnia: Secondary | ICD-10-CM

## 2018-06-02 DIAGNOSIS — Z125 Encounter for screening for malignant neoplasm of prostate: Secondary | ICD-10-CM | POA: Diagnosis not present

## 2018-06-02 DIAGNOSIS — I10 Essential (primary) hypertension: Secondary | ICD-10-CM

## 2018-06-02 DIAGNOSIS — M5441 Lumbago with sciatica, right side: Secondary | ICD-10-CM

## 2018-06-02 DIAGNOSIS — M48061 Spinal stenosis, lumbar region without neurogenic claudication: Secondary | ICD-10-CM | POA: Diagnosis not present

## 2018-06-02 DIAGNOSIS — R3914 Feeling of incomplete bladder emptying: Secondary | ICD-10-CM

## 2018-06-02 DIAGNOSIS — G4486 Cervicogenic headache: Secondary | ICD-10-CM

## 2018-06-02 DIAGNOSIS — R51 Headache: Secondary | ICD-10-CM

## 2018-06-02 DIAGNOSIS — G8929 Other chronic pain: Secondary | ICD-10-CM

## 2018-06-02 LAB — LIPID PANEL
CHOL/HDL RATIO: 5
Cholesterol: 184 mg/dL (ref 0–200)
HDL: 39.1 mg/dL (ref >39.00)
LDL Cholesterol: 118 mg/dL — ABNORMAL HIGH (ref 0–99)
NONHDL: 145.23
Triglycerides: 136 mg/dL (ref 0.0–149.0)
VLDL: 27.2 mg/dL (ref 0.0–40.0)

## 2018-06-02 LAB — COMPREHENSIVE METABOLIC PANEL
ALT: 51 U/L (ref 0–53)
AST: 29 U/L (ref 0–37)
Albumin: 4.6 g/dL (ref 3.5–5.2)
Alkaline Phosphatase: 57 U/L (ref 39–117)
BUN: 28 mg/dL — ABNORMAL HIGH (ref 6–23)
CO2: 32 meq/L (ref 19–32)
Calcium: 9.9 mg/dL (ref 8.4–10.5)
Chloride: 98 mEq/L (ref 96–112)
Creatinine, Ser: 2.18 mg/dL — ABNORMAL HIGH (ref 0.40–1.50)
GFR: 33.03 mL/min — AB (ref >60.00)
GLUCOSE: 98 mg/dL (ref 70–99)
POTASSIUM: 5.2 meq/L — AB (ref 3.5–5.1)
Sodium: 137 mEq/L (ref 135–145)
Total Bilirubin: 0.6 mg/dL (ref 0.2–1.2)
Total Protein: 7.1 g/dL (ref 6.0–8.3)

## 2018-06-02 LAB — CBC
HEMATOCRIT: 40.1 % (ref 39.0–52.0)
HEMOGLOBIN: 13.4 g/dL (ref 13.0–17.0)
MCHC: 33.5 g/dL (ref 30.0–36.0)
MCV: 94 fl (ref 78.0–100.0)
Platelets: 198 10*3/uL (ref 150.0–400.0)
RBC: 4.27 Mil/uL (ref 4.22–5.81)
RDW: 14.4 % (ref 11.5–15.5)
WBC: 7.1 10*3/uL (ref 4.0–10.5)

## 2018-06-02 LAB — PSA, MEDICARE: PSA: 0.45 ng/ml (ref 0.10–4.00)

## 2018-06-02 MED ORDER — LISINOPRIL 5 MG PO TABS: 5.0000 mg | ORAL_TABLET | Freq: Every day | ORAL | 3 refills | Status: DC

## 2018-06-02 MED ORDER — GABAPENTIN 600 MG PO TABS: ORAL_TABLET | ORAL | 3 refills | Status: DC

## 2018-06-02 MED ORDER — BACLOFEN 10 MG PO TABS: 10.0000 mg | ORAL_TABLET | Freq: Two times a day (BID) | ORAL | 3 refills | Status: AC

## 2018-06-02 MED ORDER — DICLOFENAC SODIUM 50 MG PO TBEC: 50.0000 mg | DELAYED_RELEASE_TABLET | Freq: Three times a day (TID) | ORAL | 3 refills | Status: DC

## 2018-06-02 MED ORDER — TAMSULOSIN HCL 0.4 MG PO CAPS: 0.4000 mg | ORAL_CAPSULE | Freq: Every day | ORAL | 3 refills | Status: DC

## 2018-06-02 NOTE — Patient Instructions (Signed)

## 2018-06-02 NOTE — Progress Notes (Signed)
HPI:  Pt presents to the clinic today for his Medicare Wellness Exam. He is also due to follow up chronic conditions.   Frequent Headahces: Secondary to chronic neck pain. He takes Baclofen, Gabapentin and MS Contin as prescribed by pain management.  HTN: His BP today is 116/74. He is not taking any antihypertensive medication at this time. ECG from 01/2015 reviewed.  Spinal Stenosis: Debilitating. He walks with a rolling walker. He takes Baclofen, Gabapentin and MS Contin as prescribed by pain management.  Insomnia: He has trouble staying asleep. He takes Valium as prescribed. There is no sleep study on file.   Past Medical History:  Diagnosis Date  . Frequent headaches   . History of colon polyps   . Hypertension   . Spinal stenosis     Current Outpatient Medications  Medication Sig Dispense Refill  . baclofen (LIORESAL) 10 MG tablet TAKE 1 TABLET(10 MG) BY MOUTH TWICE DAILY 180 tablet 0  . Cholecalciferol (RA VITAMIN D-3) 2000 UNITS CAPS Take 1 capsule by mouth daily.     . Cholecalciferol (VITAMIN D3) 5000 units CAPS Take 1 capsule by mouth daily.    . clonazePAM (KLONOPIN) 0.5 MG tablet Take 0.5 mg by mouth daily.    . diclofenac (VOLTAREN) 50 MG EC tablet Take 1 tablet (50 mg total) by mouth 3 (three) times daily. 270 tablet 3  . docusate sodium (COLACE) 100 MG capsule Take 100 mg by mouth 2 (two) times daily.    Marland Kitchen gabapentin (NEURONTIN) 600 MG tablet TAKE 1 AND 1/2 TABLETS(900 MG) BY MOUTH THREE TIMES DAILY 405 tablet 0  . lisinopril (PRINIVIL,ZESTRIL) 5 MG tablet TAKE 1 TABLET(5 MG) BY MOUTH DAILY 90 tablet 0  . loratadine (CLARITIN) 10 MG tablet Take 10 mg by mouth daily.    Marland Kitchen morphine (MS CONTIN) 60 MG 12 hr tablet Take 1 tablet (60 mg total) by mouth 3 (three) times daily. 90 tablet 0  . nystatin ointment (MYCOSTATIN) Apply 1 application topically 2 (two) times daily. 30 g 0   No current facility-administered medications for this visit.     Allergies  Allergen  Reactions  . Bee Venom Swelling  . Poison Ivy Extract [Poison Ivy Extract] Swelling    Family History  Problem Relation Age of Onset  . Cancer Mother        breast  . Alcohol abuse Father   . Heart disease Father   . Heart disease Brother   . Hypertension Brother     Social History   Socioeconomic History  . Marital status: Widowed    Spouse name: Not on file  . Number of children: Not on file  . Years of education: Not on file  . Highest education level: Not on file  Occupational History  . Not on file  Social Needs  . Financial resource strain: Not on file  . Food insecurity:    Worry: Not on file    Inability: Not on file  . Transportation needs:    Medical: Not on file    Non-medical: Not on file  Tobacco Use  . Smoking status: Former Smoker    Last attempt to quit: 08/15/2011    Years since quitting: 6.8  . Smokeless tobacco: Never Used  Substance and Sexual Activity  . Alcohol use: No    Alcohol/week: 0.0 standard drinks    Comment: sober--since 2002  . Drug use: No  . Sexual activity: Not Currently  Lifestyle  . Physical activity:  Days per week: Not on file    Minutes per session: Not on file  . Stress: Not on file  Relationships  . Social connections:    Talks on phone: Not on file    Gets together: Not on file    Attends religious service: Not on file    Active member of club or organization: Not on file    Attends meetings of clubs or organizations: Not on file    Relationship status: Not on file  . Intimate partner violence:    Fear of current or ex partner: Not on file    Emotionally abused: Not on file    Physically abused: Not on file    Forced sexual activity: Not on file  Other Topics Concern  . Not on file  Social History Narrative  . Not on file    Hospitiliaztions: None  Health Maintenance:    Flu: 03/2018  Tetanus: 01/2015  PSA: 04/2017  Colon Screening: 08/2013  Eye Doctor: annually  Dental Exam:  annually   Providers:   PCP: Webb Silversmith, NP-C  Pain Management: Dr. Dossie Arbour  Neurologist: Dr. Manuella Ghazi  Podiatry: Dr. Elvina Mattes   I have personally reviewed and have noted:  1. The patient's medical and social history 2. Their use of alcohol, tobacco or illicit drugs 3. Their current medications and supplements 4. The patient's functional ability including ADL's, fall risks, home safety risks and hearing or visual impairment. 5. Diet and physical activities 6. Evidence for depression or mood disorder  Subjective:   Review of Systems:   Constitutional: Pt reports headaches. Denies fever, malaise, fatigue, or abrupt weight changes.  HEENT: Denies eye pain, eye redness, ear pain, ringing in the ears, wax buildup, runny nose, nasal congestion, bloody nose, or sore throat. Respiratory: Denies difficulty breathing, shortness of breath, cough or sputum production.   Cardiovascular: Denies chest pain, chest tightness, palpitations or swelling in the hands or feet.  Gastrointestinal: Pt reports constipation. Denies abdominal pain, bloating, diarrhea or blood in the stool.  GU: Pt reports dribbling, incomplete bladder emptying. Denies urgency, frequency, pain with urination, burning sensation, blood in urine, odor or discharge. Musculoskeletal: Pt reports chronic back pain, difficulty with gait. Denies muscle pain or joint swelling.  Skin: Denies redness, rashes, lesions or ulcercations.  Neurological: Pt reports numbness in bilateral legs, insomnia. Denies dizziness, difficulty with memory, difficulty with speech or problems with balance and coordination.  Psych: Denies anxiety, depression, SI/HI.  No other specific complaints in a complete review of systems (except as listed in HPI above).  Objective:  PE:   BP 116/74   Pulse 76   Temp 97.7 F (36.5 C) (Oral)   Ht _0  (1.676 m)   Wt 193 lb (87.5 kg)   SpO2 97%   BMI 31.15 kg/m   Wt Readings from Last 3 Encounters:  04/09/18  199 lb (90.3 kg)  02/16/18 196 lb (88.9 kg)  12/01/17 190 lb (86.2 kg)    General: Appears his stated age, chronically ill appearing in NAD. Skin: Warm, dry and intact. No  ulcerations noted. HEENT: Head: normal shape and size; Eyes: sclera white, no icterus, conjunctiva pink, PERRLA and EOMs intact; Ears: Tm's gray and intact, normal light reflex; Throat/Mouth: Teeth present, mucosa pink and moist, no exudate, lesions or ulcerations noted.  Neck: Neck supple, trachea midline. No masses, lumps or thyromegaly present.  Cardiovascular: Normal rate and rhythm. S1,S2 noted.  No murmur, rubs or gallops noted. No JVD or BLE edema.  No carotid bruits noted. Pulmonary/Chest: Normal effort and positive vesicular breath sounds. No respiratory distress. No wheezes, rales or ronchi noted.  Abdomen: Soft and nontender. Normal bowel sounds. Ventral hernia noted. Liver, spleen and kidneys non palpable. Musculoskeletal: Gait slow and steady with use of rolling walker.  Neurological: Alert and oriented. Cranial nerves II-XII grossly intact.  Psychiatric: Mood and affect normal. Behavior is normal. Judgment and thought content normal.     BMET    Component Value Date/Time   NA 143 06/11/2017 1257   K 4.7 06/11/2017 1257   CL 102 06/11/2017 1257   CO2 31 05/01/2017 1458   GLUCOSE 94 06/11/2017 1257   GLUCOSE 103 (H) 05/01/2017 1458   BUN 22 06/11/2017 1257   CREATININE 1.50 (H) 07/28/2017 1627   CALCIUM 9.2 06/11/2017 1257   GFRNONAA 49 (L) 06/11/2017 1257   GFRAA 57 (L) 06/11/2017 1257    Lipid Panel     Component Value Date/Time   CHOL 157 05/01/2017 1458   TRIG 139.0 05/01/2017 1458   HDL 35.30 (L) 05/01/2017 1458   CHOLHDL 4 05/01/2017 1458   VLDL 27.8 05/01/2017 1458   LDLCALC 94 05/01/2017 1458    CBC    Component Value Date/Time   WBC 6.7 05/01/2017 1458   RBC 4.57 05/01/2017 1458   HGB 14.0 05/01/2017 1458   HCT 42.5 05/01/2017 1458   PLT 221.0 05/01/2017 1458   MCV 93.0  05/01/2017 1458   MCH 30.5 01/27/2015 1118   MCHC 33.0 05/01/2017 1458   RDW 13.5 05/01/2017 1458   LYMPHSABS 1.4 01/27/2015 1118   MONOABS 1.0 01/27/2015 1118   EOSABS 0.0 01/27/2015 1118   BASOSABS 0.1 01/27/2015 1118    Hgb A1C No results found for: HGBA1C    Assessment and Plan:   Medicare Annual Wellness Visit:  Diet: He does eat meat.  He consumes fruits and veggies daily he tries to avoid fried foods.  He drinks mostly water, some soda. Physical activity: Sedentary Depression/mood screen: Negative Hearing: Intact to whispered voice Visual acuity: Grossly normal, performs annual eye exam  ADLs: Capable with use of rolling walker Fall risk: High due to chronic back pain and difficulty with gait Home safety: Good Cognitive evaluation: Intact to orientation, naming, recall and repetition EOL planning: No adv directives, full code/ I agree  Preventative Medicine: Flu shot UTD.  Tetanus UTD.  PSA will be done with labs today.  Colon screening up-to-date.  Encouraged him to consume a balanced diet and exercise regimen.  Advised him to see an eye doctor and dentist annually.  Will check CBC, C met, lipid, PSA today.   Next appointment: 1 year, Medicare Wellness Exam   Webb Silversmith, NP

## 2018-06-03 ENCOUNTER — Other Ambulatory Visit: Payer: Self-pay

## 2018-06-03 ENCOUNTER — Telehealth: Payer: Self-pay | Admitting: Internal Medicine

## 2018-06-03 NOTE — Telephone Encounter (Signed)
FYI : pt want to add Diazepam 5 mg 1/2 tablet each night to his medication list.

## 2018-06-03 NOTE — Telephone Encounter (Signed)
added

## 2018-06-04 ENCOUNTER — Other Ambulatory Visit: Payer: Self-pay | Admitting: Internal Medicine

## 2018-06-04 DIAGNOSIS — N183 Chronic kidney disease, stage 3 unspecified: Secondary | ICD-10-CM

## 2018-06-09 NOTE — Assessment & Plan Note (Signed)
Controlled off meds Reinforced DASH diet Will monitor

## 2018-06-09 NOTE — Assessment & Plan Note (Signed)
Stable on Valium We will monitor

## 2018-06-09 NOTE — Assessment & Plan Note (Signed)
Disabling Continue Baclofen, Gabapentin and MS Contin He will continue to follow with pain management

## 2018-06-09 NOTE — Assessment & Plan Note (Signed)
Continue Baclofen, Gabapentin and MS Contin We will continue to follow with pain management

## 2018-06-12 DIAGNOSIS — N400 Enlarged prostate without lower urinary tract symptoms: Secondary | ICD-10-CM | POA: Insufficient documentation

## 2018-06-12 NOTE — Assessment & Plan Note (Signed)
Will trial Flomax

## 2018-06-29 DIAGNOSIS — M5440 Lumbago with sciatica, unspecified side: Secondary | ICD-10-CM | POA: Diagnosis not present

## 2018-06-29 DIAGNOSIS — M542 Cervicalgia: Secondary | ICD-10-CM | POA: Diagnosis not present

## 2018-06-29 DIAGNOSIS — Z79899 Other long term (current) drug therapy: Secondary | ICD-10-CM | POA: Diagnosis not present

## 2018-06-29 DIAGNOSIS — G894 Chronic pain syndrome: Secondary | ICD-10-CM | POA: Diagnosis not present

## 2018-07-30 ENCOUNTER — Encounter: Payer: Self-pay | Admitting: Internal Medicine

## 2018-07-30 ENCOUNTER — Ambulatory Visit (INDEPENDENT_AMBULATORY_CARE_PROVIDER_SITE_OTHER): Payer: Medicare Other | Admitting: Internal Medicine

## 2018-07-30 VITALS — BP 118/70 | HR 95 | Temp 97.7°F | Wt 187.0 lb

## 2018-07-30 DIAGNOSIS — L738 Other specified follicular disorders: Secondary | ICD-10-CM | POA: Diagnosis not present

## 2018-07-30 MED ORDER — CEPHALEXIN 500 MG PO CAPS: 500.0000 mg | ORAL_CAPSULE | Freq: Four times a day (QID) | ORAL | 0 refills | Status: DC

## 2018-07-30 NOTE — Progress Notes (Signed)
Subjective:    Patient ID: Samuel Burke, male    DOB: 17-Jun-1961, 58 y.o.   MRN: [id number]  HPI  Pt presents to the clinic today with c/o swelling and drainage from a bump under his chin. He reports after he shaves, he gets these bumps. They are painful. He gets recurrent Folliculitis Barbae. He reports it varies from side to side. He has been on multiple abx for the same in the past. He changes his razors. He has tried trimming instead of shaving.  Review of Systems      Past Medical History:  Diagnosis Date  . Frequent headaches   . History of colon polyps   . Hypertension   . Spinal stenosis     Current Outpatient Medications  Medication Sig Dispense Refill  . baclofen (LIORESAL) 10 MG tablet Take 1 tablet (10 mg total) by mouth 2 (two) times daily. 180 tablet 3  . Cholecalciferol (RA VITAMIN D-3) 2000 UNITS CAPS Take 1 capsule by mouth daily.     . Cholecalciferol (VITAMIN D3) 5000 units CAPS Take 1 capsule by mouth daily.    . diazepam (VALIUM) 5 MG tablet Take 0.5 tablets by mouth at bedtime.    . docusate sodium (COLACE) 100 MG capsule Take 100 mg by mouth 2 (two) times daily.    Marland Kitchen gabapentin (NEURONTIN) 600 MG tablet TAKE 1 AND 1/2 TABLETS(900 MG) BY MOUTH THREE TIMES DAILY 405 tablet 3  . loratadine (CLARITIN) 10 MG tablet Take 10 mg by mouth daily.    Marland Kitchen morphine (MS CONTIN) 60 MG 12 hr tablet Take 1 tablet (60 mg total) by mouth 3 (three) times daily. 90 tablet 0  . nystatin ointment (MYCOSTATIN) Apply 1 application topically 2 (two) times daily. 30 g 0  . tamsulosin (FLOMAX) 0.4 MG CAPS capsule Take 1 capsule (0.4 mg total) by mouth daily. 90 capsule 3   No current facility-administered medications for this visit.     Allergies  Allergen Reactions  . Bee Venom Swelling  . Poison Ivy Extract [Poison Ivy Extract] Swelling    Family History  Problem Relation Age of Onset  . Cancer Mother        breast  . Alcohol abuse Father   . Heart disease Father   .  Heart disease Brother   . Hypertension Brother     Social History   Socioeconomic History  . Marital status: Widowed    Spouse name: Not on file  . Number of children: Not on file  . Years of education: Not on file  . Highest education level: Not on file  Occupational History  . Not on file  Social Needs  . Financial resource strain: Not on file  . Food insecurity:    Worry: Not on file    Inability: Not on file  . Transportation needs:    Medical: Not on file    Non-medical: Not on file  Tobacco Use  . Smoking status: Former Smoker    Last attempt to quit: 08/15/2011    Years since quitting: 6.9  . Smokeless tobacco: Never Used  Substance and Sexual Activity  . Alcohol use: No    Alcohol/week: 0.0 standard drinks    Comment: sober--since 2002  . Drug use: No  . Sexual activity: Not Currently  Lifestyle  . Physical activity:    Days per week: Not on file    Minutes per session: Not on file  . Stress: Not on file  Relationships  .  Social connections:    Talks on phone: Not on file    Gets together: Not on file    Attends religious service: Not on file    Active member of club or organization: Not on file    Attends meetings of clubs or organizations: Not on file    Relationship status: Not on file  . Intimate partner violence:    Fear of current or ex partner: Not on file    Emotionally abused: Not on file    Physically abused: Not on file    Forced sexual activity: Not on file  Other Topics Concern  . Not on file  Social History Narrative  . Not on file     Constitutional: Denies fever, malaise, fatigue, headache or abrupt weight changes.  Skin: Pt reports bump with swelling, redness, drainage.    No other specific complaints in a complete review of systems (except as listed in HPI above).  Objective:   Physical Exam   BP 118/70   Pulse 95   Temp 97.7 F (36.5 C) (Oral)   Wt 187 lb (84.8 kg)   SpO2 97%   BMI 30.18 kg/m  Wt Readings from Last  3 Encounters:  07/30/18 187 lb (84.8 kg)  06/02/18 193 lb (87.5 kg)  04/09/18 199 lb (90.3 kg)    General: Appears their stated age, well developed, well nourished in NAD. Skin: Warm, dry and intact. Folliculitis noted of right side of chin.   BMET    Component Value Date/Time   NA 137 06/02/2018 1533   NA 143 06/11/2017 1257   K 5.2 (H) 06/02/2018 1533   CL 98 06/02/2018 1533   CO2 32 06/02/2018 1533   GLUCOSE 98 06/02/2018 1533   BUN 28 (H) 06/02/2018 1533   BUN 22 06/11/2017 1257   CREATININE 2.18 (H) 06/02/2018 1533   CALCIUM 9.9 06/02/2018 1533   GFRNONAA 49 (L) 06/11/2017 1257   GFRAA 57 (L) 06/11/2017 1257    Lipid Panel     Component Value Date/Time   CHOL 184 06/02/2018 1533   TRIG 136.0 06/02/2018 1533   HDL 39.10 06/02/2018 1533   CHOLHDL 5 06/02/2018 1533   VLDL 27.2 06/02/2018 1533   LDLCALC 118 (H) 06/02/2018 1533    CBC    Component Value Date/Time   WBC 7.1 06/02/2018 1533   RBC 4.27 06/02/2018 1533   HGB 13.4 06/02/2018 1533   HCT 40.1 06/02/2018 1533   PLT 198.0 06/02/2018 1533   MCV 94.0 06/02/2018 1533   MCH 30.5 01/27/2015 1118   MCHC 33.5 06/02/2018 1533   RDW 14.4 06/02/2018 1533   LYMPHSABS 1.4 01/27/2015 1118   MONOABS 1.0 01/27/2015 1118   EOSABS 0.0 01/27/2015 1118   BASOSABS 0.1 01/27/2015 1118    Hgb A1C No results found for: HGBA1C         Assessment & Plan:   Folliculitis Barbae:  RX for Keflex 500 mg QID x 7 days Referral to derm placed for further evaluation  Return precautions discussed Webb Silversmith, NP

## 2018-07-30 NOTE — Patient Instructions (Signed)

## 2018-08-04 DIAGNOSIS — G894 Chronic pain syndrome: Secondary | ICD-10-CM | POA: Diagnosis not present

## 2018-08-04 DIAGNOSIS — M542 Cervicalgia: Secondary | ICD-10-CM | POA: Diagnosis not present

## 2018-08-04 DIAGNOSIS — Z79899 Other long term (current) drug therapy: Secondary | ICD-10-CM | POA: Diagnosis not present

## 2018-08-17 DIAGNOSIS — N183 Chronic kidney disease, stage 3 (moderate): Secondary | ICD-10-CM | POA: Diagnosis not present

## 2018-08-17 DIAGNOSIS — I129 Hypertensive chronic kidney disease with stage 1 through stage 4 chronic kidney disease, or unspecified chronic kidney disease: Secondary | ICD-10-CM | POA: Diagnosis not present

## 2018-08-20 ENCOUNTER — Other Ambulatory Visit: Payer: Self-pay | Admitting: Nephrology

## 2018-08-20 DIAGNOSIS — N183 Chronic kidney disease, stage 3 unspecified: Secondary | ICD-10-CM

## 2018-08-25 ENCOUNTER — Ambulatory Visit
Admission: RE | Admit: 2018-08-25 | Discharge: 2018-08-25 | Disposition: A | Discharge status: Home / Self Care | Payer: Medicare Other | Source: Ambulatory Visit | Attending: Nephrology | Admitting: Nephrology

## 2018-08-25 DIAGNOSIS — N183 Chronic kidney disease, stage 3 unspecified: Secondary | ICD-10-CM

## 2018-08-26 ENCOUNTER — Ambulatory Visit: Payer: Medicare Other

## 2018-08-31 DIAGNOSIS — Z79899 Other long term (current) drug therapy: Secondary | ICD-10-CM | POA: Diagnosis not present

## 2018-08-31 DIAGNOSIS — G894 Chronic pain syndrome: Secondary | ICD-10-CM | POA: Diagnosis not present

## 2018-08-31 DIAGNOSIS — M542 Cervicalgia: Secondary | ICD-10-CM | POA: Diagnosis not present

## 2018-08-31 DIAGNOSIS — M5440 Lumbago with sciatica, unspecified side: Secondary | ICD-10-CM | POA: Diagnosis not present

## 2018-09-14 DIAGNOSIS — L400 Psoriasis vulgaris: Secondary | ICD-10-CM | POA: Diagnosis not present

## 2018-09-14 DIAGNOSIS — L738 Other specified follicular disorders: Secondary | ICD-10-CM | POA: Diagnosis not present

## 2018-09-14 DIAGNOSIS — L408 Other psoriasis: Secondary | ICD-10-CM | POA: Diagnosis not present

## 2018-09-15 ENCOUNTER — Telehealth: Payer: Self-pay | Admitting: Internal Medicine

## 2018-09-15 NOTE — Telephone Encounter (Signed)
Best number [phone] Pt called stating he saw [facility] yesterday  Dr Sharyne Peach.   Dr Sharyne Peach never address the meds you took him off linsipril, and diclofenac.  Pt wanted to know if it would be ok for him to restart these meds  Please advise.

## 2018-09-15 NOTE — Telephone Encounter (Signed)
The dermatologist would not have addressed this. Has he seen the kidney specialist? They would be the ones to advise him about resuming the medications.

## 2018-09-16 NOTE — Telephone Encounter (Signed)
Pt is aware as instructed, yes he has seen Nephrology recently had an [place]

## 2018-09-22 DIAGNOSIS — I129 Hypertensive chronic kidney disease with stage 1 through stage 4 chronic kidney disease, or unspecified chronic kidney disease: Secondary | ICD-10-CM | POA: Diagnosis not present

## 2018-09-22 DIAGNOSIS — N183 Chronic kidney disease, stage 3 (moderate): Secondary | ICD-10-CM | POA: Diagnosis not present

## 2018-11-02 DIAGNOSIS — G894 Chronic pain syndrome: Secondary | ICD-10-CM | POA: Diagnosis not present

## 2018-11-02 DIAGNOSIS — M542 Cervicalgia: Secondary | ICD-10-CM | POA: Diagnosis not present

## 2018-11-02 DIAGNOSIS — M5440 Lumbago with sciatica, unspecified side: Secondary | ICD-10-CM | POA: Diagnosis not present

## 2018-11-12 DIAGNOSIS — G894 Chronic pain syndrome: Secondary | ICD-10-CM | POA: Diagnosis not present

## 2018-11-12 DIAGNOSIS — M542 Cervicalgia: Secondary | ICD-10-CM | POA: Diagnosis not present

## 2018-11-12 DIAGNOSIS — M5440 Lumbago with sciatica, unspecified side: Secondary | ICD-10-CM | POA: Diagnosis not present

## 2018-11-19 DIAGNOSIS — L308 Other specified dermatitis: Secondary | ICD-10-CM | POA: Diagnosis not present

## 2018-11-19 DIAGNOSIS — R21 Rash and other nonspecific skin eruption: Secondary | ICD-10-CM | POA: Diagnosis not present

## 2018-11-19 DIAGNOSIS — L731 Pseudofolliculitis barbae: Secondary | ICD-10-CM | POA: Diagnosis not present

## 2018-12-07 DIAGNOSIS — M542 Cervicalgia: Secondary | ICD-10-CM | POA: Diagnosis not present

## 2018-12-07 DIAGNOSIS — G894 Chronic pain syndrome: Secondary | ICD-10-CM | POA: Diagnosis not present

## 2018-12-07 DIAGNOSIS — M5440 Lumbago with sciatica, unspecified side: Secondary | ICD-10-CM | POA: Diagnosis not present

## 2018-12-28 DIAGNOSIS — L281 Prurigo nodularis: Secondary | ICD-10-CM | POA: Diagnosis not present

## 2018-12-28 DIAGNOSIS — L738 Other specified follicular disorders: Secondary | ICD-10-CM | POA: Diagnosis not present

## 2018-12-28 DIAGNOSIS — D485 Neoplasm of uncertain behavior of skin: Secondary | ICD-10-CM | POA: Diagnosis not present

## 2018-12-28 DIAGNOSIS — L309 Dermatitis, unspecified: Secondary | ICD-10-CM | POA: Diagnosis not present

## 2019-01-04 DIAGNOSIS — G894 Chronic pain syndrome: Secondary | ICD-10-CM | POA: Diagnosis not present

## 2019-01-04 DIAGNOSIS — M542 Cervicalgia: Secondary | ICD-10-CM | POA: Diagnosis not present

## 2019-01-04 DIAGNOSIS — Z79899 Other long term (current) drug therapy: Secondary | ICD-10-CM | POA: Diagnosis not present

## 2019-01-04 DIAGNOSIS — M5440 Lumbago with sciatica, unspecified side: Secondary | ICD-10-CM | POA: Diagnosis not present

## 2019-01-07 DIAGNOSIS — M542 Cervicalgia: Secondary | ICD-10-CM | POA: Diagnosis not present

## 2019-01-07 DIAGNOSIS — G8929 Other chronic pain: Secondary | ICD-10-CM | POA: Diagnosis not present

## 2019-01-07 DIAGNOSIS — G894 Chronic pain syndrome: Secondary | ICD-10-CM | POA: Diagnosis not present

## 2019-01-22 ENCOUNTER — Telehealth: Payer: Self-pay | Admitting: *Deleted

## 2019-01-22 NOTE — Telephone Encounter (Signed)
Needie NP with [facility] left a voicemail stating that she was out to see the patient and his urine shows protein plus 1.

## 2019-01-22 NOTE — Telephone Encounter (Signed)
noted 

## 2019-02-08 DIAGNOSIS — M545 Low back pain: Secondary | ICD-10-CM | POA: Diagnosis not present

## 2019-02-08 DIAGNOSIS — G894 Chronic pain syndrome: Secondary | ICD-10-CM | POA: Diagnosis not present

## 2019-02-08 DIAGNOSIS — M542 Cervicalgia: Secondary | ICD-10-CM | POA: Diagnosis not present

## 2019-03-15 DIAGNOSIS — M5440 Lumbago with sciatica, unspecified side: Secondary | ICD-10-CM | POA: Diagnosis not present

## 2019-03-15 DIAGNOSIS — G894 Chronic pain syndrome: Secondary | ICD-10-CM | POA: Diagnosis not present

## 2019-03-15 DIAGNOSIS — M542 Cervicalgia: Secondary | ICD-10-CM | POA: Diagnosis not present

## 2019-03-25 DIAGNOSIS — G894 Chronic pain syndrome: Secondary | ICD-10-CM | POA: Diagnosis not present

## 2019-03-25 DIAGNOSIS — M542 Cervicalgia: Secondary | ICD-10-CM | POA: Diagnosis not present

## 2019-03-25 DIAGNOSIS — M5440 Lumbago with sciatica, unspecified side: Secondary | ICD-10-CM | POA: Diagnosis not present

## 2019-04-12 DIAGNOSIS — M5440 Lumbago with sciatica, unspecified side: Secondary | ICD-10-CM | POA: Diagnosis not present

## 2019-04-12 DIAGNOSIS — G894 Chronic pain syndrome: Secondary | ICD-10-CM | POA: Diagnosis not present

## 2019-04-12 DIAGNOSIS — Z79899 Other long term (current) drug therapy: Secondary | ICD-10-CM | POA: Diagnosis not present

## 2019-04-12 DIAGNOSIS — M542 Cervicalgia: Secondary | ICD-10-CM | POA: Diagnosis not present

## 2019-05-15 ENCOUNTER — Other Ambulatory Visit: Payer: Self-pay | Admitting: Internal Medicine

## 2019-05-17 DIAGNOSIS — M542 Cervicalgia: Secondary | ICD-10-CM | POA: Diagnosis not present

## 2019-05-17 DIAGNOSIS — G894 Chronic pain syndrome: Secondary | ICD-10-CM | POA: Diagnosis not present

## 2019-05-17 DIAGNOSIS — G8921 Chronic pain due to trauma: Secondary | ICD-10-CM | POA: Diagnosis not present

## 2019-05-17 DIAGNOSIS — M5440 Lumbago with sciatica, unspecified side: Secondary | ICD-10-CM | POA: Diagnosis not present

## 2019-06-10 DIAGNOSIS — G894 Chronic pain syndrome: Secondary | ICD-10-CM | POA: Diagnosis not present

## 2019-06-10 DIAGNOSIS — M542 Cervicalgia: Secondary | ICD-10-CM | POA: Diagnosis not present

## 2019-06-14 DIAGNOSIS — G894 Chronic pain syndrome: Secondary | ICD-10-CM | POA: Diagnosis not present

## 2019-06-14 DIAGNOSIS — G8921 Chronic pain due to trauma: Secondary | ICD-10-CM | POA: Diagnosis not present

## 2019-06-14 DIAGNOSIS — M542 Cervicalgia: Secondary | ICD-10-CM | POA: Diagnosis not present

## 2019-06-14 DIAGNOSIS — Z79899 Other long term (current) drug therapy: Secondary | ICD-10-CM | POA: Diagnosis not present

## 2019-06-14 DIAGNOSIS — M5440 Lumbago with sciatica, unspecified side: Secondary | ICD-10-CM | POA: Diagnosis not present

## 2019-07-14 DIAGNOSIS — G894 Chronic pain syndrome: Secondary | ICD-10-CM | POA: Diagnosis not present

## 2019-07-14 DIAGNOSIS — M542 Cervicalgia: Secondary | ICD-10-CM | POA: Diagnosis not present

## 2019-07-14 DIAGNOSIS — M5440 Lumbago with sciatica, unspecified side: Secondary | ICD-10-CM | POA: Diagnosis not present

## 2019-07-16 ENCOUNTER — Other Ambulatory Visit: Payer: Self-pay | Admitting: Internal Medicine

## 2019-08-09 DIAGNOSIS — G894 Chronic pain syndrome: Secondary | ICD-10-CM | POA: Diagnosis not present

## 2019-08-09 DIAGNOSIS — M542 Cervicalgia: Secondary | ICD-10-CM | POA: Diagnosis not present

## 2019-08-09 DIAGNOSIS — Z79899 Other long term (current) drug therapy: Secondary | ICD-10-CM | POA: Diagnosis not present

## 2019-08-09 DIAGNOSIS — M5440 Lumbago with sciatica, unspecified side: Secondary | ICD-10-CM | POA: Diagnosis not present

## 2019-08-09 DIAGNOSIS — G8921 Chronic pain due to trauma: Secondary | ICD-10-CM | POA: Diagnosis not present

## 2019-08-13 ENCOUNTER — Other Ambulatory Visit: Payer: Self-pay | Admitting: Internal Medicine

## 2019-08-13 DIAGNOSIS — M48061 Spinal stenosis, lumbar region without neurogenic claudication: Secondary | ICD-10-CM

## 2019-08-13 NOTE — Telephone Encounter (Signed)
He told me back in 03/2019 that he was not taking this. That is why it was denied. Is he wanting to restart?

## 2019-08-13 NOTE — Telephone Encounter (Signed)
Looks like you d/c medication at last AWV... please advise... overdue CPE letter mailed

## 2019-08-13 NOTE — Telephone Encounter (Signed)
Patient called back to check on refill denial   Advised I would send a message    Let patient know that it does look like he is over due for a physical.  So we went ahead and scheduled that

## 2019-08-17 ENCOUNTER — Other Ambulatory Visit: Payer: Self-pay | Admitting: Internal Medicine

## 2019-08-17 DIAGNOSIS — M48061 Spinal stenosis, lumbar region without neurogenic claudication: Secondary | ICD-10-CM

## 2019-08-17 MED ORDER — DICLOFENAC SODIUM 75 MG PO TBEC: 75.0000 mg | DELAYED_RELEASE_TABLET | Freq: Two times a day (BID) | ORAL | 5 refills | Status: DC

## 2019-08-17 NOTE — Addendum Note (Signed)
Addended by: Jearld Fenton on: 08/17/2019 10:34 AM   Modules accepted: Orders

## 2019-08-17 NOTE — Telephone Encounter (Signed)
Diclofenac sent to pharmacy

## 2019-08-17 NOTE — Telephone Encounter (Signed)
Patient stated that he has never stopped taking the medication He stated that back in 03/2019 he was not referring to that and there must be a mos communication somewhere. He said that he is continuing to take this and get refill    He stated that he has to pick this up tomorrow or he will be out of medication

## 2019-08-23 ENCOUNTER — Telehealth: Payer: Self-pay

## 2019-08-23 NOTE — Telephone Encounter (Signed)
[facility] Night - Client Nonclinical Telephone Record AccessNurse Client [facility] Night - Client Client Site [place] - Night Contact Type Call Who Is Calling Patient / Member / Family / Caregiver Caller Name Sultan Phone Number [phone] Patient Name Samuel Burke Patient DOB [birth year] Call Type Message Only Information Provided Reason for Call Request for General Office Information Initial Comment Caller says he was scheduled to have an appt today but says office is closed Additional Comment Disp. Time Disposition Final User 08/21/2019 2:26:07 PM General Information Provided Yes Adela Lank Call Closed By: Adela Lank Transaction Date/Time: 08/21/2019 2:22:30 PM (ET)

## 2019-09-06 DIAGNOSIS — G894 Chronic pain syndrome: Secondary | ICD-10-CM | POA: Diagnosis not present

## 2019-09-06 DIAGNOSIS — M5440 Lumbago with sciatica, unspecified side: Secondary | ICD-10-CM | POA: Diagnosis not present

## 2019-09-06 DIAGNOSIS — G8921 Chronic pain due to trauma: Secondary | ICD-10-CM | POA: Diagnosis not present

## 2019-09-06 DIAGNOSIS — M542 Cervicalgia: Secondary | ICD-10-CM | POA: Diagnosis not present

## 2019-09-06 DIAGNOSIS — Z79899 Other long term (current) drug therapy: Secondary | ICD-10-CM | POA: Diagnosis not present

## 2019-09-21 ENCOUNTER — Encounter: Payer: Medicare Other | Admitting: Internal Medicine

## 2019-09-21 NOTE — Progress Notes (Deleted)
HPI:  Past Medical History:  Diagnosis Date  . Frequent headaches   . History of colon polyps   . Hypertension   . Spinal stenosis     Current Outpatient Medications  Medication Sig Dispense Refill  . baclofen (LIORESAL) 10 MG tablet Take 1 tablet (10 mg total) by mouth 2 (two) times daily. 180 tablet 3  . cephALEXin (KEFLEX) 500 MG capsule Take 1 capsule (500 mg total) by mouth 4 (four) times daily. 28 capsule 0  . Cholecalciferol (RA VITAMIN D-3) 2000 UNITS CAPS Take 1 capsule by mouth daily.     . Cholecalciferol (VITAMIN D3) 5000 units CAPS Take 1 capsule by mouth daily.    . diazepam (VALIUM) 5 MG tablet Take 0.5 tablets by mouth at bedtime.    . diclofenac (VOLTAREN) 50 MG EC tablet TAKE 1 TABLET(50 MG) BY MOUTH THREE TIMES DAILY 270 tablet 0  . diclofenac (VOLTAREN) 75 MG EC tablet Take 1 tablet (75 mg total) by mouth 2 (two) times daily. 30 tablet 5  . docusate sodium (COLACE) 100 MG capsule Take 100 mg by mouth 2 (two) times daily.    Marland Kitchen gabapentin (NEURONTIN) 600 MG tablet TAKE 1 AND 1/2 TABLETS(900 MG) BY MOUTH THREE TIMES DAILY 405 tablet 0  . loratadine (CLARITIN) 10 MG tablet Take 10 mg by mouth daily.    Marland Kitchen morphine (MS CONTIN) 60 MG 12 hr tablet Take 1 tablet (60 mg total) by mouth 3 (three) times daily. 90 tablet 0  . nystatin ointment (MYCOSTATIN) Apply 1 application topically 2 (two) times daily. 30 g 0  . tamsulosin (FLOMAX) 0.4 MG CAPS capsule TAKE 1 CAPSULE(0.4 MG) BY MOUTH DAILY 90 capsule 0   No current facility-administered medications for this visit.    Allergies  Allergen Reactions  . Bee Venom Swelling  . Poison Ivy Extract [Poison Ivy Extract] Swelling    Family History  Problem Relation Age of Onset  . Cancer Mother        breast  . Alcohol abuse Father   . Heart disease Father   . Heart disease Brother   . Hypertension Brother     Social History   Socioeconomic History  . Marital status: Widowed    Spouse name: Not on file  . Number of  children: Not on file  . Years of education: Not on file  . Highest education level: Not on file  Occupational History  . Not on file  Tobacco Use  . Smoking status: Former Smoker    Quit date: 08/15/2011    Years since quitting: 8.1  . Smokeless tobacco: Never Used  Substance and Sexual Activity  . Alcohol use: No    Alcohol/week: 0.0 standard drinks    Comment: sober--since 2002  . Drug use: No  . Sexual activity: Not Currently  Other Topics Concern  . Not on file  Social History Narrative  . Not on file   Social Determinants of Health   Financial Resource Strain:   . Difficulty of Paying Living Expenses: Not on file  Food Insecurity:   . Worried About Charity fundraiser in the Last Year: Not on file  . Ran Out of Food in the Last Year: Not on file  Transportation Needs:   . Lack of Transportation (Medical): Not on file  . Lack of Transportation (Non-Medical): Not on file  Physical Activity:   . Days of Exercise per Week: Not on file  . Minutes of Exercise per Session: Not  on file  Stress:   . Feeling of Stress : Not on file  Social Connections:   . Frequency of Communication with Friends and Family: Not on file  . Frequency of Social Gatherings with Friends and Family: Not on file  . Attends Religious Services: Not on file  . Active Member of Clubs or Organizations: Not on file  . Attends Archivist Meetings: Not on file  . Marital Status: Not on file  Intimate Partner Violence:   . Fear of Current or Ex-Partner: Not on file  . Emotionally Abused: Not on file  . Physically Abused: Not on file  . Sexually Abused: Not on file    Hospitiliaztions:  Health Maintenance:    Flu:  Tetanus:  Pneumovax:  Prevnar:  Zostavax:  Mammogram:  Pap Smear:  PSA:  Bone Density:  Colon Screening:  Eye Doctor:  Dental Exam:   Providers:   PCP:  Dermatologist:  Cardiologist:  ENT:  Gastroenterologist:  Pulmonologist:   I have personally reviewed and  have noted:  1. The patient's medical and social history 2. Their use of alcohol, tobacco or illicit drugs 3. Their current medications and supplements 4. The patient's functional ability including ADL's, fall risks, home safety risks and  hearing or visual impairment. 5. Diet and physical activities 6. Evidence for depression or mood disorder  Subjective:   Review of Systems:   Constitutional: Denies fever, malaise, fatigue, headache or abrupt weight changes.  HEENT: Denies eye pain, eye redness, ear pain, ringing in the ears, wax buildup, runny nose, nasal congestion, bloody nose, or sore throat. Respiratory: Denies difficulty breathing, shortness of breath, cough or sputum production.   Cardiovascular: Denies chest pain, chest tightness, palpitations or swelling in the hands or feet.  Gastrointestinal: Denies abdominal pain, bloating, constipation, diarrhea or blood in the stool.  GU: Denies urgency, frequency, pain with urination, burning sensation, blood in urine, odor or discharge. Musculoskeletal: Denies decrease in range of motion, difficulty with gait, muscle pain or joint pain and swelling.  Skin: Denies redness, rashes, lesions or ulcercations.  Neurological: Denies dizziness, difficulty with memory, difficulty with speech or problems with balance and coordination.  Psych: Denies anxiety, depression, SI/HI.  No other specific complaints in a complete review of systems (except as listed in HPI above).  Objective:  PE:   There were no vitals taken for this visit. Wt Readings from Last 3 Encounters:  07/30/18 187 lb (84.8 kg)  06/02/18 193 lb (87.5 kg)  04/09/18 199 lb (90.3 kg)    General: Appears their stated age, well developed, well nourished in NAD. Skin: Warm, dry and intact. No rashes, lesions or ulcerations noted. HEENT: Head: normal shape and size; Eyes: sclera white, no icterus, conjunctiva pink, PERRLA and EOMs intact; Ears: Tm's gray and intact, normal light  reflex; Throat/Mouth: Teeth present, mucosa pink and moist, no exudate, lesions or ulcerations noted.  Neck: Neck supple, trachea midline. No masses, lumps or thyromegaly present.  Cardiovascular: Normal rate and rhythm. S1,S2 noted.  No murmur, rubs or gallops noted. No JVD or BLE edema. No carotid bruits noted. Pulmonary/Chest: Normal effort and positive vesicular breath sounds. No respiratory distress. No wheezes, rales or ronchi noted.  Abdomen: Soft and nontender. Normal bowel sounds. No distention or masses noted. Liver, spleen and kidneys non palpable. Musculoskeletal: Normal range of motion. Strength 5/5 BUE/BLE. No signs of joint swelling.  Neurological: Alert and oriented. Cranial nerves II-XII grossly intact. Coordination normal.  Psychiatric: Mood and affect normal.  Behavior is normal. Judgment and thought content normal.   EKG:  BMET    Component Value Date/Time   NA 137 06/02/2018 1533   NA 143 06/11/2017 1257   K 5.2 (H) 06/02/2018 1533   CL 98 06/02/2018 1533   CO2 32 06/02/2018 1533   GLUCOSE 98 06/02/2018 1533   BUN 28 (H) 06/02/2018 1533   BUN 22 06/11/2017 1257   CREATININE 2.18 (H) 06/02/2018 1533   CALCIUM 9.9 06/02/2018 1533   GFRNONAA 49 (L) 06/11/2017 1257   GFRAA 57 (L) 06/11/2017 1257    Lipid Panel     Component Value Date/Time   CHOL 184 06/02/2018 1533   TRIG 136.0 06/02/2018 1533   HDL 39.10 06/02/2018 1533   CHOLHDL 5 06/02/2018 1533   VLDL 27.2 06/02/2018 1533   LDLCALC 118 (H) 06/02/2018 1533    CBC    Component Value Date/Time   WBC 7.1 06/02/2018 1533   RBC 4.27 06/02/2018 1533   HGB 13.4 06/02/2018 1533   HCT 40.1 06/02/2018 1533   PLT 198.0 06/02/2018 1533   MCV 94.0 06/02/2018 1533   MCH 30.5 01/27/2015 1118   MCHC 33.5 06/02/2018 1533   RDW 14.4 06/02/2018 1533   LYMPHSABS 1.4 01/27/2015 1118   MONOABS 1.0 01/27/2015 1118   EOSABS 0.0 01/27/2015 1118   BASOSABS 0.1 01/27/2015 1118    Hgb A1C No results found for:  HGBA1C    Assessment and Plan:   Medicare Annual Wellness Visit:  Diet: Heart healthy or DM if diabetic Physical activity: Sedentary Depression/mood screen: Negative Hearing: Intact to whispered voice Visual acuity: Grossly normal, performs annual eye exam  ADLs: Capable Fall risk: None Home safety: Good Cognitive evaluation: Intact to orientation, naming, recall and repetition EOL planning: Adv directives, full code/ I agree  Preventative Medicine:   Next appointment:   Webb Silversmith, NP

## 2019-10-11 DIAGNOSIS — G894 Chronic pain syndrome: Secondary | ICD-10-CM | POA: Diagnosis not present

## 2019-10-11 DIAGNOSIS — M5440 Lumbago with sciatica, unspecified side: Secondary | ICD-10-CM | POA: Diagnosis not present

## 2019-10-11 DIAGNOSIS — Z79899 Other long term (current) drug therapy: Secondary | ICD-10-CM | POA: Diagnosis not present

## 2019-10-11 DIAGNOSIS — M542 Cervicalgia: Secondary | ICD-10-CM | POA: Diagnosis not present

## 2019-10-12 ENCOUNTER — Other Ambulatory Visit: Payer: Self-pay | Admitting: Internal Medicine

## 2019-10-12 DIAGNOSIS — M48061 Spinal stenosis, lumbar region without neurogenic claudication: Secondary | ICD-10-CM

## 2019-10-18 ENCOUNTER — Telehealth: Payer: Self-pay

## 2019-10-18 NOTE — Telephone Encounter (Signed)
Yes, I recommend he get the vaccine

## 2019-10-18 NOTE — Telephone Encounter (Signed)
Patient just wanted to make sure with Rollene Fare that it is okay for him to get his COVID vaccine before he schedules an appointment. He states that with his health history, he did not want to make the appointment without hearing it was okay from [facility] first.

## 2019-10-19 NOTE — Telephone Encounter (Signed)
Left detailed msg on VM per HIPAA  

## 2019-11-04 ENCOUNTER — Other Ambulatory Visit: Payer: Self-pay

## 2019-11-08 ENCOUNTER — Encounter: Payer: Medicare Other | Admitting: Internal Medicine

## 2019-11-08 ENCOUNTER — Ambulatory Visit: Payer: Medicare Other | Admitting: Internal Medicine

## 2019-11-08 NOTE — Progress Notes (Deleted)
HPI:  Pt presents to the clinic today for his subsequent annual Medicare Wellness Exam. He is also due to follow up chronic conditions.  Frequent Headaches: Managed with Baclofen, Gabapentin and MS Contin. He follows with pain management.  HTN: His BP today is. He does not take any antihypertensive medications. ECG from 01/2015 reviewed.  Spinal Stenosis: Debilitating. He walks with a rolling walker. He takes Gabapentin, Baclofen and MS Contin as prescribed by pain management.  Insomnia: He has trouble staying asleep. He takes Valium at bedtime with good relief. There is no sleep study on file.   Past Medical History:  Diagnosis Date  . Frequent headaches   . History of colon polyps   . Hypertension   . Spinal stenosis     Current Outpatient Medications  Medication Sig Dispense Refill  . baclofen (LIORESAL) 10 MG tablet Take 1 tablet (10 mg total) by mouth 2 (two) times daily. 180 tablet 3  . cephALEXin (KEFLEX) 500 MG capsule Take 1 capsule (500 mg total) by mouth 4 (four) times daily. 28 capsule 0  . Cholecalciferol (RA VITAMIN D-3) 2000 UNITS CAPS Take 1 capsule by mouth daily.     . Cholecalciferol (VITAMIN D3) 5000 units CAPS Take 1 capsule by mouth daily.    . diazepam (VALIUM) 5 MG tablet Take 0.5 tablets by mouth at bedtime.    . diclofenac (VOLTAREN) 75 MG EC tablet Take 1 tablet (75 mg total) by mouth 2 (two) times daily. 30 tablet 5  . docusate sodium (COLACE) 100 MG capsule Take 100 mg by mouth 2 (two) times daily.    Marland Kitchen gabapentin (NEURONTIN) 600 MG tablet TAKE 1 AND 1/2 TABLETS(900 MG) BY MOUTH THREE TIMES DAILY 405 tablet 0  . loratadine (CLARITIN) 10 MG tablet Take 10 mg by mouth daily.    Marland Kitchen morphine (MS CONTIN) 60 MG 12 hr tablet Take 1 tablet (60 mg total) by mouth 3 (three) times daily. 90 tablet 0  . nystatin ointment (MYCOSTATIN) Apply 1 application topically 2 (two) times daily. 30 g 0  . tamsulosin (FLOMAX) 0.4 MG CAPS capsule TAKE 1 CAPSULE(0.4 MG) BY MOUTH  DAILY 90 capsule 0   No current facility-administered medications for this visit.    Allergies  Allergen Reactions  . Bee Venom Swelling  . Poison Ivy Extract [Poison Ivy Extract] Swelling    Family History  Problem Relation Age of Onset  . Cancer Mother        breast  . Alcohol abuse Father   . Heart disease Father   . Heart disease Brother   . Hypertension Brother     Social History   Socioeconomic History  . Marital status: Widowed    Spouse name: Not on file  . Number of children: Not on file  . Years of education: Not on file  . Highest education level: Not on file  Occupational History  . Not on file  Tobacco Use  . Smoking status: Former Smoker    Quit date: 08/15/2011    Years since quitting: 8.2  . Smokeless tobacco: Never Used  Substance and Sexual Activity  . Alcohol use: No    Alcohol/week: 0.0 standard drinks    Comment: sober--since 2002  . Drug use: No  . Sexual activity: Not Currently  Other Topics Concern  . Not on file  Social History Narrative  . Not on file   Social Determinants of Health   Financial Resource Strain:   . Difficulty of Paying Living  Expenses:   Food Insecurity:   . Worried About Charity fundraiser in the Last Year:   . Arboriculturist in the Last Year:   Transportation Needs:   . Film/video editor (Medical):   Marland Kitchen Lack of Transportation (Non-Medical):   Physical Activity:   . Days of Exercise per Week:   . Minutes of Exercise per Session:   Stress:   . Feeling of Stress :   Social Connections:   . Frequency of Communication with Friends and Family:   . Frequency of Social Gatherings with Friends and Family:   . Attends Religious Services:   . Active Member of Clubs or Organizations:   . Attends Archivist Meetings:   Marland Kitchen Marital Status:   Intimate Partner Violence:   . Fear of Current or Ex-Partner:   . Emotionally Abused:   Marland Kitchen Physically Abused:   . Sexually Abused:      Hospitiliaztions:  Health Maintenance:    Flu: 03/2018  Tetanus: 01/2015  Zostavax: never  Shingrix: never  PSA: 11//2019  Colon Screening: 08/2013  Eye Doctor:  Dental Exam:   Providers:   PCP: Webb Silversmith, NP  Pain Mangament: Dr. Chancy Milroy  Dermatologist: Dr. Nehemiah Massed     I have personally reviewed and have noted:  1. The patient's medical and social history 2. Their use of alcohol, tobacco or illicit drugs 3. Their current medications and supplements 4. The patient's functional ability including ADL's, fall risks, home safety risks and hearing or visual impairment. 5. Diet and physical activities 6. Evidence for depression or mood disorder  Subjective:   Review of Systems:   Constitutional: Denies fever, malaise, fatigue, headache or abrupt weight changes.  HEENT: Denies eye pain, eye redness, ear pain, ringing in the ears, wax buildup, runny nose, nasal congestion, bloody nose, or sore throat. Respiratory: Denies difficulty breathing, shortness of breath, cough or sputum production.   Cardiovascular: Denies chest pain, chest tightness, palpitations or swelling in the hands or feet.  Gastrointestinal: Denies abdominal pain, bloating, constipation, diarrhea or blood in the stool.  GU: Denies urgency, frequency, pain with urination, burning sensation, blood in urine, odor or discharge. Musculoskeletal: Denies decrease in range of motion, difficulty with gait, muscle pain or joint pain and swelling.  Skin: Denies redness, rashes, lesions or ulcercations.  Neurological: Denies dizziness, difficulty with memory, difficulty with speech or problems with balance and coordination.  Psych: Denies anxiety, depression, SI/HI.  No other specific complaints in a complete review of systems (except as listed in HPI above).  Objective:  PE:   There were no vitals taken for this visit. Wt Readings from Last 3 Encounters:  07/30/18 187 lb (84.8 kg)  06/02/18 193 lb (87.5 kg)   04/09/18 199 lb (90.3 kg)    General: Appears their stated age, well developed, well nourished in NAD. Skin: Warm, dry and intact. No rashes, lesions or ulcerations noted. HEENT: Head: normal shape and size; Eyes: sclera white, no icterus, conjunctiva pink, PERRLA and EOMs intact; Ears: Tm's gray and intact, normal light reflex; Throat/Mouth: Teeth present, mucosa pink and moist, no exudate, lesions or ulcerations noted.  Neck: Neck supple, trachea midline. No masses, lumps or thyromegaly present.  Cardiovascular: Normal rate and rhythm. S1,S2 noted.  No murmur, rubs or gallops noted. No JVD or BLE edema. No carotid bruits noted. Pulmonary/Chest: Normal effort and positive vesicular breath sounds. No respiratory distress. No wheezes, rales or ronchi noted.  Abdomen: Soft and nontender. Normal bowel  sounds. No distention or masses noted. Liver, spleen and kidneys non palpable. Musculoskeletal: Normal range of motion. Strength 5/5 BUE/BLE. No signs of joint swelling.  Neurological: Alert and oriented. Cranial nerves II-XII grossly intact. Coordination normal.  Psychiatric: Mood and affect normal. Behavior is normal. Judgment and thought content normal.   EKG:  BMET    Component Value Date/Time   NA 137 06/02/2018 1533   NA 143 06/11/2017 1257   K 5.2 (H) 06/02/2018 1533   CL 98 06/02/2018 1533   CO2 32 06/02/2018 1533   GLUCOSE 98 06/02/2018 1533   BUN 28 (H) 06/02/2018 1533   BUN 22 06/11/2017 1257   CREATININE 2.18 (H) 06/02/2018 1533   CALCIUM 9.9 06/02/2018 1533   GFRNONAA 49 (L) 06/11/2017 1257   GFRAA 57 (L) 06/11/2017 1257    Lipid Panel     Component Value Date/Time   CHOL 184 06/02/2018 1533   TRIG 136.0 06/02/2018 1533   HDL 39.10 06/02/2018 1533   CHOLHDL 5 06/02/2018 1533   VLDL 27.2 06/02/2018 1533   LDLCALC 118 (H) 06/02/2018 1533    CBC    Component Value Date/Time   WBC 7.1 06/02/2018 1533   RBC 4.27 06/02/2018 1533   HGB 13.4 06/02/2018 1533   HCT  40.1 06/02/2018 1533   PLT 198.0 06/02/2018 1533   MCV 94.0 06/02/2018 1533   MCH 30.5 01/27/2015 1118   MCHC 33.5 06/02/2018 1533   RDW 14.4 06/02/2018 1533   LYMPHSABS 1.4 01/27/2015 1118   MONOABS 1.0 01/27/2015 1118   EOSABS 0.0 01/27/2015 1118   BASOSABS 0.1 01/27/2015 1118    Hgb A1C No results found for: HGBA1C    Assessment and Plan:   Medicare Annual Wellness Visit:  Diet:  Physical activity:  Depression/mood screen: Negative, PHq 9 score of Hearing: Intact to whispered voice Visual acuity: Grossly normal, performs annual eye exam  ADLs: Capable Fall risk: None Home safety: Good Cognitive evaluation: Intact to orientation, naming, recall and repetition EOL planning: Adv directives, full code/ I agree  Preventative Medicine:   Next appointment:   Webb Silversmith, NP This visit occurred during the SARS-CoV-2 public health emergency.  Safety protocols were in place, including screening questions prior to the visit, additional usage of staff PPE, and extensive cleaning of exam room while observing appropriate contact time as indicated for disinfecting solutions.

## 2019-11-10 DIAGNOSIS — M542 Cervicalgia: Secondary | ICD-10-CM | POA: Diagnosis not present

## 2019-11-10 DIAGNOSIS — M5440 Lumbago with sciatica, unspecified side: Secondary | ICD-10-CM | POA: Diagnosis not present

## 2019-11-10 DIAGNOSIS — Z79899 Other long term (current) drug therapy: Secondary | ICD-10-CM | POA: Diagnosis not present

## 2019-11-10 DIAGNOSIS — G8921 Chronic pain due to trauma: Secondary | ICD-10-CM | POA: Diagnosis not present

## 2019-11-10 DIAGNOSIS — G894 Chronic pain syndrome: Secondary | ICD-10-CM | POA: Diagnosis not present

## 2019-11-11 ENCOUNTER — Other Ambulatory Visit: Payer: Self-pay | Admitting: Internal Medicine

## 2019-11-15 ENCOUNTER — Ambulatory Visit: Payer: Medicare Other | Admitting: Internal Medicine

## 2019-11-16 ENCOUNTER — Ambulatory Visit (INDEPENDENT_AMBULATORY_CARE_PROVIDER_SITE_OTHER): Payer: Medicare Other | Admitting: Internal Medicine

## 2019-11-16 ENCOUNTER — Encounter: Payer: Self-pay | Admitting: Internal Medicine

## 2019-11-16 ENCOUNTER — Other Ambulatory Visit: Payer: Self-pay | Admitting: Internal Medicine

## 2019-11-16 ENCOUNTER — Other Ambulatory Visit: Payer: Self-pay

## 2019-11-16 VITALS — BP 110/70 | HR 103 | Temp 97.5°F | Wt 177.0 lb

## 2019-11-16 DIAGNOSIS — L700 Acne vulgaris: Secondary | ICD-10-CM | POA: Diagnosis not present

## 2019-11-16 MED ORDER — SPIRONOLACTONE 50 MG PO TABS: 50.0000 mg | ORAL_TABLET | Freq: Every day | ORAL | 0 refills | Status: DC

## 2019-11-16 NOTE — Progress Notes (Signed)
Subjective:    Patient ID: Samuel Burke, male    DOB: 1960-11-10, 59 y.o.   MRN: [id number]  HPI  Pt presents to the clinic today with c/o rash of beard. He has been seen multiple times for the same. 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 He reports swelling under the skin. He is able to pop the lesion and they drain. He puts topical Neosporin on them with minimal relief. He has seen derm in the past, been on multiple abx in the cream with little efficacy.  Review of Systems      Past Medical History:  Diagnosis Date  . Frequent headaches   . History of colon polyps   . Hypertension   . Spinal stenosis     Current Outpatient Medications  Medication Sig Dispense Refill  . baclofen (LIORESAL) 10 MG tablet Take 1 tablet (10 mg total) by mouth 2 (two) times daily. 180 tablet 3  . cephALEXin (KEFLEX) 500 MG capsule Take 1 capsule (500 mg total) by mouth 4 (four) times daily. 28 capsule 0  . Cholecalciferol (RA VITAMIN D-3) 2000 UNITS CAPS Take 1 capsule by mouth daily.     . Cholecalciferol (VITAMIN D3) 5000 units CAPS Take 1 capsule by mouth daily.    . diazepam (VALIUM) 5 MG tablet Take 0.5 tablets by mouth at bedtime.    . diclofenac (VOLTAREN) 75 MG EC tablet Take 1 tablet (75 mg total) by mouth 2 (two) times daily. 30 tablet 5  . docusate sodium (COLACE) 100 MG capsule Take 100 mg by mouth 2 (two) times daily.    Marland Kitchen gabapentin (NEURONTIN) 600 MG tablet TAKE 1 AND 1/2 TABLETS(900 MG) BY MOUTH THREE TIMES DAILY 405 tablet 0  . loratadine (CLARITIN) 10 MG tablet Take 10 mg by mouth daily.    Marland Kitchen morphine (MS CONTIN) 60 MG 12 hr tablet Take 1 tablet (60 mg total) by mouth 3 (three) times daily. 90 tablet 0  . nystatin ointment (MYCOSTATIN) Apply 1 application topically 2 (two) times daily. 30 g 0  . tamsulosin (FLOMAX) 0.4 MG CAPS capsule TAKE 1 CAPSULE(0.4 MG) BY MOUTH DAILY 90 capsule 0   No current  facility-administered medications for this visit.    Allergies  Allergen Reactions  . Bee Venom Swelling  . Poison Ivy Extract [Poison Ivy Extract] Swelling    Family History  Problem Relation Age of Onset  . Cancer Mother        breast  . Alcohol abuse Father   . Heart disease Father   . Heart disease Brother   . Hypertension Brother     Social History   Socioeconomic History  . Marital status: Widowed    Spouse name: Not on file  . Number of children: Not on file  . Years of education: Not on file  . Highest education level: Not on file  Occupational History  . Not on file  Tobacco Use  . Smoking status: Former Smoker    Quit date: 08/15/2011    Years since quitting: 8.2  . Smokeless tobacco: Never Used  Substance and Sexual Activity  . Alcohol use: No    Alcohol/week: 0.0 standard drinks    Comment: sober--since 2002  . Drug use: No  . Sexual activity: Not Currently  Other Topics Concern  . Not on file  Social History Narrative  . Not on file   Social Determinants of Health  Financial Resource Strain:   . Difficulty of Paying Living Expenses:   Food Insecurity:   . Worried About Charity fundraiser in the Last Year:   . Arboriculturist in the Last Year:   Transportation Needs:   . Film/video editor (Medical):   Marland Kitchen Lack of Transportation (Non-Medical):   Physical Activity:   . Days of Exercise per Week:   . Minutes of Exercise per Session:   Stress:   . Feeling of Stress :   Social Connections:   . Frequency of Communication with Friends and Family:   . Frequency of Social Gatherings with Friends and Family:   . Attends Religious Services:   . Active Member of Clubs or Organizations:   . Attends Archivist Meetings:   Marland Kitchen Marital Status:   Intimate Partner Violence:   . Fear of Current or Ex-Partner:   . Emotionally Abused:   Marland Kitchen Physically Abused:   . Sexually Abused:      Constitutional: Denies fever, malaise, fatigue, headache  or abrupt weight changes.  Respiratory: Denies difficulty breathing, shortness of breath, cough or sputum production.   Cardiovascular: Denies chest pain, chest tightness, palpitations or swelling in the hands or feet.  Skin: Pt reports rash of face. Denies ulcercations.    No other specific complaints in a complete review of systems (except as listed in HPI above).  Objective:   Physical Exam  BP 110/70   Pulse (!) 103   Temp (!) 97.5 F (36.4 C) (Temporal)   Wt 177 lb (80.3 kg)   SpO2 97%   BMI 28.57 kg/m    Wt Readings from Last 3 Encounters:  07/30/18 187 lb (84.8 kg)  06/02/18 193 lb (87.5 kg)  04/09/18 199 lb (90.3 kg)    General: Appears his stated age, well developed, well nourished in NAD. Skin: Cystic acne noted of chin. Cardiovascular: Normal rate and rhythm. Neurological: Alert and oriented.   BMET    Component Value Date/Time   NA 137 06/02/2018 1533   NA 143 06/11/2017 1257   K 5.2 (H) 06/02/2018 1533   CL 98 06/02/2018 1533   CO2 32 06/02/2018 1533   GLUCOSE 98 06/02/2018 1533   BUN 28 (H) 06/02/2018 1533   BUN 22 06/11/2017 1257   CREATININE 2.18 (H) 06/02/2018 1533   CALCIUM 9.9 06/02/2018 1533   GFRNONAA 49 (L) 06/11/2017 1257   GFRAA 57 (L) 06/11/2017 1257    Lipid Panel     Component Value Date/Time   CHOL 184 06/02/2018 1533   TRIG 136.0 06/02/2018 1533   HDL 39.10 06/02/2018 1533   CHOLHDL 5 06/02/2018 1533   VLDL 27.2 06/02/2018 1533   LDLCALC 118 (H) 06/02/2018 1533    CBC    Component Value Date/Time   WBC 7.1 06/02/2018 1533   RBC 4.27 06/02/2018 1533   HGB 13.4 06/02/2018 1533   HCT 40.1 06/02/2018 1533   PLT 198.0 06/02/2018 1533   MCV 94.0 06/02/2018 1533   MCH 30.5 01/27/2015 1118   MCHC 33.5 06/02/2018 1533   RDW 14.4 06/02/2018 1533   LYMPHSABS 1.4 01/27/2015 1118   MONOABS 1.0 01/27/2015 1118   EOSABS 0.0 01/27/2015 1118   BASOSABS 0.1 01/27/2015 1118    Hgb A1C No results found for: HGBA1C           Assessment & Plan:   Cystic Acne:  BMET today RX for Spironolactone 50 mg daily He would like further imaging with  ultrasound or CT, will defer to next encounter  Return precautions discussed Webb Silversmith, NP This visit occurred during the SARS-CoV-2 public health emergency.  Safety protocols were in place, including screening questions prior to the visit, additional usage of staff PPE, and extensive cleaning of exam room while observing appropriate contact time as indicated for disinfecting solutions.

## 2019-11-17 LAB — BASIC METABOLIC PANEL
BUN: 15 mg/dL (ref 6–23)
CO2: 32 mEq/L (ref 19–32)
Calcium: 9.6 mg/dL (ref 8.4–10.5)
Chloride: 101 mEq/L (ref 96–112)
Creatinine, Ser: 1.07 mg/dL (ref 0.40–1.50)
GFR: 70.31 mL/min (ref >60.00)
Glucose, Bld: 113 mg/dL — ABNORMAL HIGH (ref 70–99)
Potassium: 3.8 mEq/L (ref 3.5–5.1)
Sodium: 140 mEq/L (ref 135–145)

## 2019-11-23 DIAGNOSIS — I10 Essential (primary) hypertension: Secondary | ICD-10-CM | POA: Diagnosis not present

## 2019-11-23 DIAGNOSIS — N182 Chronic kidney disease, stage 2 (mild): Secondary | ICD-10-CM | POA: Diagnosis not present

## 2019-11-26 ENCOUNTER — Telehealth: Payer: Self-pay | Admitting: Internal Medicine

## 2019-11-26 NOTE — Telephone Encounter (Signed)
Pt states that the medication is not working and he is wanting to move forward with the imaging recommended.  Please advise, thanks.   Assessment & Plan: 11/16/2019   Cystic Acne:  BMET today RX for Spironolactone 50 mg daily He would like further imaging with ultrasound or CT, will defer to next encounter

## 2019-11-27 NOTE — Telephone Encounter (Cosign Needed)
He needs to give this medication at least a month to work. Then we can discuss imaging.

## 2019-11-29 NOTE — Telephone Encounter (Signed)
Pt is aware as instructed and expressed understanding to give more time

## 2019-12-07 ENCOUNTER — Ambulatory Visit (INDEPENDENT_AMBULATORY_CARE_PROVIDER_SITE_OTHER): Payer: Medicare Other | Admitting: Internal Medicine

## 2019-12-07 ENCOUNTER — Other Ambulatory Visit: Payer: Self-pay

## 2019-12-07 ENCOUNTER — Encounter: Payer: Self-pay | Admitting: Internal Medicine

## 2019-12-07 VITALS — BP 122/76 | HR 106 | Temp 98.8°F | Ht 66.0 in | Wt 175.0 lb

## 2019-12-07 DIAGNOSIS — R519 Headache, unspecified: Secondary | ICD-10-CM | POA: Diagnosis not present

## 2019-12-07 DIAGNOSIS — Z125 Encounter for screening for malignant neoplasm of prostate: Secondary | ICD-10-CM | POA: Diagnosis not present

## 2019-12-07 DIAGNOSIS — Z0000 Encounter for general adult medical examination without abnormal findings: Secondary | ICD-10-CM

## 2019-12-07 DIAGNOSIS — N401 Enlarged prostate with lower urinary tract symptoms: Secondary | ICD-10-CM

## 2019-12-07 DIAGNOSIS — R3914 Feeling of incomplete bladder emptying: Secondary | ICD-10-CM

## 2019-12-07 DIAGNOSIS — M48061 Spinal stenosis, lumbar region without neurogenic claudication: Secondary | ICD-10-CM

## 2019-12-07 DIAGNOSIS — F5101 Primary insomnia: Secondary | ICD-10-CM | POA: Diagnosis not present

## 2019-12-07 DIAGNOSIS — R220 Localized swelling, mass and lump, head: Secondary | ICD-10-CM

## 2019-12-07 DIAGNOSIS — I10 Essential (primary) hypertension: Secondary | ICD-10-CM

## 2019-12-07 LAB — CBC
HCT: 46.5 % (ref 39.0–52.0)
Hemoglobin: 15.9 g/dL (ref 13.0–17.0)
MCHC: 34.3 g/dL (ref 30.0–36.0)
MCV: 93.9 fl (ref 78.0–100.0)
Platelets: 221 10*3/uL (ref 150.0–400.0)
RBC: 4.95 Mil/uL (ref 4.22–5.81)
RDW: 13.5 % (ref 11.5–15.5)
WBC: 10 10*3/uL (ref 4.0–10.5)

## 2019-12-07 LAB — COMPREHENSIVE METABOLIC PANEL
ALT: 20 U/L (ref 0–53)
AST: 16 U/L (ref 0–37)
Albumin: 4.4 g/dL (ref 3.5–5.2)
Alkaline Phosphatase: 62 U/L (ref 39–117)
BUN: 19 mg/dL (ref 6–23)
CO2: 32 mEq/L (ref 19–32)
Calcium: 10.2 mg/dL (ref 8.4–10.5)
Chloride: 99 mEq/L (ref 96–112)
Creatinine, Ser: 1.19 mg/dL (ref 0.40–1.50)
GFR: 62.18 mL/min (ref >60.00)
Glucose, Bld: 85 mg/dL (ref 70–99)
Potassium: 5.1 mEq/L (ref 3.5–5.1)
Sodium: 138 mEq/L (ref 135–145)
Total Bilirubin: 0.6 mg/dL (ref 0.2–1.2)
Total Protein: 6.8 g/dL (ref 6.0–8.3)

## 2019-12-07 LAB — LIPID PANEL
Cholesterol: 163 mg/dL (ref 0–200)
HDL: 36 mg/dL — ABNORMAL LOW (ref >39.00)
LDL Cholesterol: 88 mg/dL (ref 0–99)
NonHDL: 126.92
Total CHOL/HDL Ratio: 5
Triglycerides: 195 mg/dL — ABNORMAL HIGH (ref 0.0–149.0)
VLDL: 39 mg/dL (ref 0.0–40.0)

## 2019-12-07 LAB — PSA, MEDICARE: PSA: 0.31 ng/ml (ref 0.10–4.00)

## 2019-12-07 MED ORDER — ZOSTER VAC RECOMB ADJUVANTED 50 MCG/0.5ML IM SUSR: 0.5000 mL | Freq: Once | INTRAMUSCULAR | 0 refills | Status: AC

## 2019-12-07 NOTE — Patient Instructions (Signed)

## 2019-12-07 NOTE — Assessment & Plan Note (Signed)
Doing okay off meds Will monitor

## 2019-12-07 NOTE — Progress Notes (Signed)
HPI:  Pt presents to the clinic today for his subsequent annual Medicare Wellness Exam. He is also due to follow up chronic conditions.  Frequent Headaches: Secondary to chronic neck pain. He takes Baclofen, Gabapentin and MS Contin as prescribed by pain management.   HTN: His BP today is 122/76. He is not taking any antihypertensive medications at this time. ECG from 01/2015 reviewed.  Spinal Stenosis: Debilitating. He walks with a cane. He takes Baclofen, Diclofenac, Gabapentin and MS Contin as prescribed by pain management.  Insomnia: He has trouble staying asleep. He is not taking anything for sleep at this time. There is no sleep study on file.  CKD 3: His last GFR was 70.01, 10/2019. He is not on an ACE/ARB. He follows with nephrology.  BPH: He is taking Flomax as prescribed.  He would also like to follow up re: bumps on his chins. These have flared intermittently for the last few years. He has been treated for folliculitis barbae with antibiotics and was started on Spironolactone at his last visit. He does not feel like the Spironolactone has not been effective. He has seen dermatology in the past which has not helped. He would like an ultrasound of his face for further evaluation.  Past Medical History:  Diagnosis Date  . Frequent headaches   . History of colon polyps   . Hypertension   . Spinal stenosis     Current Outpatient Medications  Medication Sig Dispense Refill  . baclofen (LIORESAL) 10 MG tablet Take 1 tablet (10 mg total) by mouth 2 (two) times daily. (Patient taking differently: Take 20 mg by mouth 2 (two) times daily. ) 180 tablet 3  . diclofenac (CATAFLAM) 50 MG tablet Take 50 mg by mouth 3 (three) times daily.    Marland Kitchen docusate sodium (COLACE) 100 MG capsule Take 100 mg by mouth 2 (two) times daily.    Marland Kitchen gabapentin (NEURONTIN) 600 MG tablet TAKE 1 AND 1/2 TABLETS(900 MG) BY MOUTH THREE TIMES DAILY 405 tablet 0  . loratadine (CLARITIN) 10 MG tablet Take 10 mg by mouth  daily.    Marland Kitchen morphine (MS CONTIN) 60 MG 12 hr tablet Take 1 tablet (60 mg total) by mouth 3 (three) times daily. 90 tablet 0  . spironolactone (ALDACTONE) 50 MG tablet TAKE 1 TABLET(50 MG) BY MOUTH DAILY 90 tablet 0  . tamsulosin (FLOMAX) 0.4 MG CAPS capsule TAKE 1 CAPSULE(0.4 MG) BY MOUTH DAILY 90 capsule 0  . VITAMIN D, CHOLECALCIFEROL, PO Take 900 Units by mouth daily.     No current facility-administered medications for this visit.    Allergies  Allergen Reactions  . Bee Venom Swelling  . Poison Ivy Extract [Poison Ivy Extract] Swelling    Family History  Problem Relation Age of Onset  . Cancer Mother        breast  . Alcohol abuse Father   . Heart disease Father   . Heart disease Brother   . Hypertension Brother     Social History   Socioeconomic History  . Marital status: Widowed    Spouse name: Not on file  . Number of children: Not on file  . Years of education: Not on file  . Highest education level: Not on file  Occupational History  . Not on file  Tobacco Use  . Smoking status: Former Smoker    Quit date: 08/15/2011    Years since quitting: 8.3  . Smokeless tobacco: Never Used  Substance and Sexual Activity  . Alcohol  use: No    Alcohol/week: 0.0 standard drinks    Comment: sober--since 2002  . Drug use: No  . Sexual activity: Not Currently  Other Topics Concern  . Not on file  Social History Narrative  . Not on file   Social Determinants of Health   Financial Resource Strain:   . Difficulty of Paying Living Expenses:   Food Insecurity:   . Worried About Charity fundraiser in the Last Year:   . Arboriculturist in the Last Year:   Transportation Needs:   . Film/video editor (Medical):   Marland Kitchen Lack of Transportation (Non-Medical):   Physical Activity:   . Days of Exercise per Week:   . Minutes of Exercise per Session:   Stress:   . Feeling of Stress :   Social Connections:   . Frequency of Communication with Friends and Family:   .  Frequency of Social Gatherings with Friends and Family:   . Attends Religious Services:   . Active Member of Clubs or Organizations:   . Attends Archivist Meetings:   Marland Kitchen Marital Status:   Intimate Partner Violence:   . Fear of Current or Ex-Partner:   . Emotionally Abused:   Marland Kitchen Physically Abused:   . Sexually Abused:     Hospitiliaztions: None  Health Maintenance:    Flu: 03/2018  Tetanus: 01/2015  Shingrix: never  Covid: 11/26/19 (pfizer)  PSA: 05/2018  Colon Screening: 08/2013  Eye Doctor: as needed  Dental Exam: as needed   Providers:   PCP: Webb Silversmith, NP-C  Pain Management: Dr. Chancy Milroy  Nephrology: Dr. Candiss Norse  Neurology: Dr. Manuella Ghazi   I have personally reviewed and have noted:  1. The patient's medical and social history 2. Their use of alcohol, tobacco or illicit drugs 3. Their current medications and supplements 4. The patient's functional ability including ADL's, fall risks, home safety risks and hearing or visual impairment. 5. Diet and physical activities 6. Evidence for depression or mood disorder  Subjective:   Review of Systems:   Constitutional: Pt reports intermittent headaches. Denies fever, malaise, fatigue, or abrupt weight changes.  HEENT: Denies eye pain, eye redness, ear pain, ringing in the ears, wax buildup, runny nose, nasal congestion, bloody nose, or sore throat. Respiratory: Denies difficulty breathing, shortness of breath, cough or sputum production.   Cardiovascular: Denies chest pain, chest tightness, palpitations or swelling in the hands or feet.  Gastrointestinal: Pt reports intermittent constipation. Denies abdominal pain, bloating, diarrhea or blood in the stool.  GU: Denies urgency, frequency, pain with urination, burning sensation, blood in urine, odor or discharge. Musculoskeletal: Pt reports chronic neck and back pain. Denies decrease in range of motion, difficulty with gait, or joint swelling.  Skin: Pt reports bumps of chin.  Denies redness, rashes, or ulcercations.  Neurological: Denies dizziness, difficulty with memory, difficulty with speech.  Psych: Denies anxiety, depression, SI/HI.  No other specific complaints in a complete review of systems (except as listed in HPI above).  Objective:  PE:   BP 122/76 (BP Location: Left Arm, Patient Position: Sitting, Cuff Size: Normal)   Pulse (!) 106   Temp 98.8 F (37.1 C) (Temporal)   Ht 5\' 6"  (1.676 m)   Wt 175 lb (79.4 kg)   SpO2 97%   BMI 28.25 kg/m   Wt Readings from Last 3 Encounters:  11/16/19 177 lb (80.3 kg)  07/30/18 187 lb (84.8 kg)  06/02/18 193 lb (87.5 kg)    General:  Appears his stated age, chronically ill appearing, in NAD. Skin: Warm, dry and intact. Subcutaneous bumps noted right side of chin. HEENT: Head: normal shape and size; Eyes: sclera white, no icterus, conjunctiva pink, PERRLA and EOMs intact;  Neck: Neck supple, trachea midline. No masses, lumps or thyromegaly present.  Cardiovascular: Normal rate and rhythm. S1,S2 noted.  No murmur, rubs or gallops noted. No JVD or BLE edema. No carotid bruits noted. Pulmonary/Chest: Normal effort and positive vesicular breath sounds. No respiratory distress. No wheezes, rales or ronchi noted.  Abdomen: Soft and nontender. Normal bowel sounds. No distention or masses noted. Liver, spleen and kidneys non palpable. Musculoskeletal:  Strength 4/5 BUE/BLE. Gait slow and steady with use of cane. Neurological: Alert and oriented. Cranial nerves II-XII grossly intact. Coordination normal.  Psychiatric: Mood and affect normal. Behavior is normal. Judgment and thought content normal.     BMET    Component Value Date/Time   NA 140 11/16/2019 1535   NA 143 06/11/2017 1257   K 3.8 11/16/2019 1535   CL 101 11/16/2019 1535   CO2 32 11/16/2019 1535   GLUCOSE 113 (H) 11/16/2019 1535   BUN 15 11/16/2019 1535   BUN 22 06/11/2017 1257   CREATININE 1.07 11/16/2019 1535   CALCIUM 9.6 11/16/2019 1535    GFRNONAA 49 (L) 06/11/2017 1257   GFRAA 57 (L) 06/11/2017 1257    Lipid Panel     Component Value Date/Time   CHOL 184 06/02/2018 1533   TRIG 136.0 06/02/2018 1533   HDL 39.10 06/02/2018 1533   CHOLHDL 5 06/02/2018 1533   VLDL 27.2 06/02/2018 1533   LDLCALC 118 (H) 06/02/2018 1533    CBC    Component Value Date/Time   WBC 7.1 06/02/2018 1533   RBC 4.27 06/02/2018 1533   HGB 13.4 06/02/2018 1533   HCT 40.1 06/02/2018 1533   PLT 198.0 06/02/2018 1533   MCV 94.0 06/02/2018 1533   MCH 30.5 01/27/2015 1118   MCHC 33.5 06/02/2018 1533   RDW 14.4 06/02/2018 1533   LYMPHSABS 1.4 01/27/2015 1118   MONOABS 1.0 01/27/2015 1118   EOSABS 0.0 01/27/2015 1118   BASOSABS 0.1 01/27/2015 1118    Hgb A1C No results found for: HGBA1C    Assessment and Plan:   Medicare Annual Wellness Visit:  Diet: He does eat meat. He consumes more veggies, not many fruits. He tries to avoid fried foods. He drinks mostly water, gatorade. Physical activity: Walkng Depression/mood screen: Negative, PHQ 9 score of 0 Hearing: Intact to whispered voice Visual acuity: Grossly normal, performs annual eye exam  ADLs: Capable with use of a cane. Fall risk: High due to spinal stenosis Home safety: Good Cognitive evaluation: Intact to orientation, naming, recall and repetition EOL planning: Living Will, full code/ I agree  Preventative Medicine: Encouraged him to get a flu shot in the fall. Teta.nus UTD. He will be getting his second Covid vaccine soon. RX for Shingrix sent to pharmacy but advised him to wait 30 days in between vaccines. Colon screening UTD. Encouraged him to consume a balanced diet and exercise regimen. Advised him to see an eye doctor and dentist annually. Will check CBC, CMET, Lipid, and PSA today.  Bump of Chin:  Has failed treatment for folliculitis barbae Has failed treatment for cystic acne He would like ultrasound- ordered  Next appointment: 1 year Medicare Wellness  Exam   Webb Silversmith, NP This visit occurred during the SARS-CoV-2 public health emergency.  Safety protocols were in place, including  screening questions prior to the visit, additional usage of staff PPE, and extensive cleaning of exam room while observing appropriate contact time as indicated for disinfecting solutions.

## 2019-12-07 NOTE — Assessment & Plan Note (Signed)
Continue Flomax PSA today 

## 2019-12-07 NOTE — Assessment & Plan Note (Signed)
Controlled off meds  Will monitor 

## 2019-12-07 NOTE — Assessment & Plan Note (Signed)
Debilitating Continue Baclofen, Diclofenac, Gabapentin and MS Contin prescribed by pain management

## 2019-12-07 NOTE — Assessment & Plan Note (Signed)
Continue Baclofen, Gabapentin and MS contin prescribed by pain management

## 2019-12-11 ENCOUNTER — Other Ambulatory Visit: Payer: Self-pay | Admitting: Internal Medicine

## 2019-12-16 ENCOUNTER — Other Ambulatory Visit: Payer: Medicare Other

## 2019-12-17 ENCOUNTER — Encounter: Payer: Self-pay | Admitting: Internal Medicine

## 2019-12-17 MED ORDER — DICLOFENAC POTASSIUM 50 MG PO TABS: 50.0000 mg | ORAL_TABLET | Freq: Three times a day (TID) | ORAL | 1 refills | Status: AC

## 2019-12-28 ENCOUNTER — Ambulatory Visit
Admission: RE | Admit: 2019-12-28 | Discharge: 2019-12-28 | Disposition: A | Discharge status: Home / Self Care | Payer: Medicare Other | Source: Ambulatory Visit | Attending: Internal Medicine | Admitting: Internal Medicine

## 2019-12-28 ENCOUNTER — Other Ambulatory Visit: Payer: Self-pay

## 2019-12-28 DIAGNOSIS — R220 Localized swelling, mass and lump, head: Secondary | ICD-10-CM

## 2019-12-28 DIAGNOSIS — R221 Localized swelling, mass and lump, neck: Secondary | ICD-10-CM | POA: Diagnosis not present

## 2019-12-28 DIAGNOSIS — Z0000 Encounter for general adult medical examination without abnormal findings: Secondary | ICD-10-CM

## 2019-12-30 ENCOUNTER — Encounter: Payer: Self-pay | Admitting: Internal Medicine

## 2019-12-30 DIAGNOSIS — L989 Disorder of the skin and subcutaneous tissue, unspecified: Secondary | ICD-10-CM

## 2020-01-10 DIAGNOSIS — G8921 Chronic pain due to trauma: Secondary | ICD-10-CM | POA: Diagnosis not present

## 2020-01-10 DIAGNOSIS — G894 Chronic pain syndrome: Secondary | ICD-10-CM | POA: Diagnosis not present

## 2020-01-10 DIAGNOSIS — M5440 Lumbago with sciatica, unspecified side: Secondary | ICD-10-CM | POA: Diagnosis not present

## 2020-01-10 DIAGNOSIS — Z79899 Other long term (current) drug therapy: Secondary | ICD-10-CM | POA: Diagnosis not present

## 2020-01-10 DIAGNOSIS — M542 Cervicalgia: Secondary | ICD-10-CM | POA: Diagnosis not present

## 2020-01-24 DIAGNOSIS — L738 Other specified follicular disorders: Secondary | ICD-10-CM | POA: Diagnosis not present

## 2020-02-12 ENCOUNTER — Other Ambulatory Visit: Payer: Self-pay | Admitting: Internal Medicine

## 2020-02-21 ENCOUNTER — Telehealth: Payer: Self-pay | Admitting: Internal Medicine

## 2020-02-21 NOTE — Telephone Encounter (Signed)
Patient called in stating he would like to discuss with Samuel Burke a diagnosis from the dermatologist. Patient was seen 2 weeks ago and has not been updated of what the next step will be. Patient would like to see if PCP has any advice for this. Please advise.

## 2020-02-21 NOTE — Telephone Encounter (Signed)
What was the diagnosis and what are his concerns. If lengthy, he may want to schedule an appt to discuss.

## 2020-02-23 DIAGNOSIS — G894 Chronic pain syndrome: Secondary | ICD-10-CM | POA: Diagnosis not present

## 2020-02-24 NOTE — Telephone Encounter (Signed)
I have been unsuccessful in treating this, which is why he was sent to the specialist. He needs to continue to try to reach out to them for the next steps.

## 2020-02-24 NOTE — Telephone Encounter (Signed)
Pt calling back for response of call placed by pt on 02/21/20. Diagnosis is something to do with hair follicle in neck such as an infection. Pt was trying to say something ? Folliculitis. Pt has not heard from dermatologist and if shirt rubs against the area it "kills pt". Pt has called dermatologist office for next steps but has not had responses to his calls. Pt wants to know if needs a month long of abx; pt wants to get this situation under control. Pt needs 2 wks notice for transportation. Pt scheduled 30' appt on 03/14/20 at 3:45. Pt wants to know if something could be done prior to that appt or does pt need appt. Pt request cb after Avie Echevaria NP reviews. [facility].

## 2020-02-25 NOTE — Telephone Encounter (Signed)
Left detailed msg on VM per HIPAA  

## 2020-03-06 DIAGNOSIS — M5440 Lumbago with sciatica, unspecified side: Secondary | ICD-10-CM | POA: Diagnosis not present

## 2020-03-06 DIAGNOSIS — M542 Cervicalgia: Secondary | ICD-10-CM | POA: Diagnosis not present

## 2020-03-06 DIAGNOSIS — Z79899 Other long term (current) drug therapy: Secondary | ICD-10-CM | POA: Diagnosis not present

## 2020-03-06 DIAGNOSIS — G894 Chronic pain syndrome: Secondary | ICD-10-CM | POA: Diagnosis not present

## 2020-03-14 ENCOUNTER — Encounter: Payer: Self-pay | Admitting: Internal Medicine

## 2020-03-14 ENCOUNTER — Other Ambulatory Visit: Payer: Self-pay

## 2020-03-14 ENCOUNTER — Ambulatory Visit (INDEPENDENT_AMBULATORY_CARE_PROVIDER_SITE_OTHER): Payer: Medicare Other | Admitting: Internal Medicine

## 2020-03-14 VITALS — BP 120/84 | HR 81 | Temp 97.8°F | Wt 171.0 lb

## 2020-03-14 DIAGNOSIS — M48061 Spinal stenosis, lumbar region without neurogenic claudication: Secondary | ICD-10-CM | POA: Diagnosis not present

## 2020-03-14 DIAGNOSIS — L738 Other specified follicular disorders: Secondary | ICD-10-CM

## 2020-03-14 DIAGNOSIS — N3943 Post-void dribbling: Secondary | ICD-10-CM

## 2020-03-14 MED ORDER — DICLOFENAC SODIUM 1 % EX GEL: 2.0000 g | Freq: Four times a day (QID) | CUTANEOUS | 0 refills | Status: AC

## 2020-03-14 MED ORDER — DOXYCYCLINE HYCLATE 100 MG PO TABS: 100.0000 mg | ORAL_TABLET | Freq: Two times a day (BID) | ORAL | 0 refills | Status: AC

## 2020-03-14 NOTE — Progress Notes (Signed)
Subjective:    Patient ID: Samuel Burke, male    DOB: 10-26-1960, 59 y.o.   MRN: [id number]  HPI  Patient presents to the clinic today to follow-up rash of neck.  He has been seen for the same multiple times at this clinic including to dermatologist.  He has been diagnosed with Folliculitis [place] is insistent that this is not a correct diagnosis.  Ultrasound soft tissues of the neck from 12/28/2019 reviewed.  He has been on multiple rounds of antibiotics which does seem to improve this issue.  He failed Spironolactone for treatment of possible cystic acne. He has seen 2 dermatologist in the past, but neither have given him possible treatment options.  He also reports persistent urine dribbling. This did improve after the Flomax. He is wondering if there is anything else he could take to help improve this issue.  He would also like a Lidocaine cream for his back. He reports Lidocaine patches do not stay on good enough.  He also reports that in the middle of the night, when he gets in a certain position, he will "black out". It will take him a long time to wake up and to be able to use his arms and legs. He has mentioned this to all his providers, and is aware there is nothing that can be done about this. He just wanted to mention it again for his records. He reports he may be moving out of state.  Review of Systems      Past Medical History:  Diagnosis Date  . Frequent headaches   . History of colon polyps   . Hypertension   . Spinal stenosis     Current Outpatient Medications  Medication Sig Dispense Refill  . baclofen (LIORESAL) 10 MG tablet Take 1 tablet (10 mg total) by mouth 2 (two) times daily. (Patient taking differently: Take 20 mg by mouth 2 (two) times daily. ) 180 tablet 3  . diclofenac (CATAFLAM) 50 MG tablet Take 1 tablet (50 mg total) by mouth 3 (three) times daily. 270 tablet 1  . docusate sodium (COLACE) 100 MG capsule Take 100 mg by mouth 2 (two) times daily.      Marland Kitchen gabapentin (NEURONTIN) 600 MG tablet TAKE 1 AND 1/2 TABLETS(900 MG) BY MOUTH THREE TIMES DAILY 405 tablet 0  . loratadine (CLARITIN) 10 MG tablet Take 10 mg by mouth daily.    Marland Kitchen morphine (MS CONTIN) 60 MG 12 hr tablet Take 1 tablet (60 mg total) by mouth 3 (three) times daily. 90 tablet 0  . spironolactone (ALDACTONE) 50 MG tablet TAKE 1 TABLET(50 MG) BY MOUTH DAILY 90 tablet 0  . tamsulosin (FLOMAX) 0.4 MG CAPS capsule TAKE 1 CAPSULE(0.4 MG) BY MOUTH DAILY 90 capsule 0  . VITAMIN D, CHOLECALCIFEROL, PO Take 900 Units by mouth daily.     No current facility-administered medications for this visit.    Allergies  Allergen Reactions  . Bee Venom Swelling  . Poison Ivy Extract [Poison Ivy Extract] Swelling    Family History  Problem Relation Age of Onset  . Cancer Mother        breast  . Alcohol abuse Father   . Heart disease Father   . Heart disease Brother   . Hypertension Brother     Social History   Socioeconomic History  . Marital status: Widowed    Spouse name: Not on file  . Number of children: Not on file  . Years of education: Not on  file  . Highest education level: Not on file  Occupational History  . Not on file  Tobacco Use  . Smoking status: Former Smoker    Quit date: 08/15/2011    Years since quitting: 8.5  . Smokeless tobacco: Never Used  Substance and Sexual Activity  . Alcohol use: No    Alcohol/week: 0.0 standard drinks    Comment: sober--since 2002  . Drug use: No  . Sexual activity: Not Currently  Other Topics Concern  . Not on file  Social History Narrative  . Not on file   Social Determinants of Health   Financial Resource Strain:   . Difficulty of Paying Living Expenses:   Food Insecurity:   . Worried About Charity fundraiser in the Last Year:   . Arboriculturist in the Last Year:   Transportation Needs:   . Film/video editor (Medical):   Marland Kitchen Lack of Transportation (Non-Medical):   Physical Activity:   . Days of Exercise per  Week:   . Minutes of Exercise per Session:   Stress:   . Feeling of Stress :   Social Connections:   . Frequency of Communication with Friends and Family:   . Frequency of Social Gatherings with Friends and Family:   . Attends Religious Services:   . Active Member of Clubs or Organizations:   . Attends Archivist Meetings:   Marland Kitchen Marital Status:   Intimate Partner Violence:   . Fear of Current or Ex-Partner:   . Emotionally Abused:   Marland Kitchen Physically Abused:   . Sexually Abused:      Constitutional: Denies fever, malaise, fatigue, headache or abrupt weight changes.  HEENT: Denies eye pain, eye redness, ear pain, ringing in the ears, wax buildup, runny nose, nasal congestion, bloody nose, or sore throat. Respiratory: Denies difficulty breathing, shortness of breath, cough or sputum production.   Cardiovascular: Denies chest pain, chest tightness, palpitations or swelling in the hands or feet.  Gastrointestinal: Denies abdominal pain, bloating, constipation, diarrhea or blood in the stool.  GU: Pt reports urinary dribbling. Denies urgency, frequency, pain with urination, burning sensation, blood in urine, odor or discharge. Musculoskeletal: Pt reports chronic back pain, decrease in ROM, difficulty with gait. Denies joint swelling.  Skin: Pt reports rash of chin.   No other specific complaints in a complete review of systems (except as listed in HPI above).  Objective:   Physical Exam  BP 120/84   Pulse 81   Temp 97.8 F (36.6 C) (Temporal)   Wt 171 lb (77.6 kg)   SpO2 98%   BMI 27.60 kg/m   Wt Readings from Last 3 Encounters:  12/07/19 175 lb (79.4 kg)  11/16/19 177 lb (80.3 kg)  07/30/18 187 lb (84.8 kg)    General: Appears his stated age, well developed, well nourished in NAD. Skin: Folliculitis noted of chin. Cardiovascular: Normal rate. Pulmonary/Chest: Normal effort. Musculoskeletal: Gait slow and steady with use of rolling walker. Neurological: Alert and  oriented.  Psychiatric: Mood and affect normal. Behavior is normal. Judgment and thought content normal.     BMET    Component Value Date/Time   NA 138 12/07/2019 1103   NA 143 06/11/2017 1257   K 5.1 12/07/2019 1103   CL 99 12/07/2019 1103   CO2 32 12/07/2019 1103   GLUCOSE 85 12/07/2019 1103   BUN 19 12/07/2019 1103   BUN 22 06/11/2017 1257   CREATININE 1.19 12/07/2019 1103   CALCIUM 10.2 12/07/2019  1103   GFRNONAA 49 (L) 06/11/2017 1257   GFRAA 57 (L) 06/11/2017 1257    Lipid Panel     Component Value Date/Time   CHOL 163 12/07/2019 1103   TRIG 195.0 (H) 12/07/2019 1103   HDL 36.00 (L) 12/07/2019 1103   CHOLHDL 5 12/07/2019 1103   VLDL 39.0 12/07/2019 1103   LDLCALC 88 12/07/2019 1103    CBC    Component Value Date/Time   WBC 10.0 12/07/2019 1103   RBC 4.95 12/07/2019 1103   HGB 15.9 12/07/2019 1103   HCT 46.5 12/07/2019 1103   PLT 221.0 12/07/2019 1103   MCV 93.9 12/07/2019 1103   MCH 30.5 01/27/2015 1118   MCHC 34.3 12/07/2019 1103   RDW 13.5 12/07/2019 1103   LYMPHSABS 1.4 01/27/2015 1118   MONOABS 1.0 01/27/2015 1118   EOSABS 0.0 01/27/2015 1118   BASOSABS 0.1 01/27/2015 1118    Hgb A1C No results found for: HGBA1C         Assessment & Plan:  Folliculitis Barbae:  RX for Doxycycline 100 mg BID x 30 days- cautioned against increased sun sensitivity Will request records from dermatology  Chronic Pain:  RX for Diclofenac Gel Continue meds prescribed by pain management  Urine Dribbling:  Advised we could try Finasteride in addition to Flomax, but it may or may not help He wants to continue to monitor at this time  Return precautions discussed   Webb Silversmith, NP This visit occurred during the SARS-CoV-2 public health emergency.  Safety protocols were in place, including screening questions prior to the visit, additional usage of staff PPE, and extensive cleaning of exam room while observing appropriate contact time as indicated for  disinfecting solutions.

## 2020-03-14 NOTE — Patient Instructions (Signed)

## 2020-04-06 ENCOUNTER — Other Ambulatory Visit: Payer: Self-pay | Admitting: Internal Medicine

## 2020-04-11 ENCOUNTER — Telehealth: Payer: Self-pay | Admitting: Internal Medicine

## 2020-04-11 NOTE — Telephone Encounter (Signed)
Pt has moved to [facility] and is in the process of having his local pharmacy there get his current meds transferred from [facility].  He's requesting a refill of the Bacaflen 20mg  3x per day to be sent to [facility] in [place], [place].  Pt does not know the phone #.  Thank you!

## 2020-04-12 NOTE — Telephone Encounter (Signed)
Currently only prescribed 2 x day. I will refill as originally ordered if he would like but not going to increase to 3 x day.

## 2020-05-08 DIAGNOSIS — M542 Cervicalgia: Secondary | ICD-10-CM | POA: Diagnosis not present

## 2020-05-08 DIAGNOSIS — M5440 Lumbago with sciatica, unspecified side: Secondary | ICD-10-CM | POA: Diagnosis not present

## 2020-05-08 DIAGNOSIS — G894 Chronic pain syndrome: Secondary | ICD-10-CM | POA: Diagnosis not present

## 2020-05-08 DIAGNOSIS — G8921 Chronic pain due to trauma: Secondary | ICD-10-CM | POA: Diagnosis not present

## 2020-05-13 ENCOUNTER — Other Ambulatory Visit: Payer: Self-pay | Admitting: Internal Medicine

## 2020-05-16 ENCOUNTER — Telehealth: Payer: Self-pay | Admitting: Internal Medicine

## 2020-05-16 NOTE — Telephone Encounter (Signed)
Ok to refill both meds. Will he be establishing with a new PCP

## 2020-05-16 NOTE — Telephone Encounter (Signed)
Pt called and needs refill on tamsulosin 0.4mg  and gabapentin 600mg  tablets sent to [facility] on [address], [place] [address]  PH# [phone]  Pt is out of both meds

## 2020-05-19 ENCOUNTER — Telehealth: Payer: Self-pay | Admitting: Internal Medicine

## 2020-05-19 NOTE — Telephone Encounter (Signed)
PT is out of morphine and inquiring about getting it refill. Please advise if possible.  He hasn't established a PCP, and he needs it to last him until Wednesday.

## 2020-05-19 NOTE — Telephone Encounter (Signed)
[facility]. I don't feel comfortable having this filled out of state.

## 2020-05-23 DIAGNOSIS — F17200 Nicotine dependence, unspecified, uncomplicated: Secondary | ICD-10-CM | POA: Diagnosis not present

## 2020-05-23 DIAGNOSIS — R7301 Impaired fasting glucose: Secondary | ICD-10-CM | POA: Diagnosis not present

## 2020-05-23 DIAGNOSIS — Z1159 Encounter for screening for other viral diseases: Secondary | ICD-10-CM | POA: Diagnosis not present

## 2020-05-23 DIAGNOSIS — E7849 Other hyperlipidemia: Secondary | ICD-10-CM | POA: Diagnosis not present

## 2020-05-23 DIAGNOSIS — E559 Vitamin D deficiency, unspecified: Secondary | ICD-10-CM | POA: Diagnosis not present

## 2020-05-23 DIAGNOSIS — Z118 Encounter for screening for other infectious and parasitic diseases: Secondary | ICD-10-CM | POA: Diagnosis not present
